# Patient Record
Sex: Male | Born: 1945 | Race: White | Hispanic: No | Marital: Married | Smoking: Former smoker
Health system: Southern US, Community
[De-identification: ages and names within clinical notes are randomized; demographics above are authoritative.]

## PROBLEM LIST (undated history)

## (undated) DIAGNOSIS — I219 Acute myocardial infarction, unspecified: Secondary | ICD-10-CM

## (undated) DIAGNOSIS — I509 Heart failure, unspecified: Secondary | ICD-10-CM

## (undated) DIAGNOSIS — I10 Essential (primary) hypertension: Secondary | ICD-10-CM

## (undated) DIAGNOSIS — E785 Hyperlipidemia, unspecified: Secondary | ICD-10-CM

## (undated) DIAGNOSIS — M1990 Unspecified osteoarthritis, unspecified site: Secondary | ICD-10-CM

## (undated) HISTORY — DX: Hyperlipidemia, unspecified: E78.5

## (undated) HISTORY — DX: Heart failure, unspecified: I50.9

## (undated) HISTORY — DX: Essential (primary) hypertension: I10

## (undated) HISTORY — DX: Unspecified osteoarthritis, unspecified site: M19.90

---

## 2004-11-17 ENCOUNTER — Emergency Department: Payer: Self-pay | Admitting: Emergency Medicine

## 2006-04-25 ENCOUNTER — Ambulatory Visit: Payer: Self-pay

## 2006-05-15 ENCOUNTER — Ambulatory Visit: Payer: Self-pay | Admitting: Surgery

## 2006-08-28 HISTORY — PX: CORONARY ARTERY BYPASS GRAFT: SHX141

## 2006-12-26 ENCOUNTER — Ambulatory Visit: Payer: Self-pay | Admitting: Internal Medicine

## 2006-12-27 ENCOUNTER — Ambulatory Visit: Payer: Self-pay | Admitting: Internal Medicine

## 2006-12-29 ENCOUNTER — Emergency Department: Payer: Self-pay | Admitting: General Practice

## 2006-12-29 ENCOUNTER — Other Ambulatory Visit: Payer: Self-pay

## 2007-03-12 ENCOUNTER — Ambulatory Visit: Payer: Self-pay | Admitting: Internal Medicine

## 2009-09-29 ENCOUNTER — Inpatient Hospital Stay: Payer: Self-pay | Admitting: Internal Medicine

## 2010-03-16 ENCOUNTER — Encounter: Admission: RE | Admit: 2010-03-16 | Discharge: 2010-03-16 | Payer: Self-pay | Admitting: Occupational Medicine

## 2011-02-12 ENCOUNTER — Emergency Department: Payer: Self-pay | Admitting: Emergency Medicine

## 2011-06-02 ENCOUNTER — Ambulatory Visit: Payer: Self-pay | Admitting: Family Medicine

## 2012-03-27 ENCOUNTER — Ambulatory Visit: Payer: Self-pay | Admitting: Family Medicine

## 2015-02-10 ENCOUNTER — Ambulatory Visit (INDEPENDENT_AMBULATORY_CARE_PROVIDER_SITE_OTHER): Payer: Medicare Other | Admitting: Family Medicine

## 2015-02-10 ENCOUNTER — Encounter: Payer: Self-pay | Admitting: Family Medicine

## 2015-02-10 VITALS — BP 142/70 | HR 64 | Temp 98.0°F | Resp 16 | Ht 64.0 in | Wt 179.2 lb

## 2015-02-10 DIAGNOSIS — Z1389 Encounter for screening for other disorder: Secondary | ICD-10-CM

## 2015-02-10 DIAGNOSIS — I480 Paroxysmal atrial fibrillation: Secondary | ICD-10-CM | POA: Insufficient documentation

## 2015-02-10 DIAGNOSIS — E785 Hyperlipidemia, unspecified: Secondary | ICD-10-CM | POA: Insufficient documentation

## 2015-02-10 DIAGNOSIS — Z951 Presence of aortocoronary bypass graft: Secondary | ICD-10-CM | POA: Insufficient documentation

## 2015-02-10 DIAGNOSIS — R600 Localized edema: Secondary | ICD-10-CM | POA: Insufficient documentation

## 2015-02-10 DIAGNOSIS — M1990 Unspecified osteoarthritis, unspecified site: Secondary | ICD-10-CM | POA: Insufficient documentation

## 2015-02-10 DIAGNOSIS — I10 Essential (primary) hypertension: Secondary | ICD-10-CM | POA: Insufficient documentation

## 2015-02-10 DIAGNOSIS — I509 Heart failure, unspecified: Secondary | ICD-10-CM | POA: Insufficient documentation

## 2015-02-10 DIAGNOSIS — J40 Bronchitis, not specified as acute or chronic: Secondary | ICD-10-CM

## 2015-02-10 MED ORDER — HYDROCODONE-HOMATROPINE 5-1.5 MG/5ML PO SYRP: 5.0000 mL | ORAL_SOLUTION | Freq: Four times a day (QID) | ORAL | Status: DC | PRN

## 2015-02-10 MED ORDER — MINOCYCLINE HCL 100 MG PO CAPS
100.0000 mg | ORAL_CAPSULE | Freq: Two times a day (BID) | ORAL | Status: DC
Start: 2015-02-10 — End: 2015-03-10

## 2015-02-10 NOTE — Patient Instructions (Signed)
May add Mucinex

## 2015-02-10 NOTE — Progress Notes (Signed)
Subjective:     Patient ID: DURANTE VIOLETT, male   DOB: 1946-05-16, 69 y.o.   MRN: [id number]  HPI  Chief Complaint  Patient presents with  . Cough    patient comes in office today with concerns of cough for the past month and a half. Associated symptoms include; shortness of breath, runny nose and itching of throat. Patient has tried taking OTC Tylenol and Claritin  Reports cough is productive of purulent to clear sputum. States he has mild clear nasal congestion.    Review of Systems  Constitutional: Negative for fever and chills.       Objective:   Physical Exam Ears: obscured by cerumen Throat: no tonsillar enlargement or exudate Neck: no cervical adenopathy Lungs: clear    Assessment:     1. Bronchitis - minocycline (MINOCIN,DYNACIN) 100 MG capsule; Take 1 capsule (100 mg total) by mouth 2 (two) times daily.  Dispense: 20 capsule; Refill: 0 - HYDROcodone-homatropine (HYCODAN) 5-1.5 MG/5ML syrup; Take 5 mLs by mouth every 6 (six) hours as needed for cough.  Dispense: 240 mL; Refill: 0    Plan:    will f/u HTN in 4 weeks

## 2015-03-10 ENCOUNTER — Ambulatory Visit (INDEPENDENT_AMBULATORY_CARE_PROVIDER_SITE_OTHER): Payer: Medicare Other | Admitting: Family Medicine

## 2015-03-10 ENCOUNTER — Encounter: Payer: Self-pay | Admitting: Family Medicine

## 2015-03-10 VITALS — BP 148/86 | HR 60 | Temp 98.5°F | Resp 16 | Wt 179.4 lb

## 2015-03-10 DIAGNOSIS — J40 Bronchitis, not specified as acute or chronic: Secondary | ICD-10-CM | POA: Diagnosis not present

## 2015-03-10 DIAGNOSIS — E785 Hyperlipidemia, unspecified: Secondary | ICD-10-CM | POA: Diagnosis not present

## 2015-03-10 DIAGNOSIS — Z951 Presence of aortocoronary bypass graft: Secondary | ICD-10-CM

## 2015-03-10 DIAGNOSIS — I10 Essential (primary) hypertension: Secondary | ICD-10-CM | POA: Diagnosis not present

## 2015-03-10 MED ORDER — ATORVASTATIN CALCIUM 40 MG PO TABS: 40.0000 mg | ORAL_TABLET | Freq: Every day | ORAL | Status: AC

## 2015-03-10 MED ORDER — LISINOPRIL 20 MG PO TABS: 20.0000 mg | ORAL_TABLET | Freq: Every day | ORAL | Status: AC

## 2015-03-10 MED ORDER — HYDROCODONE-HOMATROPINE 5-1.5 MG/5ML PO SYRP: 5.0000 mL | ORAL_SOLUTION | Freq: Four times a day (QID) | ORAL | Status: DC | PRN

## 2015-03-10 NOTE — Patient Instructions (Addendum)
Follow up with cardiology. Resume medication daily

## 2015-03-10 NOTE — Progress Notes (Signed)
Subjective:     Patient ID: Patrick Sexton, male   DOB: 1946-06-24, 69 y.o.   MRN: [id number]  HPI  Chief Complaint  Patient presents with  . Bronchitis    patient is present to follow up from last office visit on 6/15. Patient was diagnosed with bronchitis and prescribed Hydrocone-Homatropine and Minocycline 100mg .   . Hypertension    patient is here for four week follow up for blood pressure, last blood pressure in house was 142/70  States his bronchitis has nearly resolved with clearing of purulent sputum Continues to have occasional cough and wishes a refill on cough syrup. Reports he does not take blood pressure or cholesterol medication daily. Prefers to take it every other day as a way of warding off presumed long term side effects.   Review of Systems  Cardiovascular:       Reports increased shortness of breath when taking his trash out. Has not had any recent cardiology evaluation after CABG x 4 in 2008.       Objective:   Physical Exam  Constitutional: He appears well-developed and well-nourished. No distress.  Cardiovascular:  Murmur (2/6 systolic murmur) heard. Pulmonary/Chest: Breath sounds normal.  Musculoskeletal: He exhibits no edema (in lower extremities).       Assessment:    1. HLD (hyperlipidemia) - atorvastatin (LIPITOR) 40 MG tablet; Take 1 tablet (40 mg total) by mouth daily.  Dispense: 30 tablet; Refill: 11  2. Essential hypertension - lisinopril (PRINIVIL,ZESTRIL) 20 MG tablet; Take 1 tablet (20 mg total) by mouth daily.  Dispense: 30 tablet; Refill: 5  3. Bronchitis - HYDROcodone-homatropine (HYCODAN) 5-1.5 MG/5ML syrup; Take 5 mLs by mouth every 6 (six) hours as needed for cough.  Dispense: 120 mL; Refill: 0  4. H/O coronary artery bypass surgery - Ambulatory referral to Cardiology     Plan:    Resume daily medication

## 2015-05-19 ENCOUNTER — Telehealth: Payer: Self-pay | Admitting: Family Medicine

## 2015-05-19 ENCOUNTER — Encounter: Payer: Medicare Other | Admitting: Family Medicine

## 2015-05-26 ENCOUNTER — Encounter: Payer: Self-pay | Admitting: Family Medicine

## 2015-05-26 ENCOUNTER — Ambulatory Visit (INDEPENDENT_AMBULATORY_CARE_PROVIDER_SITE_OTHER): Payer: Medicare Other | Admitting: Family Medicine

## 2015-05-26 VITALS — BP 120/62 | HR 57 | Temp 98.2°F | Resp 14 | Wt 175.6 lb

## 2015-05-26 DIAGNOSIS — J069 Acute upper respiratory infection, unspecified: Secondary | ICD-10-CM

## 2015-05-26 MED ORDER — HYDROCODONE-HOMATROPINE 5-1.5 MG/5ML PO SYRP: 5.0000 mL | ORAL_SOLUTION | Freq: Four times a day (QID) | ORAL | Status: AC | PRN

## 2015-05-26 NOTE — Patient Instructions (Signed)
May also use Delsym for cough.

## 2015-05-26 NOTE — Progress Notes (Signed)
Subjective:     Patient ID: Patrick Sexton, male   DOB: [birth year], 69 y.o.   MRN: [id number]  HPI  Chief Complaint  Patient presents with  . Cough    Patient comes in office today with concerns of cough and congestion for the past 4-5 days. Patient states that cough is productive and he has had shortness of breath. Patient has tried otc cough drops  Reports onset of cold sx 4-5 days ago.   Review of Systems  Constitutional: Negative for fever and chills.       Objective:   Physical Exam  Constitutional: He appears well-developed and well-nourished. No distress.  Ears: T.M's intact without inflammation Throat: tonsils absent, no erythema Neck: no cervical adenopathy Lungs: clear     Assessment:    1. Upper respiratory infection - HYDROcodone-homatropine (HYCODAN) 5-1.5 MG/5ML syrup; Take 5 mLs by mouth every 6 (six) hours as needed for cough.  Dispense: 240 mL; Refill: 0    Plan:    Discussed natural hx of URI. Will call if not continuing to improve.

## 2015-09-04 ENCOUNTER — Emergency Department (HOSPITAL_COMMUNITY): Payer: Medicare Other

## 2015-09-04 ENCOUNTER — Encounter (HOSPITAL_COMMUNITY): Payer: Self-pay | Admitting: Emergency Medicine

## 2015-09-04 ENCOUNTER — Inpatient Hospital Stay (HOSPITAL_COMMUNITY): Payer: Medicare Other

## 2015-09-04 ENCOUNTER — Encounter (HOSPITAL_COMMUNITY): Admission: EM | Disposition: E | Payer: Self-pay | Source: Home / Self Care | Attending: Pulmonary Disease

## 2015-09-04 ENCOUNTER — Inpatient Hospital Stay (HOSPITAL_COMMUNITY)
Admission: EM | Admit: 2015-09-04 | Discharge: 2015-09-29 | DRG: 286 | Disposition: E | Payer: Medicare Other | Attending: Internal Medicine | Admitting: Internal Medicine

## 2015-09-04 DIAGNOSIS — E785 Hyperlipidemia, unspecified: Secondary | ICD-10-CM | POA: Diagnosis present

## 2015-09-04 DIAGNOSIS — J930 Spontaneous tension pneumothorax: Secondary | ICD-10-CM | POA: Diagnosis not present

## 2015-09-04 DIAGNOSIS — I25810 Atherosclerosis of coronary artery bypass graft(s) without angina pectoris: Secondary | ICD-10-CM | POA: Diagnosis present

## 2015-09-04 DIAGNOSIS — R7989 Other specified abnormal findings of blood chemistry: Secondary | ICD-10-CM | POA: Diagnosis not present

## 2015-09-04 DIAGNOSIS — G931 Anoxic brain damage, not elsewhere classified: Secondary | ICD-10-CM | POA: Diagnosis present

## 2015-09-04 DIAGNOSIS — E1165 Type 2 diabetes mellitus with hyperglycemia: Secondary | ICD-10-CM | POA: Diagnosis present

## 2015-09-04 DIAGNOSIS — E872 Acidosis, unspecified: Secondary | ICD-10-CM | POA: Insufficient documentation

## 2015-09-04 DIAGNOSIS — E0781 Sick-euthyroid syndrome: Secondary | ICD-10-CM | POA: Diagnosis present

## 2015-09-04 DIAGNOSIS — E874 Mixed disorder of acid-base balance: Secondary | ICD-10-CM | POA: Diagnosis not present

## 2015-09-04 DIAGNOSIS — N179 Acute kidney failure, unspecified: Secondary | ICD-10-CM | POA: Diagnosis not present

## 2015-09-04 DIAGNOSIS — D649 Anemia, unspecified: Secondary | ICD-10-CM | POA: Diagnosis not present

## 2015-09-04 DIAGNOSIS — G9340 Encephalopathy, unspecified: Secondary | ICD-10-CM | POA: Diagnosis present

## 2015-09-04 DIAGNOSIS — I462 Cardiac arrest due to underlying cardiac condition: Secondary | ICD-10-CM | POA: Diagnosis present

## 2015-09-04 DIAGNOSIS — E870 Hyperosmolality and hypernatremia: Secondary | ICD-10-CM | POA: Diagnosis not present

## 2015-09-04 DIAGNOSIS — I481 Persistent atrial fibrillation: Secondary | ICD-10-CM | POA: Diagnosis not present

## 2015-09-04 DIAGNOSIS — Z452 Encounter for adjustment and management of vascular access device: Secondary | ICD-10-CM

## 2015-09-04 DIAGNOSIS — E876 Hypokalemia: Secondary | ICD-10-CM | POA: Diagnosis not present

## 2015-09-04 DIAGNOSIS — I447 Left bundle-branch block, unspecified: Secondary | ICD-10-CM | POA: Diagnosis present

## 2015-09-04 DIAGNOSIS — J9801 Acute bronchospasm: Secondary | ICD-10-CM | POA: Diagnosis not present

## 2015-09-04 DIAGNOSIS — I2582 Chronic total occlusion of coronary artery: Secondary | ICD-10-CM | POA: Diagnosis present

## 2015-09-04 DIAGNOSIS — T17990A Other foreign object in respiratory tract, part unspecified in causing asphyxiation, initial encounter: Secondary | ICD-10-CM | POA: Diagnosis not present

## 2015-09-04 DIAGNOSIS — I5043 Acute on chronic combined systolic (congestive) and diastolic (congestive) heart failure: Secondary | ICD-10-CM | POA: Diagnosis present

## 2015-09-04 DIAGNOSIS — D473 Essential (hemorrhagic) thrombocythemia: Secondary | ICD-10-CM | POA: Diagnosis not present

## 2015-09-04 DIAGNOSIS — I130 Hypertensive heart and chronic kidney disease with heart failure and stage 1 through stage 4 chronic kidney disease, or unspecified chronic kidney disease: Secondary | ICD-10-CM | POA: Diagnosis present

## 2015-09-04 DIAGNOSIS — J151 Pneumonia due to Pseudomonas: Secondary | ICD-10-CM | POA: Diagnosis not present

## 2015-09-04 DIAGNOSIS — J9600 Acute respiratory failure, unspecified whether with hypoxia or hypercapnia: Secondary | ICD-10-CM | POA: Diagnosis not present

## 2015-09-04 DIAGNOSIS — Z87891 Personal history of nicotine dependence: Secondary | ICD-10-CM

## 2015-09-04 DIAGNOSIS — I63133 Cerebral infarction due to embolism of bilateral carotid arteries: Secondary | ICD-10-CM | POA: Diagnosis not present

## 2015-09-04 DIAGNOSIS — E878 Other disorders of electrolyte and fluid balance, not elsewhere classified: Secondary | ICD-10-CM | POA: Diagnosis not present

## 2015-09-04 DIAGNOSIS — R402312 Coma scale, best motor response, none, at arrival to emergency department: Secondary | ICD-10-CM | POA: Diagnosis present

## 2015-09-04 DIAGNOSIS — E2740 Unspecified adrenocortical insufficiency: Secondary | ICD-10-CM | POA: Diagnosis not present

## 2015-09-04 DIAGNOSIS — K7200 Acute and subacute hepatic failure without coma: Secondary | ICD-10-CM | POA: Diagnosis not present

## 2015-09-04 DIAGNOSIS — R001 Bradycardia, unspecified: Secondary | ICD-10-CM | POA: Diagnosis not present

## 2015-09-04 DIAGNOSIS — J690 Pneumonitis due to inhalation of food and vomit: Secondary | ICD-10-CM | POA: Diagnosis not present

## 2015-09-04 DIAGNOSIS — I480 Paroxysmal atrial fibrillation: Secondary | ICD-10-CM | POA: Diagnosis present

## 2015-09-04 DIAGNOSIS — R6521 Severe sepsis with septic shock: Secondary | ICD-10-CM | POA: Diagnosis not present

## 2015-09-04 DIAGNOSIS — J9611 Chronic respiratory failure with hypoxia: Secondary | ICD-10-CM | POA: Diagnosis not present

## 2015-09-04 DIAGNOSIS — I469 Cardiac arrest, cause unspecified: Secondary | ICD-10-CM | POA: Diagnosis present

## 2015-09-04 DIAGNOSIS — A419 Sepsis, unspecified organism: Secondary | ICD-10-CM | POA: Diagnosis not present

## 2015-09-04 DIAGNOSIS — R319 Hematuria, unspecified: Secondary | ICD-10-CM | POA: Diagnosis not present

## 2015-09-04 DIAGNOSIS — R570 Cardiogenic shock: Secondary | ICD-10-CM | POA: Diagnosis not present

## 2015-09-04 DIAGNOSIS — I471 Supraventricular tachycardia: Secondary | ICD-10-CM | POA: Diagnosis present

## 2015-09-04 DIAGNOSIS — J189 Pneumonia, unspecified organism: Secondary | ICD-10-CM

## 2015-09-04 DIAGNOSIS — Z66 Do not resuscitate: Secondary | ICD-10-CM

## 2015-09-04 DIAGNOSIS — R778 Other specified abnormalities of plasma proteins: Secondary | ICD-10-CM | POA: Insufficient documentation

## 2015-09-04 DIAGNOSIS — I10 Essential (primary) hypertension: Secondary | ICD-10-CM | POA: Diagnosis present

## 2015-09-04 DIAGNOSIS — I2510 Atherosclerotic heart disease of native coronary artery without angina pectoris: Secondary | ICD-10-CM | POA: Diagnosis present

## 2015-09-04 DIAGNOSIS — Z9689 Presence of other specified functional implants: Secondary | ICD-10-CM

## 2015-09-04 DIAGNOSIS — R402212 Coma scale, best verbal response, none, at arrival to emergency department: Secondary | ICD-10-CM | POA: Diagnosis present

## 2015-09-04 DIAGNOSIS — J150 Pneumonia due to Klebsiella pneumoniae: Secondary | ICD-10-CM | POA: Diagnosis not present

## 2015-09-04 DIAGNOSIS — Z6829 Body mass index (BMI) 29.0-29.9, adult: Secondary | ICD-10-CM | POA: Diagnosis not present

## 2015-09-04 DIAGNOSIS — Z978 Presence of other specified devices: Secondary | ICD-10-CM

## 2015-09-04 DIAGNOSIS — J9690 Respiratory failure, unspecified, unspecified whether with hypoxia or hypercapnia: Secondary | ICD-10-CM | POA: Insufficient documentation

## 2015-09-04 DIAGNOSIS — I5023 Acute on chronic systolic (congestive) heart failure: Secondary | ICD-10-CM | POA: Diagnosis not present

## 2015-09-04 DIAGNOSIS — R748 Abnormal levels of other serum enzymes: Secondary | ICD-10-CM | POA: Diagnosis present

## 2015-09-04 DIAGNOSIS — J9811 Atelectasis: Secondary | ICD-10-CM | POA: Diagnosis not present

## 2015-09-04 DIAGNOSIS — R0902 Hypoxemia: Secondary | ICD-10-CM

## 2015-09-04 DIAGNOSIS — E871 Hypo-osmolality and hyponatremia: Secondary | ICD-10-CM | POA: Diagnosis not present

## 2015-09-04 DIAGNOSIS — I6310 Cerebral infarction due to embolism of unspecified precerebral artery: Secondary | ICD-10-CM | POA: Diagnosis not present

## 2015-09-04 DIAGNOSIS — Z515 Encounter for palliative care: Secondary | ICD-10-CM | POA: Diagnosis not present

## 2015-09-04 DIAGNOSIS — Z951 Presence of aortocoronary bypass graft: Secondary | ICD-10-CM

## 2015-09-04 DIAGNOSIS — Z79899 Other long term (current) drug therapy: Secondary | ICD-10-CM

## 2015-09-04 DIAGNOSIS — Z7982 Long term (current) use of aspirin: Secondary | ICD-10-CM | POA: Diagnosis not present

## 2015-09-04 DIAGNOSIS — I252 Old myocardial infarction: Secondary | ICD-10-CM

## 2015-09-04 DIAGNOSIS — J9601 Acute respiratory failure with hypoxia: Secondary | ICD-10-CM | POA: Diagnosis not present

## 2015-09-04 DIAGNOSIS — Z938 Other artificial opening status: Secondary | ICD-10-CM

## 2015-09-04 DIAGNOSIS — I255 Ischemic cardiomyopathy: Secondary | ICD-10-CM | POA: Diagnosis present

## 2015-09-04 DIAGNOSIS — R34 Anuria and oliguria: Secondary | ICD-10-CM | POA: Diagnosis not present

## 2015-09-04 DIAGNOSIS — Z4659 Encounter for fitting and adjustment of other gastrointestinal appliance and device: Secondary | ICD-10-CM

## 2015-09-04 DIAGNOSIS — N182 Chronic kidney disease, stage 2 (mild): Secondary | ICD-10-CM | POA: Diagnosis present

## 2015-09-04 DIAGNOSIS — J9621 Acute and chronic respiratory failure with hypoxia: Secondary | ICD-10-CM | POA: Diagnosis not present

## 2015-09-04 DIAGNOSIS — R64 Cachexia: Secondary | ICD-10-CM | POA: Diagnosis present

## 2015-09-04 DIAGNOSIS — I4901 Ventricular fibrillation: Principal | ICD-10-CM | POA: Diagnosis present

## 2015-09-04 DIAGNOSIS — I6330 Cerebral infarction due to thrombosis of unspecified cerebral artery: Secondary | ICD-10-CM | POA: Insufficient documentation

## 2015-09-04 DIAGNOSIS — R402112 Coma scale, eyes open, never, at arrival to emergency department: Secondary | ICD-10-CM | POA: Diagnosis present

## 2015-09-04 DIAGNOSIS — I4891 Unspecified atrial fibrillation: Secondary | ICD-10-CM | POA: Diagnosis not present

## 2015-09-04 DIAGNOSIS — Z9289 Personal history of other medical treatment: Secondary | ICD-10-CM

## 2015-09-04 HISTORY — PX: CARDIAC CATHETERIZATION: SHX172

## 2015-09-04 HISTORY — DX: Acute myocardial infarction, unspecified: I21.9

## 2015-09-04 LAB — PROTIME-INR
INR: 1.38 (ref 0.00–1.49)
INR: 1.65 — ABNORMAL HIGH (ref 0.00–1.49)
Prothrombin Time: 17.1 seconds — ABNORMAL HIGH (ref 11.6–15.2)
Prothrombin Time: 19.5 seconds — ABNORMAL HIGH (ref 11.6–15.2)

## 2015-09-04 LAB — CBC WITH DIFFERENTIAL/PLATELET
BASOS ABS: 0.1 10*3/uL (ref 0.0–0.1)
Basophils Relative: 0 %
EOS ABS: 0.4 10*3/uL (ref 0.0–0.7)
EOS PCT: 2 %
HCT: 40.6 % (ref 39.0–52.0)
HEMOGLOBIN: 13 g/dL (ref 13.0–17.0)
Lymphocytes Relative: 10 %
Lymphs Abs: 2 10*3/uL (ref 0.7–4.0)
MCH: 25.9 pg — ABNORMAL LOW (ref 26.0–34.0)
MCHC: 32 g/dL (ref 30.0–36.0)
MCV: 80.9 fL (ref 78.0–100.0)
Monocytes Absolute: 1 10*3/uL (ref 0.1–1.0)
Monocytes Relative: 5 %
NEUTROS PCT: 83 %
Neutro Abs: 17.9 10*3/uL — ABNORMAL HIGH (ref 1.7–7.7)
PLATELETS: 411 10*3/uL — AB (ref 150–400)
RBC: 5.02 MIL/uL (ref 4.22–5.81)
RDW: 14.8 % (ref 11.5–15.5)
WBC: 21.3 10*3/uL — AB (ref 4.0–10.5)

## 2015-09-04 LAB — POCT I-STAT 3, ART BLOOD GAS (G3+)
ACID-BASE DEFICIT: 10 mmol/L — AB (ref 0.0–2.0)
ACID-BASE DEFICIT: 10 mmol/L — AB (ref 0.0–2.0)
Acid-base deficit: 10 mmol/L — ABNORMAL HIGH (ref 0.0–2.0)
Bicarbonate: 18.6 mEq/L — ABNORMAL LOW (ref 20.0–24.0)
Bicarbonate: 16 mEq/L — ABNORMAL LOW (ref 20.0–24.0)
Bicarbonate: 14.9 mEq/L — ABNORMAL LOW (ref 20.0–24.0)
O2 SAT: 86 %
O2 SAT: 90 %
O2 SAT: 97 %
PCO2 ART: 49.4 mmHg — AB (ref 35.0–45.0)
PCO2 ART: 25.2 mmHg — AB (ref 35.0–45.0)
PH ART: 7.184 — AB (ref 7.350–7.450)
PH ART: 7.361 (ref 7.350–7.450)
PO2 ART: 57 mmHg — AB (ref 80.0–100.0)
TCO2: 20 mmol/L (ref 0–100)
TCO2: 17 mmol/L (ref 0–100)
TCO2: 16 mmol/L (ref 0–100)
pCO2 arterial: 31.7 mmHg — ABNORMAL LOW (ref 35.0–45.0)
pH, Arterial: 7.3 — ABNORMAL LOW (ref 7.350–7.450)
pO2, Arterial: 64 mmHg — ABNORMAL LOW (ref 80.0–100.0)
pO2, Arterial: 76 mmHg — ABNORMAL LOW (ref 80.0–100.0)

## 2015-09-04 LAB — CBC
HCT: 46.1 % (ref 39.0–52.0)
Hemoglobin: 14.6 g/dL (ref 13.0–17.0)
MCH: 25.5 pg — AB (ref 26.0–34.0)
MCHC: 31.7 g/dL (ref 30.0–36.0)
MCV: 80.5 fL (ref 78.0–100.0)
PLATELETS: 471 10*3/uL — AB (ref 150–400)
RBC: 5.73 MIL/uL (ref 4.22–5.81)
RDW: 14.7 % (ref 11.5–15.5)
WBC: 17.9 10*3/uL — AB (ref 4.0–10.5)

## 2015-09-04 LAB — BASIC METABOLIC PANEL
ANION GAP: 13 (ref 5–15)
Anion gap: 11 (ref 5–15)
BUN: 19 mg/dL (ref 6–20)
BUN: 20 mg/dL (ref 6–20)
CHLORIDE: 107 mmol/L (ref 101–111)
CO2: 17 mmol/L — ABNORMAL LOW (ref 22–32)
CO2: 22 mmol/L (ref 22–32)
Calcium: 7.9 mg/dL — ABNORMAL LOW (ref 8.9–10.3)
Calcium: 7.4 mg/dL — ABNORMAL LOW (ref 8.9–10.3)
Chloride: 104 mmol/L (ref 101–111)
Creatinine, Ser: 0.88 mg/dL (ref 0.61–1.24)
Creatinine, Ser: 1.09 mg/dL (ref 0.61–1.24)
Glucose, Bld: 218 mg/dL — ABNORMAL HIGH (ref 65–99)
Glucose, Bld: 311 mg/dL — ABNORMAL HIGH (ref 65–99)
POTASSIUM: 3.5 mmol/L (ref 3.5–5.1)
POTASSIUM: 3.4 mmol/L — AB (ref 3.5–5.1)
SODIUM: 137 mmol/L (ref 135–145)
SODIUM: 137 mmol/L (ref 135–145)

## 2015-09-04 LAB — APTT: aPTT: 156 seconds — ABNORMAL HIGH (ref 24–37)

## 2015-09-04 LAB — COMPREHENSIVE METABOLIC PANEL WITH GFR
ALT: 137 U/L — ABNORMAL HIGH (ref 17–63)
AST: 145 U/L — ABNORMAL HIGH (ref 15–41)
Albumin: 3.2 g/dL — ABNORMAL LOW (ref 3.5–5.0)
Alkaline Phosphatase: 87 U/L (ref 38–126)
Anion gap: 16 — ABNORMAL HIGH (ref 5–15)
BUN: 20 mg/dL (ref 6–20)
CO2: 18 mmol/L — ABNORMAL LOW (ref 22–32)
Calcium: 8.7 mg/dL — ABNORMAL LOW (ref 8.9–10.3)
Chloride: 105 mmol/L (ref 101–111)
Creatinine, Ser: 1.19 mg/dL (ref 0.61–1.24)
GFR calc Af Amer: >60 mL/min
GFR calc non Af Amer: >60 mL/min
Glucose, Bld: 172 mg/dL — ABNORMAL HIGH (ref 65–99)
Potassium: 4.4 mmol/L (ref 3.5–5.1)
Sodium: 139 mmol/L (ref 135–145)
Total Bilirubin: 0.8 mg/dL (ref 0.3–1.2)
Total Protein: 6.3 g/dL — ABNORMAL LOW (ref 6.5–8.1)

## 2015-09-04 LAB — I-STAT CHEM 8, ED
BUN: 25 mg/dL — ABNORMAL HIGH (ref 6–20)
Calcium, Ion: 1.12 mmol/L — ABNORMAL LOW (ref 1.13–1.30)
Chloride: 105 mmol/L (ref 101–111)
Creatinine, Ser: 1 mg/dL (ref 0.61–1.24)
Glucose, Bld: 171 mg/dL — ABNORMAL HIGH (ref 65–99)
HCT: 43 % (ref 39.0–52.0)
Hemoglobin: 14.6 g/dL (ref 13.0–17.0)
Potassium: 4.3 mmol/L (ref 3.5–5.1)
Sodium: 140 mmol/L (ref 135–145)
TCO2: 20 mmol/L (ref 0–100)

## 2015-09-04 LAB — I-STAT ARTERIAL BLOOD GAS, ED
Acid-base deficit: 8 mmol/L — ABNORMAL HIGH (ref 0.0–2.0)
Bicarbonate: 19 meq/L — ABNORMAL LOW (ref 20.0–24.0)
O2 Saturation: 99 %
TCO2: 20 mmol/L (ref 0–100)
pCO2 arterial: 44.9 mmHg (ref 35.0–45.0)
pH, Arterial: 7.235 — ABNORMAL LOW (ref 7.350–7.450)
pO2, Arterial: 192 mmHg — ABNORMAL HIGH (ref 80.0–100.0)

## 2015-09-04 LAB — I-STAT TROPONIN, ED: TROPONIN I, POC: 0.14 ng/mL — AB (ref 0.00–0.08)

## 2015-09-04 LAB — BRAIN NATRIURETIC PEPTIDE: B Natriuretic Peptide: 235.5 pg/mL — ABNORMAL HIGH (ref 0.0–100.0)

## 2015-09-04 LAB — POCT ACTIVATED CLOTTING TIME: ACTIVATED CLOTTING TIME: 239 s

## 2015-09-04 LAB — I-STAT CG4 LACTIC ACID, ED: LACTIC ACID, VENOUS: 7.63 mmol/L — AB (ref 0.5–2.0)

## 2015-09-04 LAB — PHOSPHORUS: PHOSPHORUS: 5.9 mg/dL — AB (ref 2.5–4.6)

## 2015-09-04 LAB — PROCALCITONIN: Procalcitonin: 0.29 ng/mL

## 2015-09-04 LAB — CORTISOL: Cortisol, Plasma: 11.8 ug/dL

## 2015-09-04 LAB — MAGNESIUM: Magnesium: 1.8 mg/dL (ref 1.7–2.4)

## 2015-09-04 LAB — STREP PNEUMONIAE URINARY ANTIGEN: STREP PNEUMO URINARY ANTIGEN: NEGATIVE

## 2015-09-04 LAB — LACTIC ACID, PLASMA: LACTIC ACID, VENOUS: 3.5 mmol/L — AB (ref 0.5–2.0)

## 2015-09-04 LAB — TROPONIN I
TROPONIN I: 5.96 ng/mL — AB (ref <0.031)
TROPONIN I: 16.91 ng/mL — AB (ref <0.031)

## 2015-09-04 SURGERY — LEFT HEART CATH AND CORS/GRAFTS ANGIOGRAPHY

## 2015-09-04 MED ORDER — FENTANYL CITRATE (PF) 100 MCG/2ML IJ SOLN: 50.0000 ug | INTRAMUSCULAR | Status: DC | PRN

## 2015-09-04 MED ORDER — SODIUM CHLORIDE 0.9 % IV SOLN
25.0000 ug/h | INTRAVENOUS | Status: DC
  Administered 2015-09-04: 50 ug/h via INTRAVENOUS
  Administered 2015-09-05: 150 ug/h via INTRAVENOUS
  Administered 2015-09-06: 250 ug/h via INTRAVENOUS
  Filled 2015-09-04 (×4): qty 50

## 2015-09-04 MED ORDER — SODIUM CHLORIDE 0.9 % IV SOLN
1.0000 ug/kg/min | INTRAVENOUS | Status: DC
  Administered 2015-09-04 – 2015-09-05 (×2): 1 ug/kg/min via INTRAVENOUS
  Filled 2015-09-04 (×2): qty 20

## 2015-09-04 MED ORDER — MIDAZOLAM HCL 2 MG/2ML IJ SOLN: 1.0000 mg | INTRAMUSCULAR | Status: DC | PRN

## 2015-09-04 MED ORDER — SODIUM CHLORIDE 0.9 % IJ SOLN: 3.0000 mL | INTRAMUSCULAR | Status: DC | PRN

## 2015-09-04 MED ORDER — ARTIFICIAL TEARS OP OINT
1.0000 | TOPICAL_OINTMENT | Freq: Three times a day (TID) | OPHTHALMIC | Status: DC
Start: 2015-09-04 — End: 2015-09-04

## 2015-09-04 MED ORDER — POTASSIUM CHLORIDE 10 MEQ/50ML IV SOLN
10.0000 meq | INTRAVENOUS | Status: AC
  Administered 2015-09-04 (×2): 10 meq via INTRAVENOUS
  Filled 2015-09-04 (×2): qty 50

## 2015-09-04 MED ORDER — FENTANYL CITRATE (PF) 100 MCG/2ML IJ SOLN
50.0000 ug | Freq: Once | INTRAMUSCULAR | Status: DC
Start: 2015-09-04 — End: 2015-09-06

## 2015-09-04 MED ORDER — AMIODARONE HCL IN DEXTROSE 360-4.14 MG/200ML-% IV SOLN
60.0000 mg/h | INTRAVENOUS | Status: AC
  Administered 2015-09-04: 60 mg/h via INTRAVENOUS
  Filled 2015-09-04 (×2): qty 200

## 2015-09-04 MED ORDER — LIDOCAINE HCL (PF) 1 % IJ SOLN
INTRAMUSCULAR | Status: AC
  Filled 2015-09-04: qty 30

## 2015-09-04 MED ORDER — CISATRACURIUM BOLUS VIA INFUSION
0.0500 mg/kg | INTRAVENOUS | Status: DC | PRN
  Filled 2015-09-04: qty 4

## 2015-09-04 MED ORDER — HEPARIN SODIUM (PORCINE) 1000 UNIT/ML IJ SOLN
INTRAMUSCULAR | Status: AC
  Filled 2015-09-04: qty 1

## 2015-09-04 MED ORDER — ASPIRIN 81 MG PO CHEW
81.0000 mg | CHEWABLE_TABLET | Freq: Every day | ORAL | Status: DC
  Administered 2015-09-04 – 2015-09-21 (×18): 81 mg via ORAL
  Filled 2015-09-04 (×18): qty 1

## 2015-09-04 MED ORDER — PROPOFOL 1000 MG/100ML IV EMUL: 5.0000 ug/kg/min | Freq: Once | INTRAVENOUS | Status: DC

## 2015-09-04 MED ORDER — MIDAZOLAM HCL 2 MG/2ML IJ SOLN: 1.0000 mg | Freq: Once | INTRAMUSCULAR | Status: DC

## 2015-09-04 MED ORDER — MIDAZOLAM HCL 5 MG/ML IJ SOLN
1.0000 mg/h | INTRAMUSCULAR | Status: DC
  Administered 2015-09-04: 1 mg/h via INTRAVENOUS
  Administered 2015-09-05: 3 mg/h via INTRAVENOUS
  Administered 2015-09-06: 4 mg/h via INTRAVENOUS
  Filled 2015-09-04 (×4): qty 10

## 2015-09-04 MED ORDER — PANTOPRAZOLE SODIUM 40 MG IV SOLR
40.0000 mg | Freq: Every day | INTRAVENOUS | Status: DC
  Administered 2015-09-04 – 2015-09-07 (×4): 40 mg via INTRAVENOUS
  Filled 2015-09-04 (×4): qty 40

## 2015-09-04 MED ORDER — MIDAZOLAM BOLUS VIA INFUSION
1.0000 mg | INTRAVENOUS | Status: DC | PRN
  Filled 2015-09-04: qty 1

## 2015-09-04 MED ORDER — SODIUM CHLORIDE 0.9 % IJ SOLN
3.0000 mL | Freq: Two times a day (BID) | INTRAMUSCULAR | Status: DC
  Administered 2015-09-04 – 2015-09-10 (×9): 3 mL via INTRAVENOUS
  Administered 2015-09-11: 10 mL via INTRAVENOUS

## 2015-09-04 MED ORDER — NOREPINEPHRINE BITARTRATE 1 MG/ML IV SOLN
0.0000 ug/min | INTRAVENOUS | Status: DC
  Administered 2015-09-04: 37 ug/min via INTRAVENOUS
  Administered 2015-09-05 (×2): 36 ug/min via INTRAVENOUS
  Administered 2015-09-06: 41 ug/min via INTRAVENOUS
  Administered 2015-09-06: 26 ug/min via INTRAVENOUS
  Administered 2015-09-07: 6 ug/min via INTRAVENOUS
  Filled 2015-09-04 (×9): qty 16

## 2015-09-04 MED ORDER — SODIUM CHLORIDE 0.9 % IV SOLN: 2000.0000 mL | Freq: Once | INTRAVENOUS | Status: DC

## 2015-09-04 MED ORDER — NOREPINEPHRINE BITARTRATE 1 MG/ML IV SOLN
0.0000 ug/min | INTRAVENOUS | Status: DC
  Filled 2015-09-04: qty 4

## 2015-09-04 MED ORDER — METOPROLOL TARTRATE 1 MG/ML IV SOLN
INTRAVENOUS | Status: AC
  Filled 2015-09-04: qty 5

## 2015-09-04 MED ORDER — HEPARIN SODIUM (PORCINE) 1000 UNIT/ML IJ SOLN
INTRAMUSCULAR | Status: DC | PRN
  Administered 2015-09-04: 6000 [IU] via INTRAVENOUS

## 2015-09-04 MED ORDER — NOREPINEPHRINE BITARTRATE 1 MG/ML IV SOLN
0.0000 ug/min | INTRAVENOUS | Status: DC
  Administered 2015-09-04: 12 ug/min via INTRAVENOUS
  Filled 2015-09-04: qty 4

## 2015-09-04 MED ORDER — CISATRACURIUM BESYLATE (PF) 200 MG/20ML IV SOLN
1.0000 ug/kg/min | INTRAVENOUS | Status: DC
  Filled 2015-09-04: qty 20

## 2015-09-04 MED ORDER — LIDOCAINE HCL (PF) 1 % IJ SOLN
INTRAMUSCULAR | Status: DC | PRN
  Administered 2015-09-04: 18:00:00

## 2015-09-04 MED ORDER — AMIODARONE HCL 150 MG/3ML IV SOLN
INTRAVENOUS | Status: AC
  Filled 2015-09-04: qty 3

## 2015-09-04 MED ORDER — IOHEXOL 350 MG/ML SOLN
INTRAVENOUS | Status: DC | PRN
  Administered 2015-09-04: 135 mL via INTRAVENOUS

## 2015-09-04 MED ORDER — ASPIRIN 300 MG RE SUPP
300.0000 mg | RECTAL | Status: AC
  Administered 2015-09-05: 300 mg via RECTAL
  Filled 2015-09-04 (×2): qty 1

## 2015-09-04 MED ORDER — AMIODARONE HCL 150 MG/3ML IV SOLN
INTRAVENOUS | Status: DC | PRN
  Administered 2015-09-04 (×2): 150 mg via INTRAVENOUS

## 2015-09-04 MED ORDER — SODIUM CHLORIDE 0.9 % IV SOLN
1.0000 mg/h | INTRAVENOUS | Status: DC
  Filled 2015-09-04: qty 10

## 2015-09-04 MED ORDER — SODIUM CHLORIDE 0.9 % IV SOLN
250.0000 mL | INTRAVENOUS | Status: DC | PRN
  Administered 2015-09-11: 250 mL via INTRAVENOUS

## 2015-09-04 MED ORDER — CHLORHEXIDINE GLUCONATE 0.12% ORAL RINSE (MEDLINE KIT)
15.0000 mL | Freq: Two times a day (BID) | OROMUCOSAL | Status: DC
  Administered 2015-09-04 – 2015-09-21 (×34): 15 mL via OROMUCOSAL

## 2015-09-04 MED ORDER — ROCURONIUM BROMIDE 50 MG/5ML IV SOLN
100.0000 mg | Freq: Once | INTRAVENOUS | Status: AC
  Administered 2015-09-04: 100 mg via INTRAVENOUS

## 2015-09-04 MED ORDER — SODIUM BICARBONATE 8.4 % IV SOLN
INTRAVENOUS | Status: DC
  Administered 2015-09-04 – 2015-09-05 (×3): via INTRAVENOUS
  Filled 2015-09-04 (×8): qty 150

## 2015-09-04 MED ORDER — PROPOFOL 1000 MG/100ML IV EMUL
INTRAVENOUS | Status: AC
  Filled 2015-09-04: qty 100

## 2015-09-04 MED ORDER — HEPARIN SODIUM (PORCINE) 5000 UNIT/ML IJ SOLN
5000.0000 [IU] | Freq: Three times a day (TID) | INTRAMUSCULAR | Status: DC
  Filled 2015-09-04: qty 1

## 2015-09-04 MED ORDER — FENTANYL CITRATE (PF) 100 MCG/2ML IJ SOLN: 50.0000 ug | Freq: Once | INTRAMUSCULAR | Status: DC

## 2015-09-04 MED ORDER — ARTIFICIAL TEARS OP OINT
1.0000 "application " | TOPICAL_OINTMENT | Freq: Three times a day (TID) | OPHTHALMIC | Status: DC
  Administered 2015-09-04 – 2015-09-06 (×5): 1 via OPHTHALMIC
  Filled 2015-09-04 (×2): qty 3.5

## 2015-09-04 MED ORDER — ANTISEPTIC ORAL RINSE SOLUTION (CORINZ)
7.0000 mL | OROMUCOSAL | Status: DC
  Administered 2015-09-04 – 2015-09-21 (×163): 7 mL via OROMUCOSAL

## 2015-09-04 MED ORDER — HEPARIN SODIUM (PORCINE) 5000 UNIT/ML IJ SOLN
5000.0000 [IU] | Freq: Three times a day (TID) | INTRAMUSCULAR | Status: DC
  Administered 2015-09-04: 5000 [IU] via SUBCUTANEOUS

## 2015-09-04 MED ORDER — SODIUM CHLORIDE 0.9 % IV BOLUS (SEPSIS)
1000.0000 mL | Freq: Once | INTRAVENOUS | Status: AC
  Administered 2015-09-04: 1000 mL via INTRAVENOUS

## 2015-09-04 MED ORDER — ETOMIDATE 2 MG/ML IV SOLN
20.0000 mg | Freq: Once | INTRAVENOUS | Status: AC
  Administered 2015-09-04: 20 mg via INTRAVENOUS

## 2015-09-04 MED ORDER — CISATRACURIUM BOLUS VIA INFUSION
0.1000 mg/kg | Freq: Once | INTRAVENOUS | Status: AC
  Administered 2015-09-04: 7.9 mg via INTRAVENOUS
  Filled 2015-09-04: qty 8

## 2015-09-04 MED ORDER — MAGNESIUM SULFATE 2 GM/50ML IV SOLN
2.0000 g | Freq: Once | INTRAVENOUS | Status: AC
  Administered 2015-09-04: 2 g via INTRAVENOUS
  Filled 2015-09-04: qty 50

## 2015-09-04 MED ORDER — SODIUM CHLORIDE 0.9 % IV SOLN
INTRAVENOUS | Status: DC | PRN
  Administered 2015-09-04: 10 mL/h via INTRAVENOUS

## 2015-09-04 MED ORDER — INSULIN ASPART 100 UNIT/ML ~~LOC~~ SOLN
2.0000 [IU] | SUBCUTANEOUS | Status: DC
  Administered 2015-09-04: 4 [IU] via SUBCUTANEOUS
  Administered 2015-09-05: 6 [IU] via SUBCUTANEOUS

## 2015-09-04 MED ORDER — CISATRACURIUM BOLUS VIA INFUSION
0.1000 mg/kg | Freq: Once | INTRAVENOUS | Status: AC
  Filled 2015-09-04: qty 8

## 2015-09-04 MED ORDER — METOPROLOL TARTRATE 1 MG/ML IV SOLN
INTRAVENOUS | Status: DC | PRN
  Administered 2015-09-04 (×2): 5 mg via INTRAVENOUS

## 2015-09-04 MED ORDER — FENTANYL BOLUS VIA INFUSION
25.0000 ug | INTRAVENOUS | Status: DC | PRN
  Filled 2015-09-04: qty 25

## 2015-09-04 MED ORDER — ACETAMINOPHEN 325 MG PO TABS: 650.0000 mg | ORAL_TABLET | ORAL | Status: DC | PRN

## 2015-09-04 MED ORDER — HEPARIN (PORCINE) IN NACL 2-0.9 UNIT/ML-% IJ SOLN
INTRAMUSCULAR | Status: AC
  Filled 2015-09-04: qty 1000

## 2015-09-04 MED ORDER — SODIUM CHLORIDE 0.9 % IJ SOLN
3.0000 mL | Freq: Two times a day (BID) | INTRAMUSCULAR | Status: DC
  Administered 2015-09-04 – 2015-09-21 (×20): 3 mL via INTRAVENOUS

## 2015-09-04 MED ORDER — SODIUM CHLORIDE 0.9 % IV SOLN
INTRAVENOUS | Status: DC
Start: 2015-09-04 — End: 2015-09-18
  Administered 2015-09-05: 23:00:00 via INTRAVENOUS

## 2015-09-04 MED ORDER — LIDOCAINE HCL (PF) 1 % IJ SOLN
INTRAMUSCULAR | Status: DC | PRN
  Administered 2015-09-04: 30 mL via INTRADERMAL

## 2015-09-04 MED ORDER — SODIUM CHLORIDE 0.9 % IV SOLN
25.0000 ug/h | INTRAVENOUS | Status: DC
  Filled 2015-09-04: qty 50

## 2015-09-04 MED ORDER — SODIUM CHLORIDE 0.9 % IV SOLN
250.0000 mL | INTRAVENOUS | Status: DC | PRN
  Administered 2015-09-04: 500 mL via INTRAVENOUS
  Administered 2015-09-05: 250 mL via INTRAVENOUS

## 2015-09-04 MED ORDER — VASOPRESSIN 20 UNIT/ML IV SOLN
0.0300 [IU]/min | INTRAVENOUS | Status: DC
  Administered 2015-09-04 – 2015-09-05 (×2): 0.03 [IU]/min via INTRAVENOUS
  Filled 2015-09-04 (×2): qty 2

## 2015-09-04 MED ORDER — AMIODARONE HCL IN DEXTROSE 360-4.14 MG/200ML-% IV SOLN
30.0000 mg/h | INTRAVENOUS | Status: DC
  Administered 2015-09-04 – 2015-09-08 (×7): 30 mg/h via INTRAVENOUS
  Filled 2015-09-04 (×7): qty 200

## 2015-09-04 MED ORDER — ONDANSETRON HCL 4 MG/2ML IJ SOLN
4.0000 mg | Freq: Four times a day (QID) | INTRAMUSCULAR | Status: DC | PRN
  Administered 2015-09-14 – 2015-09-15 (×2): 4 mg via INTRAVENOUS
  Filled 2015-09-04 (×2): qty 2

## 2015-09-04 SURGICAL SUPPLY — 14 items
CATH INFINITI 5 FR IM (CATHETERS) ×2 IMPLANT
CATH SITESEER 5F MULTI A 2 (CATHETERS) ×2 IMPLANT
GUIDE CATH RUNWAY 6FR AL 75 (CATHETERS) ×2 IMPLANT
KIT ENCORE 26 ADVANTAGE (KITS) ×2 IMPLANT
KIT ESSENTIALS PG (KITS) ×2 IMPLANT
KIT HEART LEFT (KITS) ×3 IMPLANT
PACK CARDIAC CATHETERIZATION (CUSTOM PROCEDURE TRAY) ×3 IMPLANT
PINNACLE LONG 6F 25CM (SHEATH) ×3
SHEATH INTRO PINNACLE 6F 25CM (SHEATH) IMPLANT
SHEATH PINNACLE 5F 10CM (SHEATH) ×4 IMPLANT
SHEATH PINNACLE 6F 10CM (SHEATH) ×2 IMPLANT
TRANSDUCER W/STOPCOCK (MISCELLANEOUS) ×3 IMPLANT
WIRE COUGAR XT STRL 190CM (WIRE) ×2 IMPLANT
WIRE EMERALD 3MM-J .035X150CM (WIRE) ×2 IMPLANT

## 2015-09-04 NOTE — Progress Notes (Signed)
RT attempted to place arterial line without any success. RN aware and will contact MD.

## 2015-09-04 NOTE — Consult Note (Signed)
Reason for Consult:   Cardiac arrest  Requesting Physician: ED Primary Cardiologist Dr Clayborn Bigness  HPI:   70 y/o Caucasian male with a history of CAD, PAF, HTN, and dyslipidemia. He is followed by Dr Rosanna Randy and Dr Clayborn Bigness. He had CABG at [facility] in [facility] with an LIMA-LAD, SVG-OM2, SVG-PDA, and SVG-Dx1. He had an echo in Aug 2016- EF 45-50%. Myoview Aug 2016- moderate inferior apical ischemia. LOV with Dr Clayborn Bigness was 04/15/15. The pt was in NSR then. Today he reportedly was shoveling, became SOB, nad collapsed. EMS found him to be in VF and shocked him twice (I could not locate these strips) and he had 15 minutes of CPR. The pt arrived in the ED unresponsive and was intubated. He is in AF now with increased VR now.   PMHx:  Past Medical History  Diagnosis Date  . Congestive cardiac failure ([place])   . Arthritis   . Hypertension   . Hyperlipidemia   . MI (myocardial infarction) [facility])     Past Surgical History  Procedure Laterality Date  . Coronary artery bypass graft  2008    SOCHx:  reports that he has quit smoking. His smoking use included Pipe. He started smoking about 30 years ago. He does not have any smokeless tobacco history on file. His alcohol and drug histories are not on file.  FAMHx: No family history on file.  ALLERGIES: No Known Allergies  ROS: Review of Systems: Unobtainable    HOME MEDICATIONS: Prior to Admission medications   Medication Sig Start Date End Date Taking? Authorizing Provider  aspirin EC 81 MG tablet Take by mouth. 04/15/15 04/14/16  Historical Provider, MD  atorvastatin (LIPITOR) 40 MG tablet Take 1 tablet (40 mg total) by mouth daily. 03/10/15   Carmon Ginsberg, PA  HYDROcodone-homatropine [facility]) 5-1.5 MG/5ML syrup Take 5 mLs by mouth every 6 (six) hours as needed for cough. 05/26/15   Carmon Ginsberg, PA  lisinopril (PRINIVIL,ZESTRIL) 20 MG tablet Take 1 tablet (20 mg total) by mouth daily. 03/10/15   Carmon Ginsberg, PA  metoprolol  succinate (TOPROL-XL) 25 MG 24 hr tablet Take by mouth. 04/15/15 04/14/16  Historical Provider, MD    HOSPITAL MEDICATIONS: I have reviewed the patient's current medications.  VITALS: Blood pressure 116/75, pulse 94, temperature 96.3 F (35.7 C), resp. rate 20, height 5\' 9"  (1.753 m), SpO2 99 %.  PHYSICAL EXAM: General appearance: mildly obese and unresponsive, intubated Neck: no carotid bruit and JVP elelvated- pt is flat Lungs: scattered rhonchi Heart: irregularly irregular rhythm Abdomen: soft, non-tender; bowel sounds normal; no masses,  no organomegaly Extremities: trace edema Pulses: 2+ and symmetric Skin: pale, cool, dry Neurologic: unresponsive, intubated  LABS: Results for orders placed or performed during the hospital encounter of 09/16/2015 (from the past 24 hour(s))  CBC with Differential     Status: Abnormal   Collection Time: 09/06/2015  3:20 PM  Result Value Ref Range   WBC 21.3 (H) 4.0 - 10.5 K/uL   RBC 5.02 4.22 - 5.81 MIL/uL   Hemoglobin 13.0 13.0 - 17.0 g/dL   HCT 40.6 39.0 - 52.0 %   MCV 80.9 78.0 - 100.0 fL   MCH 25.9 (L) 26.0 - 34.0 pg   MCHC 32.0 30.0 - 36.0 g/dL   RDW 14.8 11.5 - 15.5 %   Platelets 411 (H) 150 - 400 K/uL   Neutrophils Relative % 83 %   Neutro Abs 17.9 (H) 1.7 - 7.7 K/uL  Lymphocytes Relative 10 %   Lymphs Abs 2.0 0.7 - 4.0 K/uL   Monocytes Relative 5 %   Monocytes Absolute 1.0 0.1 - 1.0 K/uL   Eosinophils Relative 2 %   Eosinophils Absolute 0.4 0.0 - 0.7 K/uL   Basophils Relative 0 %   Basophils Absolute 0.1 0.0 - 0.1 K/uL  I-stat troponin, ED     Status: Abnormal   Collection Time: 09/08/2015  3:20 PM  Result Value Ref Range   Troponin i, poc 0.14 (HH) 0.00 - 0.08 ng/mL   Comment NOTIFIED PHYSICIAN    Comment 3          Protime-INR     Status: Abnormal   Collection Time: 09/15/2015  3:20 PM  Result Value Ref Range   Prothrombin Time 17.1 (H) 11.6 - 15.2 seconds   INR 1.38 0.00 - 1.49  I-stat Chem 8, ED     Status: Abnormal    Collection Time: 09/26/2015  3:22 PM  Result Value Ref Range   Sodium 140 135 - 145 mmol/L   Potassium 4.3 3.5 - 5.1 mmol/L   Chloride 105 101 - 111 mmol/L   BUN 25 (H) 6 - 20 mg/dL   Creatinine, Ser 1.00 0.61 - 1.24 mg/dL   Glucose, Bld 171 (H) 65 - 99 mg/dL   Calcium, Ion 1.12 (L) 1.13 - 1.30 mmol/L   TCO2 20 0 - 100 mmol/L   Hemoglobin 14.6 13.0 - 17.0 g/dL   HCT 43.0 39.0 - 52.0 %  I-Stat CG4 Lactic Acid, ED     Status: Abnormal   Collection Time: 09/13/2015  3:23 PM  Result Value Ref Range   Lactic Acid, Venous 7.63 (HH) 0.5 - 2.0 mmol/L   Comment NOTIFIED PHYSICIAN   I-Stat Arterial Blood Gas, ED - (order at [facility] and [facility] only)     Status: Abnormal   Collection Time: 09/23/2015  3:45 PM  Result Value Ref Range   pH, Arterial 7.235 (L) 7.350 - 7.450   pCO2 arterial 44.9 35.0 - 45.0 mmHg   pO2, Arterial 192.0 (H) 80.0 - 100.0 mmHg   Bicarbonate 19.0 (L) 20.0 - 24.0 mEq/L   TCO2 20 0 - 100 mmol/L   O2 Saturation 99.0 %   Acid-base deficit 8.0 (H) 0.0 - 2.0 mmol/L   Patient temperature HIDE    Sample type ARTERIAL     EKG: AF with LBBB (chronicLBBB)  IMAGING: Dg Chest Port 1 View  09/28/2015  CLINICAL DATA:  Patient with history of cardiac arrest. Status post intubation. EXAM: PORTABLE CHEST 1 VIEW COMPARISON:  Chest radiograph 09/29/2009 FINDINGS: ET tube terminates in the mid trachea. Enteric tube courses inferior to the diaphragm. Pacer pad overlies the chest. Stable enlarged cardiac and mediastinal contours status post median sternotomy and CABG procedure. Diffuse bilateral predominately perihilar interstitial pulmonary opacities. No pleural effusion or pneumothorax. Regional skeleton is unremarkable. IMPRESSION: ET tube terminates in the mid trachea. Perihilar interstitial opacities most compatible with edema. Cardiomegaly. Electronically Signed   By: Lovey Newcomer M.D.   On: 09/09/2015 15:55   Dg Abd Portable 1v  09/16/2015  CLINICAL DATA:  Cardiac arrest, orogastric tube placement  EXAM: PORTABLE ABDOMEN - 1 VIEW COMPARISON:  None. FINDINGS: Orogastric tube projects over the stomach with tip in the region of the antrum. IMPRESSION: OG tube as described Electronically Signed   By: Skipper Cliche M.D.   On: 09/12/2015 15:54    IMPRESSION: Principal Problem:   Cardiac arrest while shoveling snow Active  Problems:   H/O CABG x 4 2008   VF shocked twice in field   Essential hypertension   PAF- NSR at LOV- now in AF with RVR post arrest   HLD (hyperlipidemia)   LBBB (left bundle branch block)   RECOMMENDATION: Pt to go for urgent cath. CCM has started cooling the pt.   Time Spent Directly with Patient: [address] minutes  Kerin Ransom, [place] beeper 09/06/2015, 4:28 PM

## 2015-09-04 NOTE — Progress Notes (Signed)
[place] Progress Note Patient Name: Patrick Sexton [facility] DOB: 09/19/1945 MRN: [id number]   Date of Service  09/22/2015  HPI/Events of Note  Pt admitted via ED with cardiac arrest. Pt now being taken to cath lab  eICU Interventions  See full orders     Intervention Category Major Interventions: Other: Evaluation Type: New Patient Evaluation  Asencion Noble 09/25/2015, 4:27 PM

## 2015-09-04 NOTE — Procedures (Signed)
Arterial Catheter Insertion Procedure Note [place] [id number] 03-26-1946  Procedure: Insertion of Arterial Catheter  Indications: Blood pressure monitoring  Procedure Details Consent: Risks of procedure as well as the alternatives and risks of each were explained to the (patient/caregiver).  Consent for procedure obtained. Time Out: Verified patient identification, verified procedure, site/side was marked, verified correct patient position, special equipment/implants available, medications/allergies/relevent history reviewed, required imaging and test results available.  Performed  Maximum sterile technique was used including antiseptics, cap, gloves, gown, hand hygiene, mask and sheet. Skin prep: Chlorhexidine; local anesthetic administered 20 gauge catheter was inserted into right radial artery using the Seldinger technique.  After multiple attempts by RT, right radial A-line placed under ultrasound guidance. 2 attempts. On 2nd attempt, pulsatile bright red blood. Good waveform. No complications.  Evaluation Blood flow good; BP tracing good. Complications: No apparent complications.   Dannielle Burn, MD 09/03/2015

## 2015-09-04 NOTE — Procedures (Signed)
Central Venous Catheter Insertion Procedure Note [place] [id number] 04/25/1946  Procedure: Insertion of Central Venous Catheter Indications: Drug and/or fluid administration  Procedure Details Consent: Risks of procedure as well as the alternatives and risks of each were explained to the (patient/caregiver).  Consent for procedure obtained. Time Out: Verified patient identification, verified procedure, site/side was marked, verified correct patient position, special equipment/implants available, medications/allergies/relevent history reviewed, required imaging and test results available.  Performed  Maximum sterile technique was used including antiseptics, cap, gloves, gown, hand hygiene, mask and sheet. Skin prep: Chlorhexidine; local anesthetic administered A antimicrobial bonded/coated triple lumen catheter was placed in the left internal jugular vein using the Seldinger technique.  After local anesthesia, the finder needle was advanced into the vessel under ultrasound guidance. Dark red non-pulsatile blood was aspirated. The wire was threaded into the vessel and needle removed. The wire was confirmed in the vein with ultrasound. A nick was made in the skin and the tract was dilated. The catheter was advanced over the wire into the vessel. The wire was removed. All ports aspirated and flushed easily. The catheter was sutured in place and biopatch and dressing were applied. No complications. Post procedure CXR as above. EBL <5cc.   Evaluation Blood flow good Complications: No apparent complications Patient did tolerate procedure well. Chest X-ray ordered to verify placement.  CXR: line in good position overlying the SVC.  Dannielle Burn, MD 09/09/2015, 9:28 PM

## 2015-09-04 NOTE — H&P (Signed)
PULMONARY / CRITICAL CARE MEDICINE   Name: Patrick Sexton [facility] MRN: [id number] DOB: Aug 11, 1946    ADMISSION DATE:  08/29/2015   REFERRING MD:  EDP  CHIEF COMPLAINT:  Cardiac arrest   HISTORY OF PRESENT ILLNESS:   70 yo with hx CAD who was shoveling snow at went down. Fire quickly on scene with CPR and EMS gave epi x 1. He was HD stable and will be taken to CC after CT head completed.  PAST MEDICAL HISTORY :  He  has a past medical history of Congestive cardiac failure ([place]); Arthritis; Hypertension; Hyperlipidemia; and MI (myocardial infarction) ([place]).  PAST SURGICAL HISTORY: He  has past surgical history that includes Coronary artery bypass graft (2008).  No Known Allergies  No current facility-administered medications on file prior to encounter.   Current Outpatient Prescriptions on File Prior to Encounter  Medication Sig  . aspirin EC 81 MG tablet Take by mouth.  Marland Kitchen atorvastatin (LIPITOR) 40 MG tablet Take 1 tablet (40 mg total) by mouth daily.  Marland Kitchen HYDROcodone-homatropine (HYCODAN) 5-1.5 MG/5ML syrup Take 5 mLs by mouth every 6 (six) hours as needed for cough.  Marland Kitchen lisinopril (PRINIVIL,ZESTRIL) 20 MG tablet Take 1 tablet (20 mg total) by mouth daily.  . metoprolol succinate (TOPROL-XL) 25 MG 24 hr tablet Take by mouth.    FAMILY HISTORY:  His has no family status information on file.   SOCIAL HISTORY: He  reports that he has quit smoking. His smoking use included Pipe. He started smoking about 30 years ago. He does not have any smokeless tobacco history on file.  REVIEW OF SYSTEMS:   NA  SUBJECTIVE:    VITAL SIGNS: BP 116/75 mmHg  Pulse 94  Temp(Src) 96.3 F (35.7 C)  Resp 20  Ht 5\' 9"  (1.753 m)  SpO2 99%  HEMODYNAMICS:    VENTILATOR SETTINGS: Vent Mode:  [-] PRVC FiO2 (%):  [60 %-100 %] 60 % Set Rate:  [18 bmp] 18 bmp Vt Set:  [550 mL] 550 mL PEEP:  [5 cmH20] 5 cmH20 Plateau Pressure:  [21 cmH20] 21 cmH20  INTAKE / OUTPUT:    PHYSICAL EXAMINATION: General:  WNWDWM on vent Neuro:  Sedated on vent HEENT:PERL Cardiovascular:  HSR RRR Lungs: CTA Abdomen:  Soft + bs Musculoskeletal: intact Skin: cool  LABS:  BMET  Recent Labs Lab 09/27/2015 1522  NA 140  K 4.3  CL 105  BUN 25*  CREATININE 1.00  GLUCOSE 171*    Electrolytes No results for input(s): CALCIUM, MG, PHOS in the last 168 hours.  CBC  Recent Labs Lab 09/23/2015 1520 09/16/2015 1522  WBC 21.3*  --   HGB 13.0 14.6  HCT 40.6 43.0  PLT 411*  --     Coag's  Recent Labs Lab 09/26/2015 1520  INR 1.38    Sepsis Markers  Recent Labs Lab 09/25/2015 1523  LATICACIDVEN 7.63*    ABG  Recent Labs Lab 09/26/2015 1545  PHART 7.235*  PCO2ART 44.9  PO2ART 192.0*    Liver Enzymes No results for input(s): AST, ALT, ALKPHOS, BILITOT, ALBUMIN in the last 168 hours.  Cardiac Enzymes No results for input(s): TROPONINI, PROBNP in the last 168 hours.  Glucose No results for input(s): GLUCAP in the last 168 hours.  Imaging Dg Chest Port 1 View  09/07/2015  CLINICAL DATA:  Patient with history of cardiac arrest. Status post intubation. EXAM: PORTABLE CHEST 1 VIEW COMPARISON:  Chest radiograph 09/29/2009 FINDINGS: ET tube terminates in the mid trachea. Enteric tube  courses inferior to the diaphragm. Pacer pad overlies the chest. Stable enlarged cardiac and mediastinal contours status post median sternotomy and CABG procedure. Diffuse bilateral predominately perihilar interstitial pulmonary opacities. No pleural effusion or pneumothorax. Regional skeleton is unremarkable. IMPRESSION: ET tube terminates in the mid trachea. Perihilar interstitial opacities most compatible with edema. Cardiomegaly. Electronically Signed   By: Lovey Newcomer M.D.   On: 09/22/2015 15:55   Dg Abd Portable 1v  09/11/2015  CLINICAL DATA:  Cardiac arrest, orogastric tube placement EXAM: PORTABLE ABDOMEN - 1 VIEW COMPARISON:  None. FINDINGS: Orogastric tube projects over the stomach with tip in the region of  the antrum. IMPRESSION: OG tube as described Electronically Signed   By: Skipper Cliche M.D.   On: 09/20/2015 15:54     STUDIES:  CT head>> CC 1/7>>  CULTURES: none  ANTIBIOTICS: none  SIGNIFICANT EVENTS: 1/7cardiac arrest  LINES/TUBES: 1/7 et>>  DISCUSSION: 70  yo with cad post arrest  ASSESSMENT / PLAN:  PULMONARY A: VDRF post arrest Suspected aspiration  P:   Vent bundle  CARDIOVASCULAR A:  Post cardiac arrest while shoveling snow. CPR mxm10 min epi x 1 CC post ct head  P:  Per Cadrs  RENAL A:   No acute issue P:     GASTROINTESTINAL A:   GI protection P:   PPI  HEMATOLOGIC A:   Anticoagulation per cardiology  P:  PTT  INFECTIOUS A:   No apparent infectious process but possible aspiration P:   NO abx at this time  ENDOCRINE A:   No acute issues P:     NEUROLOGIC A:   Currently sedated post intubation following cardiac arrest while soveling snow. P:   RASS goal: 0 CT prior to cath lab   FAMILY  - Updates: no family at bedside  - Inter-disciplinary family meet or Palliative Care meeting due by:  day [place] Pager [phone] till 3 pm If no answer page [phone] 09/03/2015, 4:30 PM

## 2015-09-04 NOTE — ED Provider Notes (Signed)
CSN: [id number]     Arrival date & time 09/22/2015  1515 History   First MD Initiated Contact with Patient 09/01/2015 1516     Chief Complaint  Patient presents with  . Cardiac Arrest     (Consider location/radiation/quality/duration/timing/severity/associated sxs/prior Treatment) HPI Comments: Level 5 exception of care due to acuity of condition  Patient is a 70 y.o. male presenting with general illness.  Illness Location:  Home Quality:  Cardiac arrest Severity:  Severe Onset quality:  Sudden Timing:  Constant Progression:  Unchanged Chronicity:  New Context:  Pt shoveling snow and had V-Fib arrest Relieved by:  Defibrillation x 2 and Epinephrine x 1   Past Medical History  Diagnosis Date  . Congestive cardiac failure ([place])   . Arthritis   . Hypertension   . Hyperlipidemia   . MI (myocardial infarction) [facility])    Past Surgical History  Procedure Laterality Date  . Coronary artery bypass graft  2008   No family history on file. Social History  Substance Use Topics  . Smoking status: Former Smoker    Types: Pipe    Start date: 08/28/1985  . Smokeless tobacco: None  . Alcohol Use: None    Review of Systems  Unable to perform ROS: Intubated      Allergies  Review of patient's allergies indicates no known allergies.  Home Medications   Prior to Admission medications   Medication Sig Start Date End Date Taking? Authorizing Provider  aspirin EC 81 MG tablet Take by mouth. 04/15/15 04/14/16  Historical Provider, MD  atorvastatin (LIPITOR) 40 MG tablet Take 1 tablet (40 mg total) by mouth daily. 03/10/15   Carmon Ginsberg, PA  HYDROcodone-homatropine [facility]) 5-1.5 MG/5ML syrup Take 5 mLs by mouth every 6 (six) hours as needed for cough. 05/26/15   Carmon Ginsberg, PA  lisinopril (PRINIVIL,ZESTRIL) 20 MG tablet Take 1 tablet (20 mg total) by mouth daily. 03/10/15   Carmon Ginsberg, PA  metoprolol succinate (TOPROL-XL) 25 MG 24 hr tablet Take by mouth. 04/15/15 04/14/16   Historical Provider, MD   BP 160/97 mmHg  Pulse 117  Temp(Src) 95.7 F (35.4 C)  Resp 20  Ht 5\' 9"  (1.753 m)  Wt 79.379 kg  BMI 25.83 kg/m2  SpO2 97% Physical Exam  Constitutional: He appears well-developed and well-nourished. He has a sickly appearance. He appears ill. He is intubated.  HENT:  Head: Head is without abrasion and without contusion.  Mouth/Throat:    Eyes: Conjunctivae are normal. Pupils are equal, round, and reactive to light.  Neck: Normal range of motion.  Cardiovascular: Regular rhythm.  Tachycardia present.  Exam reveals no decreased pulses.   No murmur heard. Pulmonary/Chest: Breath sounds normal. He is intubated.  Abdominal: Normal appearance. He exhibits no distension and no mass.  Neurological: He is unresponsive. GCS eye subscore is 1. GCS verbal subscore is 1. GCS motor subscore is 1.  Skin: No rash noted.    ED Course  .Intubation Date/Time: 09/18/2015 4:06 PM Performed by: Esaw Grandchild Authorized by: Esaw Grandchild Consent: The procedure was performed in an emergent situation. Patient identity confirmed: arm band Time out: Immediately prior to procedure a "time out" was called to verify the correct patient, procedure, equipment, support staff and site/side marked as required. Indications: airway protection and  respiratory distress Intubation method: video-assisted Patient status: paralyzed (RSI) Preoxygenation: BVM Sedatives: etomidate Paralytic: rocuronium Laryngoscope size: Mac 3 Tube size: 7.5 mm Tube type: cuffed Number of attempts: 1 Cords visualized: yes Post-procedure assessment: chest  rise and CO2 detector Breath sounds: equal Cuff inflated: yes ETT to lip: 23 cm Tube secured with: ETT holder Chest x-ray findings: endotracheal tube in appropriate position Patient tolerance: Patient tolerated the procedure well with no immediate complications  .Critical Care Performed by: Esaw Grandchild Authorized by: Esaw Grandchild Total critical care time: 40 minutes Critical care was necessary to treat or prevent imminent or life-threatening deterioration of the following conditions: cardiac failure and respiratory failure. Critical care was time spent personally by me on the following activities: blood draw for specimens, development of treatment plan with patient or surrogate, discussions with consultants, evaluation of patient's response to treatment, examination of patient, obtaining history from patient or surrogate, ordering and performing treatments and interventions, ordering and review of laboratory studies, ordering and review of radiographic studies, pulse oximetry, re-evaluation of patient's condition, review of old charts and ventilator management.   (including critical care time) Labs Review Labs Reviewed  COMPREHENSIVE METABOLIC PANEL - Abnormal; Notable for the following:    CO2 18 (*)    Glucose, Bld 172 (*)    Calcium 8.7 (*)    Total Protein 6.3 (*)    Albumin 3.2 (*)    AST 145 (*)    ALT 137 (*)    Anion gap 16 (*)    All other components within normal limits  CBC WITH DIFFERENTIAL/PLATELET - Abnormal; Notable for the following:    WBC 21.3 (*)    MCH 25.9 (*)    Platelets 411 (*)    Neutro Abs 17.9 (*)    All other components within normal limits  PROTIME-INR - Abnormal; Notable for the following:    Prothrombin Time 17.1 (*)    All other components within normal limits  BRAIN NATRIURETIC PEPTIDE - Abnormal; Notable for the following:    B Natriuretic Peptide 235.5 (*)    All other components within normal limits  I-STAT CG4 LACTIC ACID, ED - Abnormal; Notable for the following:    Lactic Acid, Venous 7.63 (*)    All other components within normal limits  I-STAT CHEM 8, ED - Abnormal; Notable for the following:    BUN 25 (*)    Glucose, Bld 171 (*)    Calcium, Ion 1.12 (*)    All other components within normal limits  I-STAT TROPOININ, ED - Abnormal; Notable for the  following:    Troponin i, poc 0.14 (*)    All other components within normal limits  I-STAT ARTERIAL BLOOD GAS, ED - Abnormal; Notable for the following:    pH, Arterial 7.235 (*)    pO2, Arterial 192.0 (*)    Bicarbonate 19.0 (*)    Acid-base deficit 8.0 (*)    All other components within normal limits  CULTURE, BLOOD (ROUTINE X 2)  CULTURE, BLOOD (ROUTINE X 2)  URINE CULTURE  CULTURE, RESPIRATORY (NON-EXPECTORATED)  COMPREHENSIVE METABOLIC PANEL  MAGNESIUM  PHOSPHORUS  LACTIC ACID, PLASMA  PROCALCITONIN  CORTISOL  CBC  PROTIME-INR  APTT  STREP PNEUMONIAE URINARY ANTIGEN  BLOOD GAS, ARTERIAL  CBC  BASIC METABOLIC PANEL  BLOOD GAS, ARTERIAL  TROPONIN I  TROPONIN I  TROPONIN I  BLOOD GAS, ARTERIAL  APTT  PROTIME-INR  MAGNESIUM  PROTIME-INR  APTT  BLOOD GAS, ARTERIAL  BLOOD GAS, ARTERIAL    Imaging Review Ct Head Wo Contrast  09/06/2015  CLINICAL DATA:  Cardiac arrest today. Intubated and going to cath lab. EXAM: CT HEAD WITHOUT CONTRAST TECHNIQUE: Contiguous axial images were obtained from the base  of the skull through the vertex without intravenous contrast. COMPARISON:  None. FINDINGS: Patient is status post a previous left frontoparietal craniectomy. There is mild generalized brain atrophy with commensurate dilatation of the ventricles and sulci. Mild chronic small vessel ischemic change noted within the bilateral periventricular white matter regions. There is no mass, hemorrhage, edema or other evidence of acute parenchymal abnormality. No extra-axial hemorrhage. There are chronic calcified atherosclerotic changes of the large vessels at the skull base. No acute osseous abnormality. Mild mucosal thickening noted within the ethmoid air cells. Small fluid levels within the sphenoid sinuses. Mastoid air cells are clear. Superficial soft tissues are unremarkable. IMPRESSION: 1. Mild atrophy and chronic ischemic changes in the deep periventricular white matter. 2. No evidence  of acute intracranial abnormality. No intracranial mass, hemorrhage or edema. 3. Surgical changes of a previous left frontoparietal craniectomy. 4. Mild paranasal sinus disease. Electronically Signed   By: Franki Cabot M.D.   On: 09/19/2015 16:56   Dg Chest Port 1 View  09/17/2015  CLINICAL DATA:  Patient with history of cardiac arrest. Status post intubation. EXAM: PORTABLE CHEST 1 VIEW COMPARISON:  Chest radiograph 09/29/2009 FINDINGS: ET tube terminates in the mid trachea. Enteric tube courses inferior to the diaphragm. Pacer pad overlies the chest. Stable enlarged cardiac and mediastinal contours status post median sternotomy and CABG procedure. Diffuse bilateral predominately perihilar interstitial pulmonary opacities. No pleural effusion or pneumothorax. Regional skeleton is unremarkable. IMPRESSION: ET tube terminates in the mid trachea. Perihilar interstitial opacities most compatible with edema. Cardiomegaly. Electronically Signed   By: Lovey Newcomer M.D.   On: 09/01/2015 15:55   Dg Abd Portable 1v  09/24/2015  CLINICAL DATA:  Cardiac arrest, orogastric tube placement EXAM: PORTABLE ABDOMEN - 1 VIEW COMPARISON:  None. FINDINGS: Orogastric tube projects over the stomach with tip in the region of the antrum. IMPRESSION: OG tube as described Electronically Signed   By: Skipper Cliche M.D.   On: 09/10/2015 15:54   I have personally reviewed and evaluated these images and lab results as part of my medical decision-making.   EKG Interpretation   Date/Time:  Saturday September 04 2015 15:26:09 EST Ventricular Rate:  117 PR Interval:    QRS Duration: 154 QT Interval:  371 QTC Calculation: 518 R Axis:   13 Text Interpretation:  Atrial fibrillation LVH with secondary  repolarization abnormality Anterior infarct, old ST depression, consider  ischemia, diffuse lds Prolonged QT interval Confirmed by Hazle Coca [phone])  on 09/06/2015 3:43:00 PM      MDM   Final diagnoses:  Cardiac arrest ([place])   Elevated troponin  Lactic acid acidosis    70 year old Caucasian male with unknown past medical history presents in setting of cardiac arrest. Per EMS report patient was shoveling snow when he had a witnessed cardiac arrest. Medical team arrived quickly and patient started on CPR and noted to be in ventricular fibrillation. Patient had defibrillations 2 and one dose of epinephrine with return of spontaneous circulation. Intubation attempted in field and was unsuccessful. Patient had nasal airway obtained and was having spontaneous respirations and field. In route patient began to have multiple bouts of nonbloody nonbilious emesis..  On arrival patient had pulses intact and large amount of emesis around the mouth and an oral cavity. Patient had minimal pupil response and minimal gag. Patient breathing on his own but it was determined at that time he would need intubation for airway protection and cardiac failure. Patient intubated without complications as noted above. Patient cooled  with ice at bedside. EKG revealed left bundle branch block and patient had previous EKG which demonstrated left bundle branch block. However morphology different at this time. No obvious STEMI at this time however cardiology was consulted. Troponin and lactic acid elevated.  ABG demonstrated metabolic acidosis and vent settings adjusted accordingly.  Cardiology has seen and evaluated patient and I discussed case with Dr. Radford Pax. Due to patient's history patient will be taken to the cath lab for further evaluation. I discussed case with Dr. Curt Jews of critical care. Patient had CT head obtained prior to transfer to cath lab which did not reveal acute intracranial abnormality. Patient hemodynamically stable at time of transfer. Family was updated on plan.  Attending has seen and evaluated patient and Dr. Ralene Bathe is in agreement with plan.    Esaw Grandchild, MD 09/14/2015 1731  Quintella Reichert, MD 09/05/15 [phone]

## 2015-09-04 NOTE — Progress Notes (Signed)
   09/12/2015 1726  Clinical Encounter Type  Visited With Family;Health care provider  Visit Type Initial;Critical Care  Referral From Nurse  Spiritual Encounters  Spiritual Needs Emotional  Stress Factors  Family Stress Factors Health changes   Chaplain responded to an urgent need for a catheter and assisted the patient's wife. Chaplain helped her receive an update from the doctor and then helped her transition to [facility] waiting area. Chaplain offered support, and our support is available as needed.   Jeri Lager, Chaplain 09/25/2015 5:53 PM

## 2015-09-04 NOTE — Progress Notes (Signed)
Nimbex, fentanyl and Versed along with cold saline and Levophed hand delivered to [facility] in cath lab #5.   Madysen Faircloth D. Seeley Hissong, PharmD, BCPS Clinical Pharmacist 09/03/2015 5:26 PM

## 2015-09-04 NOTE — Progress Notes (Signed)
[name] Progress Note Patient Name: Patrick Sexton [facility] DOB: 12-Aug-1946 MRN: [id number]   Date of Service  09/09/2015  HPI/Events of Note  Pt with resp acidosis.  Will need vent adjustment  eICU Interventions  Increase rate to 28     Intervention Category Major Interventions: Respiratory failure - evaluation and management  Asencion Noble 09/25/2015, 7:23 PM

## 2015-09-04 NOTE — ED Notes (Signed)
Received pt via EMS with c/o cardiac arrest while shoveling snow. 10-15 mins of CPR perfromed by EMS with 1 epi. Pt shocked x 2 by fire for rhythm of vfib. Pt was intubated with king airway by EMS and given 2.5mg  of versed. EMS attempted to change out the king airway but was unable to get an ETT tube placed, pt BVM upon arrival to ED.

## 2015-09-04 NOTE — Progress Notes (Signed)
Per CCM fellow, Central line okay to use. RN will continue to monitor.

## 2015-09-05 ENCOUNTER — Inpatient Hospital Stay (HOSPITAL_COMMUNITY): Payer: Medicare Other

## 2015-09-05 DIAGNOSIS — N179 Acute kidney failure, unspecified: Secondary | ICD-10-CM

## 2015-09-05 DIAGNOSIS — J9601 Acute respiratory failure with hypoxia: Secondary | ICD-10-CM

## 2015-09-05 LAB — GLUCOSE, CAPILLARY
GLUCOSE-CAPILLARY: 124 mg/dL — AB (ref 65–99)
GLUCOSE-CAPILLARY: 189 mg/dL — AB (ref 65–99)
GLUCOSE-CAPILLARY: 233 mg/dL — AB (ref 65–99)
GLUCOSE-CAPILLARY: 234 mg/dL — AB (ref 65–99)
Glucose-Capillary: 147 mg/dL — ABNORMAL HIGH (ref 65–99)
Glucose-Capillary: 140 mg/dL — ABNORMAL HIGH (ref 65–99)
Glucose-Capillary: 169 mg/dL — ABNORMAL HIGH (ref 65–99)
Glucose-Capillary: 219 mg/dL — ABNORMAL HIGH (ref 65–99)
Glucose-Capillary: 194 mg/dL — ABNORMAL HIGH (ref 65–99)
Glucose-Capillary: 181 mg/dL — ABNORMAL HIGH (ref 65–99)
Glucose-Capillary: 174 mg/dL — ABNORMAL HIGH (ref 65–99)
Glucose-Capillary: 173 mg/dL — ABNORMAL HIGH (ref 65–99)
Glucose-Capillary: 141 mg/dL — ABNORMAL HIGH (ref 65–99)
Glucose-Capillary: 119 mg/dL — ABNORMAL HIGH (ref 65–99)
Glucose-Capillary: 129 mg/dL — ABNORMAL HIGH (ref 65–99)
Glucose-Capillary: 160 mg/dL — ABNORMAL HIGH (ref 65–99)

## 2015-09-05 LAB — POCT I-STAT, CHEM 8
BUN: 23 mg/dL — ABNORMAL HIGH (ref 6–20)
BUN: 27 mg/dL — ABNORMAL HIGH (ref 6–20)
BUN: 28 mg/dL — ABNORMAL HIGH (ref 6–20)
CALCIUM ION: 1.03 mmol/L — AB (ref 1.13–1.30)
CALCIUM ION: 1.11 mmol/L — AB (ref 1.13–1.30)
CALCIUM ION: 1.02 mmol/L — AB (ref 1.13–1.30)
CREATININE: 1 mg/dL (ref 0.61–1.24)
Chloride: 111 mmol/L (ref 101–111)
Chloride: 103 mmol/L (ref 101–111)
Chloride: 102 mmol/L (ref 101–111)
Creatinine, Ser: 1.2 mg/dL (ref 0.61–1.24)
Creatinine, Ser: 1.2 mg/dL (ref 0.61–1.24)
Glucose, Bld: 149 mg/dL — ABNORMAL HIGH (ref 65–99)
Glucose, Bld: 144 mg/dL — ABNORMAL HIGH (ref 65–99)
Glucose, Bld: 169 mg/dL — ABNORMAL HIGH (ref 65–99)
HCT: 42 % (ref 39.0–52.0)
HEMATOCRIT: 42 % (ref 39.0–52.0)
HEMATOCRIT: 48 % (ref 39.0–52.0)
HEMOGLOBIN: 14.3 g/dL (ref 13.0–17.0)
HEMOGLOBIN: 14.3 g/dL (ref 13.0–17.0)
HEMOGLOBIN: 16.3 g/dL (ref 13.0–17.0)
Potassium: 3.3 mmol/L — ABNORMAL LOW (ref 3.5–5.1)
Potassium: 3.1 mmol/L — ABNORMAL LOW (ref 3.5–5.1)
Potassium: 4 mmol/L (ref 3.5–5.1)
SODIUM: 135 mmol/L (ref 135–145)
SODIUM: 139 mmol/L (ref 135–145)
SODIUM: 137 mmol/L (ref 135–145)
TCO2: 18 mmol/L (ref 0–100)
TCO2: 19 mmol/L (ref 0–100)
TCO2: 19 mmol/L (ref 0–100)

## 2015-09-05 LAB — BASIC METABOLIC PANEL
ANION GAP: 12 (ref 5–15)
ANION GAP: 14 (ref 5–15)
ANION GAP: 14 (ref 5–15)
Anion gap: 13 (ref 5–15)
BUN: 22 mg/dL — ABNORMAL HIGH (ref 6–20)
BUN: 23 mg/dL — ABNORMAL HIGH (ref 6–20)
BUN: 23 mg/dL — ABNORMAL HIGH (ref 6–20)
BUN: 27 mg/dL — AB (ref 6–20)
CALCIUM: 7.7 mg/dL — AB (ref 8.9–10.3)
CALCIUM: 7.7 mg/dL — AB (ref 8.9–10.3)
CALCIUM: 7.3 mg/dL — AB (ref 8.9–10.3)
CO2: 16 mmol/L — ABNORMAL LOW (ref 22–32)
CO2: 16 mmol/L — ABNORMAL LOW (ref 22–32)
CO2: 18 mmol/L — ABNORMAL LOW (ref 22–32)
CO2: 21 mmol/L — ABNORMAL LOW (ref 22–32)
CREATININE: 1.2 mg/dL (ref 0.61–1.24)
Calcium: 7.7 mg/dL — ABNORMAL LOW (ref 8.9–10.3)
Chloride: 102 mmol/L (ref 101–111)
Chloride: 105 mmol/L (ref 101–111)
Chloride: 106 mmol/L (ref 101–111)
Chloride: 107 mmol/L (ref 101–111)
Creatinine, Ser: 1.09 mg/dL (ref 0.61–1.24)
Creatinine, Ser: 1.17 mg/dL (ref 0.61–1.24)
Creatinine, Ser: 1.37 mg/dL — ABNORMAL HIGH (ref 0.61–1.24)
GFR calc Af Amer: >60 mL/min (ref >60)
GFR, EST AFRICAN AMERICAN: 59 mL/min — AB (ref >60)
GFR, EST NON AFRICAN AMERICAN: 51 mL/min — AB (ref >60)
GFR, EST NON AFRICAN AMERICAN: 60 mL/min — AB (ref >60)
GLUCOSE: 211 mg/dL — AB (ref 65–99)
GLUCOSE: 270 mg/dL — AB (ref 65–99)
GLUCOSE: 160 mg/dL — AB (ref 65–99)
Glucose, Bld: 238 mg/dL — ABNORMAL HIGH (ref 65–99)
POTASSIUM: 2.8 mmol/L — AB (ref 3.5–5.1)
POTASSIUM: 3.5 mmol/L (ref 3.5–5.1)
POTASSIUM: 4.3 mmol/L (ref 3.5–5.1)
POTASSIUM: 3.5 mmol/L (ref 3.5–5.1)
SODIUM: 137 mmol/L (ref 135–145)
SODIUM: 136 mmol/L (ref 135–145)
Sodium: 135 mmol/L (ref 135–145)
Sodium: 136 mmol/L (ref 135–145)

## 2015-09-05 LAB — POCT I-STAT 3, ART BLOOD GAS (G3+)
ACID-BASE DEFICIT: 10 mmol/L — AB (ref 0.0–2.0)
BICARBONATE: 16.1 meq/L — AB (ref 20.0–24.0)
O2 Saturation: 97 %
TCO2: 17 mmol/L (ref 0–100)
pCO2 arterial: 27.8 mmHg — ABNORMAL LOW (ref 35.0–45.0)
pH, Arterial: 7.341 — ABNORMAL LOW (ref 7.350–7.450)
pO2, Arterial: 73 mmHg — ABNORMAL LOW (ref 80.0–100.0)

## 2015-09-05 LAB — TROPONIN I
TROPONIN I: 60.18 ng/mL — AB (ref <0.031)
TROPONIN I: 64.9 ng/mL — AB (ref <0.031)
Troponin I: 24.24 ng/mL (ref <0.031)
Troponin I: >65 ng/mL (ref <0.031)

## 2015-09-05 LAB — APTT
APTT: 34 s (ref 24–37)
APTT: 44 s — AB (ref 24–37)

## 2015-09-05 LAB — DIFFERENTIAL
BASOS PCT: 0 %
Basophils Absolute: 0 10*3/uL (ref 0.0–0.1)
EOS ABS: 0 10*3/uL (ref 0.0–0.7)
Eosinophils Relative: 0 %
Lymphocytes Relative: 9 %
Lymphs Abs: 1 10*3/uL (ref 0.7–4.0)
MONO ABS: 0.5 10*3/uL (ref 0.1–1.0)
MONOS PCT: 4 %
Neutro Abs: 9 10*3/uL — ABNORMAL HIGH (ref 1.7–7.7)
Neutrophils Relative %: 87 %

## 2015-09-05 LAB — HEPARIN LEVEL (UNFRACTIONATED)
HEPARIN UNFRACTIONATED: 0.24 [IU]/mL — AB (ref 0.30–0.70)
Heparin Unfractionated: 0.28 IU/mL — ABNORMAL LOW (ref 0.30–0.70)

## 2015-09-05 LAB — PROTIME-INR
INR: 1.5 — ABNORMAL HIGH (ref 0.00–1.49)
INR: 1.49 (ref 0.00–1.49)
PROTHROMBIN TIME: 18.1 s — AB (ref 11.6–15.2)
PROTHROMBIN TIME: 18.2 s — AB (ref 11.6–15.2)

## 2015-09-05 LAB — CBC
HCT: 42.7 % (ref 39.0–52.0)
Hemoglobin: 13.5 g/dL (ref 13.0–17.0)
MCH: 25 pg — ABNORMAL LOW (ref 26.0–34.0)
MCHC: 31.6 g/dL (ref 30.0–36.0)
MCV: 79.1 fL (ref 78.0–100.0)
PLATELETS: 372 10*3/uL (ref 150–400)
RBC: 5.4 MIL/uL (ref 4.22–5.81)
RDW: 14.8 % (ref 11.5–15.5)
WBC: 10.5 10*3/uL (ref 4.0–10.5)

## 2015-09-05 LAB — MRSA PCR SCREENING: MRSA by PCR: NEGATIVE

## 2015-09-05 LAB — LACTIC ACID, PLASMA: Lactic Acid, Venous: 4.8 mmol/L (ref 0.5–2.0)

## 2015-09-05 LAB — MAGNESIUM: Magnesium: 2.5 mg/dL — ABNORMAL HIGH (ref 1.7–2.4)

## 2015-09-05 MED ORDER — ATROPINE SULFATE 0.1 MG/ML IJ SOLN
INTRAMUSCULAR | Status: AC
  Filled 2015-09-05: qty 10

## 2015-09-05 MED ORDER — INSULIN ASPART 100 UNIT/ML ~~LOC~~ SOLN
2.0000 [IU] | SUBCUTANEOUS | Status: DC
Start: 2015-09-05 — End: 2015-09-22
  Administered 2015-09-05 – 2015-09-10 (×8): 2 [IU] via SUBCUTANEOUS
  Administered 2015-09-10: 4 [IU] via SUBCUTANEOUS
  Administered 2015-09-10 – 2015-09-11 (×3): 2 [IU] via SUBCUTANEOUS
  Administered 2015-09-11: 4 [IU] via SUBCUTANEOUS
  Administered 2015-09-11 – 2015-09-13 (×6): 2 [IU] via SUBCUTANEOUS
  Administered 2015-09-13: 4 [IU] via SUBCUTANEOUS
  Administered 2015-09-13: 2 [IU] via SUBCUTANEOUS
  Administered 2015-09-14 (×5): 4 [IU] via SUBCUTANEOUS
  Administered 2015-09-14: 2 [IU] via SUBCUTANEOUS
  Administered 2015-09-15: 4 [IU] via SUBCUTANEOUS
  Administered 2015-09-15 (×2): 2 [IU] via SUBCUTANEOUS
  Administered 2015-09-15 (×2): 4 [IU] via SUBCUTANEOUS
  Administered 2015-09-16: 2 [IU] via SUBCUTANEOUS
  Administered 2015-09-16: 6 [IU] via SUBCUTANEOUS
  Administered 2015-09-16 (×3): 4 [IU] via SUBCUTANEOUS
  Administered 2015-09-16 – 2015-09-17 (×3): 2 [IU] via SUBCUTANEOUS
  Administered 2015-09-17 (×2): 4 [IU] via SUBCUTANEOUS
  Administered 2015-09-17: 2 [IU] via SUBCUTANEOUS
  Administered 2015-09-17: 4 [IU] via SUBCUTANEOUS
  Administered 2015-09-17 – 2015-09-18 (×3): 2 [IU] via SUBCUTANEOUS
  Administered 2015-09-18: 4 [IU] via SUBCUTANEOUS
  Administered 2015-09-18: 2 [IU] via SUBCUTANEOUS
  Administered 2015-09-19: 6 [IU] via SUBCUTANEOUS
  Administered 2015-09-19 (×2): 4 [IU] via SUBCUTANEOUS
  Administered 2015-09-19 (×2): 6 [IU] via SUBCUTANEOUS
  Administered 2015-09-19 – 2015-09-20 (×3): 4 [IU] via SUBCUTANEOUS
  Administered 2015-09-20: 2 [IU] via SUBCUTANEOUS
  Administered 2015-09-20 (×4): 4 [IU] via SUBCUTANEOUS
  Administered 2015-09-21: 2 [IU] via SUBCUTANEOUS

## 2015-09-05 MED ORDER — POTASSIUM CHLORIDE 10 MEQ/50ML IV SOLN
10.0000 meq | INTRAVENOUS | Status: AC
  Administered 2015-09-05 (×3): 10 meq via INTRAVENOUS
  Filled 2015-09-05: qty 50

## 2015-09-05 MED ORDER — PIPERACILLIN-TAZOBACTAM 3.375 G IVPB
3.3750 g | Freq: Three times a day (TID) | INTRAVENOUS | Status: DC
Start: 2015-09-05 — End: 2015-09-05
  Filled 2015-09-05 (×3): qty 50

## 2015-09-05 MED ORDER — HEPARIN (PORCINE) IN NACL 100-0.45 UNIT/ML-% IJ SOLN
1100.0000 [IU]/h | INTRAMUSCULAR | Status: DC
  Administered 2015-09-05 – 2015-09-06 (×2): 800 [IU]/h via INTRAVENOUS
  Filled 2015-09-05 (×2): qty 250

## 2015-09-05 MED ORDER — TICAGRELOR 90 MG PO TABS
180.0000 mg | ORAL_TABLET | Freq: Once | ORAL | Status: AC
  Administered 2015-09-05: 180 mg via ORAL
  Filled 2015-09-05: qty 2

## 2015-09-05 MED ORDER — POTASSIUM CHLORIDE 10 MEQ/50ML IV SOLN
10.0000 meq | INTRAVENOUS | Status: AC
  Administered 2015-09-05 (×4): 10 meq via INTRAVENOUS
  Filled 2015-09-05 (×3): qty 50

## 2015-09-05 MED ORDER — SODIUM CHLORIDE 0.9 % IV SOLN
INTRAVENOUS | Status: DC
  Administered 2015-09-05: 1.3 [IU]/h via INTRAVENOUS
  Filled 2015-09-05: qty 2.5

## 2015-09-05 MED ORDER — ATORVASTATIN CALCIUM 80 MG PO TABS
80.0000 mg | ORAL_TABLET | Freq: Every day | ORAL | Status: DC
  Administered 2015-09-05 – 2015-09-20 (×16): 80 mg via ORAL
  Filled 2015-09-05 (×16): qty 1

## 2015-09-05 MED ORDER — INSULIN GLARGINE 100 UNIT/ML ~~LOC~~ SOLN
10.0000 [IU] | SUBCUTANEOUS | Status: DC
  Administered 2015-09-05 – 2015-09-18 (×14): 10 [IU] via SUBCUTANEOUS
  Filled 2015-09-05 (×18): qty 0.1

## 2015-09-05 MED ORDER — SODIUM CHLORIDE 0.9 % IV SOLN
3.0000 g | Freq: Three times a day (TID) | INTRAVENOUS | Status: DC
  Administered 2015-09-05 – 2015-09-06 (×3): 3 g via INTRAVENOUS
  Filled 2015-09-05 (×6): qty 3

## 2015-09-05 NOTE — Progress Notes (Signed)
CRITICAL VALUE ALERT  Critical value received:  Troponin 5.96  Date of notification:  09/07/2015  Time of notification:  2015  Critical value read back:yes  Nurse who received alert: Katrine Coho  MD notified (1st page):  Dr. Susy Manor  Time of first page: 2140  Responding MD:  Dr. Susy Manor  Time MD responded:  2155

## 2015-09-05 NOTE — Progress Notes (Signed)
Continuing to titrate up on levophed. BP consistently low. Dr. Joya Gaskins notified and orders placed for sodium bicarb.

## 2015-09-05 NOTE — Progress Notes (Signed)
Called E-link MD regarding potassium of 2.8. Replacement to be ordered. Will continue to monitor.

## 2015-09-05 NOTE — Progress Notes (Signed)
CRITICAL VALUE ALERT  Critical value received:  Lactic Acid 3.5   Date of notification:  09/03/2015  Time of notification:  1954  Critical value read back:yes  Nurse who received alert:  Katrine Coho  MD notified (1st page): E-link MD  Time of first page: 1958  Responding MD:  E-link MD  Time MD [id number]  MD also made aware of K 3.4-orders placed to replace

## 2015-09-05 NOTE — Progress Notes (Signed)
Right femoral sheath removed at 0042. Pulses dopplerable prior to sheath removal. Pressure applied for 24 minutes. Pt. Tolerated procedure well. Pulse status post removal remains dopplerable. Pt. Unable to receive post procedure instructions. Pressure dressing applied. Will continue to monitor.

## 2015-09-05 NOTE — Progress Notes (Signed)
eLink Physician-Brief Progress Note Patient Name: Patrick Sexton [facility] DOB: 09/23/1945 MRN: [id number]   Date of Service  09/05/2015  HPI/Events of Note  BMET    Component Value Date/Time   NA 137 09/05/2015 0424   K 2.8* 09/05/2015 0424   CL 107 09/05/2015 0424   CO2 18* 09/05/2015 0424   GLUCOSE 211* 09/05/2015 0424   BUN 23* 09/05/2015 0424   CREATININE 1.37* 09/05/2015 0424   CALCIUM 7.7* 09/05/2015 0424   GFRNONAA 51* 09/05/2015 0424   GFRAA 59* 09/05/2015 0424      eICU Interventions  Will give IV KCL.     Intervention Category Major Interventions: Other:  Achol Azpeitia 09/05/2015, 5:19 AM

## 2015-09-05 NOTE — Psychosocial Assessment (Signed)
Paged Dr. Susy Manor regarding increase in troponin to 60.18. Will continue to monitor.

## 2015-09-05 NOTE — Progress Notes (Signed)
Dr. Susy Manor paged re: repeat troponin of 16.91. Orders placed for heparin infusion and loading dose of birlinta. Will continue to monitor.

## 2015-09-05 NOTE — Progress Notes (Signed)
PULMONARY / CRITICAL CARE MEDICINE   Name: Patrick [facility]xton [facility] MRN: [id number] DOB: 1946-06-29    ADMISSION DATE:  09/23/2015   REFERRING MD:  EDP  CHIEF COMPLAINT:  Cardiac arrest   HISTORY OF PRESENT ILLNESS:   70 yo with hx CAD who was shoveling snow at went down. Fire quickly on scene with CPR and EMS gave epi x 1. He was HD stable and will be taken to CC after CT head completed. Brought to cath lab on vent, no intervention but severe LV dysfunction and severe native and graft disease  SUBJECTIVE:  Sedated, paralysed, at goal temp Poor UO  VITAL SIGNS: BP 90/66 mmHg  Pulse 44  Temp(Src) 88.3 F (31.3 C) (Core (Comment))  Resp 30  Ht 5\' 9"  (1.753 m)  Wt 81.41 kg (179 lb 7.6 oz)  BMI 26.49 kg/m2  SpO2 100%  HEMODYNAMICS: CVP:  [10 mmHg-12 mmHg] 10 mmHg  VENTILATOR SETTINGS: Vent Mode:  [-] PRVC FiO2 (%):  [60 %-100 %] 100 % Set Rate:  [18 bmp-30 bmp] 30 bmp Vt Set:  [550 mL] 550 mL PEEP:  [5 cmH20-10 cmH20] 10 cmH20 Plateau Pressure:  [21 cmH20-32 cmH20] 29 cmH20  INTAKE / OUTPUT: I/O last 3 completed shifts: In: 2502.5 [I.V.:2182.5; NG/GT:120; IV Piggyback:200] Out: 1040 [id number]  PHYSICAL EXAMINATION: General: acutely ill , sedated , paralysed on vent Neuro: paralysed, pupils 51mm RTL HEENT:PERL Cardiovascular:  HSR RRR Lungs: CTA Abdomen:  Soft + bs Musculoskeletal: intact Skin: cool  LABS:  BMET  Recent Labs Lab 08/31/2015 2344 09/05/15 0120 09/05/15 0424  NA 136 135 137  K 4.3 3.5 2.8*  CL 106 105 107  CO2 16* 16* 18*  BUN 22* 23* 23*  CREATININE 1.09 1.17 1.37*  GLUCOSE 270* 238* 211*    Electrolytes  Recent Labs Lab 09/07/2015 1845  09/24/2015 2344 09/05/15 0120 09/05/15 0424  CALCIUM  --   < > 7.7* 7.7* 7.7*  MG 1.8  --   --   --  2.5*  PHOS 5.9*  --   --   --   --   < > = values in this interval not displayed.  CBC  Recent Labs Lab 09/28/2015 1520 09/23/2015 1522 09/03/2015 1845 09/05/15 0424  WBC 21.3*  --  17.9* 10.5  HGB  13.0 14.6 14.6 13.5  HCT 40.6 43.0 46.1 42.7  PLT 411*  --  471* 372    Coag's  Recent Labs Lab 09/08/2015 1845 09/05/15 0020 09/05/15 0424  APTT 156* 34 44*  INR 1.65* 1.49 1.50*    Sepsis Markers  Recent Labs Lab 08/30/2015 1523 09/07/2015 1651 09/15/2015 1845 09/07/2015 2341  LATICACIDVEN 7.63* 3.5*  --  4.8*  PROCALCITON  --   --  0.29  --     ABG  Recent Labs Lab 09/12/2015 1948 09/03/2015 2205 09/05/15 0638  PHART 7.300* 7.361 7.341*  PCO2ART 31.7* 25.2* 27.8*  PO2ART 57.0* 76.0* 73.0*    Liver Enzymes  Recent Labs Lab 08/29/2015 1520  AST 145*  ALT 137*  ALKPHOS 87  BILITOT 0.8  ALBUMIN 3.2*    Cardiac Enzymes  Recent Labs Lab 09/07/2015 2344 09/05/15 0424 09/05/15 0620  TROPONINI 24.24* 60.18* 64.90*    Glucose  Recent Labs Lab 09/03/2015 2147 09/08/2015 2319 09/05/15 0017 09/05/15 0121 09/05/15 0226 09/05/15 0407  GLUCAP 169* 233* 234* 219* 194* 189*    Imaging Ct Head Wo Contrast  09/23/2015  CLINICAL DATA:  Cardiac arrest today. Intubated and going to cath lab.  EXAM: CT HEAD WITHOUT CONTRAST TECHNIQUE: Contiguous axial images were obtained from the base of the skull through the vertex without intravenous contrast. COMPARISON:  None. FINDINGS: Patient is status post a previous left frontoparietal craniectomy. There is mild generalized brain atrophy with commensurate dilatation of the ventricles and sulci. Mild chronic small vessel ischemic change noted within the bilateral periventricular white matter regions. There is no mass, hemorrhage, edema or other evidence of acute parenchymal abnormality. No extra-axial hemorrhage. There are chronic calcified atherosclerotic changes of the large vessels at the skull base. No acute osseous abnormality. Mild mucosal thickening noted within the ethmoid air cells. Small fluid levels within the sphenoid sinuses. Mastoid air cells are clear. Superficial soft tissues are unremarkable. IMPRESSION: 1. Mild atrophy and  chronic ischemic changes in the deep periventricular white matter. 2. No evidence of acute intracranial abnormality. No intracranial mass, hemorrhage or edema. 3. Surgical changes of a previous left frontoparietal craniectomy. 4. Mild paranasal sinus disease. Electronically Signed   By: Franki Cabot M.D.   On: 09/20/2015 16:56   Dg Chest Port 1 View  09/20/2015  CLINICAL DATA:  Evaluate nasogastric tube and central line placement. EXAM: PORTABLE CHEST 1 VIEW COMPARISON:  09/02/2015 at 1531 hours FINDINGS: New left internal jugular central venous line has been placed. Tip projects in the mid to lower superior vena cava. There is no pneumothorax. Nasogastric tube is incompletely imaged. It passes below the included field of view but is likely within the stomach. Endotracheal tube is well positioned, tip projecting 3.4 cm above the carina. Lungs show hazy bilateral airspace opacities, which are upper lobe and centrally predominant, likely pulmonary edema. IMPRESSION: 1. Left central venous line tip projects in the mid to lower superior vena cava. No pneumothorax. 2. Nasogastric tube not completely visualized. Likely extends into the stomach. 3. Hazy bilateral airspace lung opacities likely due to pulmonary edema. This has increased when compared to the earlier study. Electronically Signed   By: Lajean Manes M.D.   On: 09/03/2015 21:44   Dg Chest Port 1 View  09/08/2015  CLINICAL DATA:  Patient with history of cardiac arrest. Status post intubation. EXAM: PORTABLE CHEST 1 VIEW COMPARISON:  Chest radiograph 09/29/2009 FINDINGS: ET tube terminates in the mid trachea. Enteric tube courses inferior to the diaphragm. Pacer pad overlies the chest. Stable enlarged cardiac and mediastinal contours status post median sternotomy and CABG procedure. Diffuse bilateral predominately perihilar interstitial pulmonary opacities. No pleural effusion or pneumothorax. Regional skeleton is unremarkable. IMPRESSION: ET tube terminates  in the mid trachea. Perihilar interstitial opacities most compatible with edema. Cardiomegaly. Electronically Signed   By: Lovey Newcomer M.D.   On: 09/17/2015 15:55   Dg Abd Portable 1v  09/27/2015  CLINICAL DATA:  Orogastric tube placement.  Initial encounter. EXAM: PORTABLE ABDOMEN - 1 VIEW COMPARISON:  Abdominal radiograph performed earlier today at 3:34 p.m. FINDINGS: The patient's enteric tube is noted ending overlying the antrum of the stomach. The visualized bowel gas pattern is unremarkable. Scattered air and stool filled loops of colon are seen; no abnormal dilatation of small bowel loops is seen to suggest small bowel obstruction. No free intra-abdominal air is identified, though evaluation for free air is limited on a single supine view. Contrast is noted within the bladder, with a Foley catheter noted in place. The visualized osseous structures are within normal limits; the sacroiliac joints are unremarkable in appearance. The visualized lung bases are essentially clear. IMPRESSION: Enteric tube noted ending overlying the antrum of the  stomach. Electronically Signed   By: Garald Balding M.D.   On: 09/18/2015 21:44   Dg Abd Portable 1v  09/07/2015  CLINICAL DATA:  Cardiac arrest, orogastric tube placement EXAM: PORTABLE ABDOMEN - 1 VIEW COMPARISON:  None. FINDINGS: Orogastric tube projects over the stomach with tip in the region of the antrum. IMPRESSION: OG tube as described Electronically Signed   By: Skipper Cliche M.D.   On: 09/13/2015 15:54     STUDIES:  CT head>> mild atrophy CC 1/7>> no intervention but severe LV dysfunction and severe native and graft disease  CULTURES: none  ANTIBIOTICS: none  SIGNIFICANT EVENTS: 1/7cardiac arrest  LINES/TUBES: 1/7 et>>  DISCUSSION: 70  yo with cad post arrest  ASSESSMENT / PLAN:  PULMONARY A: VDRF post arrest Suspected aspiration  P:   Vent bundle RR adjusted for acidosis Maintain PEEP  CARDIOVASCULAR A:  Post cardiac  arrest while shoveling snow. CPR mxm10 min epi x 1 CC post ct head  P:  Per Cards  RENAL A:   AKI, oliguric Metab acidosis Hypokalemia P:   Bicarb gtt Replete lytes   GASTROINTESTINAL A:   GI protection P:   PPI  HEMATOLOGIC A:   Anticoagulation per cardiology  P:  PTT for heparin  INFECTIOUS A:   No apparent infectious process but possible aspiration P:   Add empiric zosyn  ENDOCRINE A:   Hyperglycemia P:   Insulin gtt  NEUROLOGIC A:   Currently sedated post intubation following cardiac arrest while shoveling snow. At risk anoxia P:   RASS goal: -5    FAMILY  - Updates: no family at bedside  - Inter-disciplinary family meet or Palliative Care meeting due by:  day 7   Summary - Prognosis guarded, he is on max pressors with AKI & PEEP for ARDS/ post arrest.    The patient is critically ill with multiple organ systems failure and requires high complexity decision making for assessment and support, frequent evaluation and titration of therapies, application of advanced monitoring technologies and extensive interpretation of multiple databases. Critical Care Time devoted to patient care services described in this note independent of APP time is 40 minutes.   Kara Mead MD. Shade Flood. [facility] Pager [phone] If no response call [phone]     09/05/2015, 7:56 AM

## 2015-09-05 NOTE — Progress Notes (Signed)
Paged [place] re: K 3.5. Will continue to monitor

## 2015-09-05 NOTE — Progress Notes (Signed)
Paged Dr. Susy Manor to notify of troponin increase to 24.24. Will continue to monitor.

## 2015-09-05 NOTE — Progress Notes (Signed)
ANTICOAGULATION CONSULT NOTE   Pharmacy Consult for Heparin  Indication: chest pain/ACS, hypothermia protocol, s/p cath  No Known Allergies  Patient Measurements: Height: 5\' 9"  (175.3 cm) Weight: 179 lb 7.6 oz (81.41 kg) IBW/kg (Calculated) : 70.7  Vital Signs: Temp: 92.7 F (33.7 C) (01/08 1100) Temp Source: Core (Comment) (01/08 0900) BP: 130/70 mmHg (01/08 1203) Pulse Rate: 41 (01/08 1203)  Labs:  Recent Labs  09/26/2015 1520 09/12/2015 1522 09/03/2015 1845  09/12/2015 2344 09/05/15 0020 09/05/15 0120 09/05/15 0424 09/05/15 0620 09/05/15 1100  HGB 13.0 14.6 14.6  --   --   --   --  13.5  --   --   HCT 40.6 43.0 46.1  --   --   --   --  42.7  --   --   PLT 411*  --  471*  --   --   --   --  372  --   --   APTT  --   --  156*  --   --  34  --  44*  --   --   LABPROT 17.1*  --  19.5*  --   --  18.1*  --  18.2*  --   --   INR 1.38  --  1.65*  --   --  1.49  --  1.50*  --   --   HEPARINUNFRC  --   --   --   --   --   --   --   --   --  0.28*  CREATININE 1.19 1.00  --   < > 1.09  --  1.17 1.37*  --   --   TROPONINI  --   --  5.96*  < > 24.24*  --   --  60.18* 64.90*  --   < > = values in this interval not displayed.  Estimated Creatinine Clearance: 50.9 mL/min (by C-G formula based on Cr of 1.37).   Medical History: Past Medical History  Diagnosis Date  . Congestive cardiac failure ([place])   . Arthritis   . Hypertension   . Hyperlipidemia   . MI (myocardial infarction) [facility])     Assessment: 70 y/o M s/p cardiac arrest while shoveling snow, now s/p cath. Heparin started 6 hours post-sheath removal (~0020).   HL therapeutic on 800 units/hr.  Goal of Therapy:  Heparin level 0.2-0.5 units/mL Monitor platelets by anticoagulation protocol: Yes   Plan:  -Continue heparin drip at 800 units/hr  -6 HL to confirm (goal 0.2-0.5)  -Daily CBC/HL  -Monitor for bleeding   Joya San, PharmD Clinical Pharmacy Resident Pager # [phone] 09/05/2015 12:38 PM

## 2015-09-05 NOTE — Progress Notes (Signed)
SUBJECTIVE:  Intubated and sedated  OBJECTIVE:   Vitals:   Filed Vitals:   09/05/15 0630 09/05/15 0645 09/05/15 0700 09/05/15 0751  BP:    99/61  Pulse: 40 42 44 47  Temp:    91.8 F (33.2 C)  TempSrc:    Core (Comment)  Resp: 30 30 30 30   Height:      Weight:      SpO2: 100% 100% 100% 99%   I&O's:   Intake/Output Summary (Last 24 hours) at 09/05/15 0904 Last data filed at 09/05/15 0600  Gross per 24 hour  Intake 2502.45 ml  Output   1040 ml  Net 1462.45 ml   TELEMETRY: Reviewed telemetry pt in sinus bradycardia with LBBB     PHYSICAL EXAM General: Well developed, well nourished, intubated and sedated Lungs:   Clear bilaterally to auscultation and percussion. Heart:   HRRR S1 S2 Pulses are 2+ & equal. Abdomen: Bowel sounds are positive, abdomen soft and non-tender without masses  Extremities:   No clubbing, cyanosis or edema.  DP +1   LABS: Basic Metabolic Panel:  Recent Labs  09/16/2015 1845  09/05/15 0120 09/05/15 0424  NA  --   < > 135 137  K  --   < > 3.5 2.8*  CL  --   < > 105 107  CO2  --   < > 16* 18*  GLUCOSE  --   < > 238* 211*  BUN  --   < > 23* 23*  CREATININE  --   < > 1.17 1.37*  CALCIUM  --   < > 7.7* 7.7*  MG 1.8  --   --  2.5*  PHOS 5.9*  --   --   --   < > = values in this interval not displayed. Liver Function Tests:  Recent Labs  09/20/2015 1520  AST 145*  ALT 137*  ALKPHOS 87  BILITOT 0.8  PROT 6.3*  ALBUMIN 3.2*   No results for input(s): LIPASE, AMYLASE in the last 72 hours. CBC:  Recent Labs  09/18/2015 1520  08/30/2015 1845 09/05/15 0424  WBC 21.3*  --  17.9* 10.5  NEUTROABS 17.9*  --   --  9.0*  HGB 13.0  < > 14.6 13.5  HCT 40.6  < > 46.1 42.7  MCV 80.9  --  80.5 79.1  PLT 411*  --  471* 372  < > = values in this interval not displayed. Cardiac Enzymes:  Recent Labs  09/23/2015 2344 09/05/15 0424 09/05/15 0620  TROPONINI 24.24* 60.18* 64.90*   BNP: Invalid input(s): POCBNP D-Dimer: No results for  input(s): DDIMER in the last 72 hours. Hemoglobin A1C: No results for input(s): HGBA1C in the last 72 hours. Fasting Lipid Panel: No results for input(s): CHOL, HDL, LDLCALC, TRIG, CHOLHDL, LDLDIRECT in the last 72 hours. Thyroid Function Tests: No results for input(s): TSH, T4TOTAL, T3FREE, THYROIDAB in the last 72 hours.  Invalid input(s): FREET3 Anemia Panel: No results for input(s): VITAMINB12, FOLATE, FERRITIN, TIBC, IRON, RETICCTPCT in the last 72 hours. Coag Panel:   Lab Results  Component Value Date   INR 1.50* 09/05/2015   INR 1.49 09/05/2015   INR 1.65* 09/03/2015    RADIOLOGY: Ct Head Wo Contrast  09/23/2015  CLINICAL DATA:  Cardiac arrest today. Intubated and going to cath lab. EXAM: CT HEAD WITHOUT CONTRAST TECHNIQUE: Contiguous axial images were obtained from the base of the skull through the vertex without intravenous contrast. COMPARISON:  None.  FINDINGS: Patient is status post a previous left frontoparietal craniectomy. There is mild generalized brain atrophy with commensurate dilatation of the ventricles and sulci. Mild chronic small vessel ischemic change noted within the bilateral periventricular white matter regions. There is no mass, hemorrhage, edema or other evidence of acute parenchymal abnormality. No extra-axial hemorrhage. There are chronic calcified atherosclerotic changes of the large vessels at the skull base. No acute osseous abnormality. Mild mucosal thickening noted within the ethmoid air cells. Small fluid levels within the sphenoid sinuses. Mastoid air cells are clear. Superficial soft tissues are unremarkable. IMPRESSION: 1. Mild atrophy and chronic ischemic changes in the deep periventricular white matter. 2. No evidence of acute intracranial abnormality. No intracranial mass, hemorrhage or edema. 3. Surgical changes of a previous left frontoparietal craniectomy. 4. Mild paranasal sinus disease. Electronically Signed   By: Franki Cabot M.D.   On: 09/25/2015  16:56   Dg Chest Port 1 View  09/05/2015  CLINICAL DATA:  Patient with respiratory failure. EXAM: PORTABLE CHEST 1 VIEW COMPARISON:  Chest radiograph 09/03/2015. FINDINGS: Pacer pad overlies the mediastinum. ET tube terminates in mid trachea. Left IJ central venous catheter tip projects over the superior vena cava. Enteric tube courses inferior to the diaphragm. Stable enlarged cardiac and mediastinal contours status post median sternotomy and CABG procedure. Interval worsening of diffuse bilateral airspace opacities. No definite pleural effusion or pneumothorax. IMPRESSION: Stable support apparatus. Interval worsening diffuse bilateral airspace opacities which may represent edema or pneumonia. Electronically Signed   By: Lovey Newcomer M.D.   On: 09/05/2015 08:27   Dg Chest Port 1 View  09/14/2015  CLINICAL DATA:  Evaluate nasogastric tube and central line placement. EXAM: PORTABLE CHEST 1 VIEW COMPARISON:  09/23/2015 at 1531 hours FINDINGS: New left internal jugular central venous line has been placed. Tip projects in the mid to lower superior vena cava. There is no pneumothorax. Nasogastric tube is incompletely imaged. It passes below the included field of view but is likely within the stomach. Endotracheal tube is well positioned, tip projecting 3.4 cm above the carina. Lungs show hazy bilateral airspace opacities, which are upper lobe and centrally predominant, likely pulmonary edema. IMPRESSION: 1. Left central venous line tip projects in the mid to lower superior vena cava. No pneumothorax. 2. Nasogastric tube not completely visualized. Likely extends into the stomach. 3. Hazy bilateral airspace lung opacities likely due to pulmonary edema. This has increased when compared to the earlier study. Electronically Signed   By: Lajean Manes M.D.   On: 08/30/2015 21:44   Dg Chest Port 1 View  09/06/2015  CLINICAL DATA:  Patient with history of cardiac arrest. Status post intubation. EXAM: PORTABLE CHEST 1 VIEW  COMPARISON:  Chest radiograph 09/29/2009 FINDINGS: ET tube terminates in the mid trachea. Enteric tube courses inferior to the diaphragm. Pacer pad overlies the chest. Stable enlarged cardiac and mediastinal contours status post median sternotomy and CABG procedure. Diffuse bilateral predominately perihilar interstitial pulmonary opacities. No pleural effusion or pneumothorax. Regional skeleton is unremarkable. IMPRESSION: ET tube terminates in the mid trachea. Perihilar interstitial opacities most compatible with edema. Cardiomegaly. Electronically Signed   By: Lovey Newcomer M.D.   On: 09/03/2015 15:55   Dg Abd Portable 1v  09/12/2015  CLINICAL DATA:  Orogastric tube placement.  Initial encounter. EXAM: PORTABLE ABDOMEN - 1 VIEW COMPARISON:  Abdominal radiograph performed earlier today at 3:34 p.m. FINDINGS: The patient's enteric tube is noted ending overlying the antrum of the stomach. The visualized bowel gas pattern is  unremarkable. Scattered air and stool filled loops of colon are seen; no abnormal dilatation of small bowel loops is seen to suggest small bowel obstruction. No free intra-abdominal air is identified, though evaluation for free air is limited on a single supine view. Contrast is noted within the bladder, with a Foley catheter noted in place. The visualized osseous structures are within normal limits; the sacroiliac joints are unremarkable in appearance. The visualized lung bases are essentially clear. IMPRESSION: Enteric tube noted ending overlying the antrum of the stomach. Electronically Signed   By: Garald Balding M.D.   On: 09/25/2015 21:44   Dg Abd Portable 1v  09/24/2015  CLINICAL DATA:  Cardiac arrest, orogastric tube placement EXAM: PORTABLE ABDOMEN - 1 VIEW COMPARISON:  None. FINDINGS: Orogastric tube projects over the stomach with tip in the region of the antrum. IMPRESSION: OG tube as described Electronically Signed   By: Skipper Cliche M.D.   On: 09/25/2015 15:54    ASSESSMENT/PLAN: Principal Problem:  Cardiac arrest while shoveling snow Active Problems:  H/O CABG x 4 2008  VF shocked twice in field  Essential hypertension  PAF- NSR at LOV- now in AF with RVR post arrest  HLD (hyperlipidemia)  LBBB (left bundle branch block)  1.  Vfib cardiac arrest in setting of shoveling snow - Most likely ischemic arrhythmia.  Cath showed showed severe native ASCAD with total occlusion of the distal RCA and prox LAD, total occlusion of digonals and total occlusion of 1 OM and severe disease in another, occluded SVG to PDA and SVG to LCx and high grade stenosis of SVG to D1 > 95% not amenable to PCI and patent LIMA to LAD.  Patient had recurrent VFib x 2 in cath lab.  Not a candidate for redo CABG or PCI. Overall prognosis is poor.  On Levophed and Vasopressin for pressor support.  Currently on hypothermia protocol per CCM.  Continue IV Amio//IV Heparin gtt. Titrate pressors as needed.  2.  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  Check 2D echo today. 3.  ASCAD s/p CABG - see above cath.  Continue ASA/BB/statin 4.  Chronic LBBB 5.  HTN - BP stable 6.  Dyslipidemia - continue statin 7.  Hypokalemia - replete per CCM 8.  VDRF secondary to #1 with chest xray this am showing diffuse bilateral airspace opacities ? PNA vs. Edema.  CVP running around 68mmHg.  Most likely aspiration PNA.  Antibx per CCM.  9.  Acute oliguric kidney injury and acidotic currently on bicarb gtt.  Patrick Margarita, MD  09/05/2015  9:04 AM

## 2015-09-05 NOTE — Progress Notes (Signed)
E-link MD called regarding MAP of 65 despite increases in levophed. Orders for vasopressin to be placed by MD. Will continue to monitor.

## 2015-09-05 NOTE — Progress Notes (Signed)
ANTICOAGULATION CONSULT NOTE - Initial Consult  Pharmacy Consult for Heparin  Indication: chest pain/ACS, hypothermia protocol, s/p cath  No Known Allergies  Patient Measurements: Height: 5\' 9"  (175.3 cm) Weight: 175 lb (79.379 kg) IBW/kg (Calculated) : 70.7  Vital Signs: Temp: 89.6 F (32 C) (01/07 2330) Temp Source: Core (Comment) (01/07 1930) BP: 90/66 mmHg (01/07 2000) Pulse Rate: 41 (01/08 0000)  Labs:  Recent Labs  09/20/2015 1520 09/23/2015 1522 09/08/2015 1845 09/13/2015 1935 09/16/2015 2133 09/16/2015 2236  HGB 13.0 14.6 14.6  --   --   --   HCT 40.6 43.0 46.1  --   --   --   PLT 411*  --  471*  --   --   --   APTT  --   --  156*  --   --   --   LABPROT 17.1*  --  19.5*  --   --   --   INR 1.38  --  1.65*  --   --   --   CREATININE 1.19 1.00  --  0.88  --  1.09  TROPONINI  --   --  5.96*  --  16.91*  --     Estimated Creatinine Clearance: 64 mL/min (by C-G formula based on Cr of 1.09).   Medical History: Past Medical History  Diagnosis Date  . Congestive cardiac failure ([place])   . Arthritis   . Hypertension   . Hyperlipidemia   . MI (myocardial infarction) [facility])     Assessment: 70 y/o M s/p cardiac arrest while shoveling snow, now s/p cath, starting 33 degree hypothermia protocol, to start heparin 6 hours after sheath pull per discussion with cardiology fellow, sheath is being pulled now (~0020), CBC good, renal function appears ok but may worsen s/p arrest, other labs reviewed.   Goal of Therapy:  Heparin level 0.2-0.5 units/mL Monitor platelets by anticoagulation protocol: Yes   Plan:  -No bolus s/p cath -Start heparin drip at 800 units/hr at 0600 -1200 HL -Daily CBC/HL -Monitor for bleeding   Narda Bonds 09/05/2015,12:19 AM

## 2015-09-05 NOTE — Progress Notes (Signed)
CRITICAL VALUE ALERT  Critical value received:  Lactic 4.8  Date of notification:  09/05/15  Time of notification:  0024  Critical value read back:yes  Nurse who received alert:  Katrine Coho  MD notified (1st page):  E-link MD  Time of first page:  0107  Responding MD:  E-link MD  Time MD responded:  [email or web address]  MD also updated on potassium of 4.3

## 2015-09-05 NOTE — Progress Notes (Signed)
[place] for Heparin  Indication: chest pain/ACS, hypothermia protocol, s/p cath  No Known Allergies  Patient Measurements: Height: 5\' 9"  (175.3 cm) Weight: 179 lb 7.6 oz (81.41 kg) IBW/kg (Calculated) : 70.7  Vital Signs: Temp: 91.9 F (33.3 C) (01/08 1600) Temp Source: Core (Comment) (01/08 1600) BP: 142/75 mmHg (01/08 1630) Pulse Rate: 48 (01/08 1630)  Labs:  Recent Labs  09/06/2015 1520  09/25/2015 1845  09/05/15 0020  09/05/15 0424 09/05/15 0620 09/05/15 0908 09/05/15 1100 09/05/15 1240 09/05/15 1645 09/05/15 1715  HGB 13.0  < > 14.6  --   --   --  13.5  --  14.3  --  14.3  --  16.3  HCT 40.6  < > 46.1  --   --   --  42.7  --  42.0  --  42.0  --  48.0  PLT 411*  --  471*  --   --   --  372  --   --   --   --   --   --   APTT  --   --  156*  --  34  --  44*  --   --   --   --   --   --   LABPROT 17.1*  --  19.5*  --  18.1*  --  18.2*  --   --   --   --   --   --   INR 1.38  --  1.65*  --  1.49  --  1.50*  --   --   --   --   --   --   HEPARINUNFRC  --   --   --   --   --   --   --   --   --  0.28*  --  0.24*  --   CREATININE 1.19  < >  --   < >  --   < > 1.37*  --  1.20  --  1.20  --  1.00  TROPONINI  --   --  5.96*  < >  --   --  60.18* 64.90*  --  >65.00*  --   --   --   < > = values in this interval not displayed.  Estimated Creatinine Clearance: 69.7 mL/min (by C-G formula based on Cr of 1).   Medical History: Past Medical History  Diagnosis Date  . Congestive cardiac failure ([place])   . Arthritis   . Hypertension   . Hyperlipidemia   . MI (myocardial infarction) [facility])     Assessment: 70 y/o M s/p cardiac arrest while shoveling snow, now s/p cath. Heparin started 6 hours post-sheath removal (~0020).   HL therapeutic on 800 units/hr.  Goal of Therapy:  Heparin level 0.2-0.5 units/mL Monitor platelets by anticoagulation protocol: Yes   Plan:  -Continue heparin drip at 800 units/hr  -Daily CBC/HL  -Monitor for  bleeding   Levester Fresh, PharmD, BCPS, [facility] Clinical Pharmacist Pager [phone] 09/05/2015 5:31 PM

## 2015-09-05 NOTE — Progress Notes (Signed)
Utilization review completed.  

## 2015-09-06 ENCOUNTER — Inpatient Hospital Stay (HOSPITAL_COMMUNITY): Payer: Medicare Other

## 2015-09-06 ENCOUNTER — Encounter (HOSPITAL_COMMUNITY): Payer: Self-pay | Admitting: Interventional Cardiology

## 2015-09-06 DIAGNOSIS — I447 Left bundle-branch block, unspecified: Secondary | ICD-10-CM

## 2015-09-06 DIAGNOSIS — J690 Pneumonitis due to inhalation of food and vomit: Secondary | ICD-10-CM | POA: Insufficient documentation

## 2015-09-06 DIAGNOSIS — J9600 Acute respiratory failure, unspecified whether with hypoxia or hypercapnia: Secondary | ICD-10-CM | POA: Insufficient documentation

## 2015-09-06 LAB — GLUCOSE, CAPILLARY
GLUCOSE-CAPILLARY: 145 mg/dL — AB (ref 65–99)
GLUCOSE-CAPILLARY: 150 mg/dL — AB (ref 65–99)
GLUCOSE-CAPILLARY: 74 mg/dL (ref 65–99)
GLUCOSE-CAPILLARY: 73 mg/dL (ref 65–99)
Glucose-Capillary: 137 mg/dL — ABNORMAL HIGH (ref 65–99)
Glucose-Capillary: 148 mg/dL — ABNORMAL HIGH (ref 65–99)
Glucose-Capillary: 136 mg/dL — ABNORMAL HIGH (ref 65–99)
Glucose-Capillary: 121 mg/dL — ABNORMAL HIGH (ref 65–99)
Glucose-Capillary: 86 mg/dL (ref 65–99)

## 2015-09-06 LAB — BASIC METABOLIC PANEL
ANION GAP: 16 — AB (ref 5–15)
Anion gap: 13 (ref 5–15)
BUN: 27 mg/dL — ABNORMAL HIGH (ref 6–20)
BUN: 26 mg/dL — ABNORMAL HIGH (ref 6–20)
CALCIUM: 7.1 mg/dL — AB (ref 8.9–10.3)
CHLORIDE: 100 mmol/L — AB (ref 101–111)
CO2: 21 mmol/L — ABNORMAL LOW (ref 22–32)
CO2: 23 mmol/L (ref 22–32)
CREATININE: 1.18 mg/dL (ref 0.61–1.24)
Calcium: 6.9 mg/dL — ABNORMAL LOW (ref 8.9–10.3)
Chloride: 100 mmol/L — ABNORMAL LOW (ref 101–111)
Creatinine, Ser: 1.16 mg/dL (ref 0.61–1.24)
GFR calc Af Amer: >60 mL/min (ref >60)
GFR calc non Af Amer: >60 mL/min (ref >60)
GLUCOSE: 163 mg/dL — AB (ref 65–99)
Glucose, Bld: 143 mg/dL — ABNORMAL HIGH (ref 65–99)
Potassium: 3.4 mmol/L — ABNORMAL LOW (ref 3.5–5.1)
Potassium: 3.3 mmol/L — ABNORMAL LOW (ref 3.5–5.1)
SODIUM: 137 mmol/L (ref 135–145)
Sodium: 136 mmol/L (ref 135–145)

## 2015-09-06 LAB — CBC
HCT: 35.3 % — ABNORMAL LOW (ref 39.0–52.0)
Hemoglobin: 11.6 g/dL — ABNORMAL LOW (ref 13.0–17.0)
MCH: 24.9 pg — AB (ref 26.0–34.0)
MCHC: 32.9 g/dL (ref 30.0–36.0)
MCV: 75.9 fL — AB (ref 78.0–100.0)
PLATELETS: 261 10*3/uL (ref 150–400)
RBC: 4.65 MIL/uL (ref 4.22–5.81)
RDW: 14.7 % (ref 11.5–15.5)
WBC: 12.1 10*3/uL — ABNORMAL HIGH (ref 4.0–10.5)

## 2015-09-06 LAB — PHOSPHORUS: Phosphorus: 3.4 mg/dL (ref 2.5–4.6)

## 2015-09-06 LAB — HEPATIC FUNCTION PANEL
ALBUMIN: 2 g/dL — AB (ref 3.5–5.0)
ALT: 172 U/L — ABNORMAL HIGH (ref 17–63)
AST: 232 U/L — AB (ref 15–41)
Alkaline Phosphatase: 63 U/L (ref 38–126)
BILIRUBIN DIRECT: 0.4 mg/dL (ref 0.1–0.5)
Indirect Bilirubin: 0.6 mg/dL (ref 0.3–0.9)
Total Bilirubin: 1 mg/dL (ref 0.3–1.2)
Total Protein: 4.6 g/dL — ABNORMAL LOW (ref 6.5–8.1)

## 2015-09-06 LAB — URINE CULTURE
Culture: NO GROWTH
Special Requests: NORMAL

## 2015-09-06 LAB — HEPARIN LEVEL (UNFRACTIONATED)
HEPARIN UNFRACTIONATED: 0.16 [IU]/mL — AB (ref 0.30–0.70)
Heparin Unfractionated: 0.1 IU/mL — ABNORMAL LOW (ref 0.30–0.70)
Heparin Unfractionated: 0.13 IU/mL — ABNORMAL LOW (ref 0.30–0.70)

## 2015-09-06 LAB — CARBOXYHEMOGLOBIN
Carboxyhemoglobin: 1 % (ref 0.5–1.5)
METHEMOGLOBIN: 0.6 % (ref 0.0–1.5)
O2 Saturation: 62.7 %
Total hemoglobin: 12.2 g/dL — ABNORMAL LOW (ref 13.5–18.0)

## 2015-09-06 LAB — MAGNESIUM: Magnesium: 1.6 mg/dL — ABNORMAL LOW (ref 1.7–2.4)

## 2015-09-06 LAB — T4, FREE: FREE T4: 1.39 ng/dL — AB (ref 0.61–1.12)

## 2015-09-06 LAB — TSH: TSH: 15.569 u[IU]/mL — AB (ref 0.350–4.500)

## 2015-09-06 MED ORDER — MAGNESIUM SULFATE 2 GM/50ML IV SOLN
2.0000 g | Freq: Once | INTRAVENOUS | Status: AC
  Administered 2015-09-06: 2 g via INTRAVENOUS
  Filled 2015-09-06: qty 50

## 2015-09-06 MED ORDER — FENTANYL CITRATE (PF) 100 MCG/2ML IJ SOLN
50.0000 ug | Freq: Once | INTRAMUSCULAR | Status: AC
  Administered 2015-09-06: 50 ug via INTRAVENOUS

## 2015-09-06 MED ORDER — VITAL HIGH PROTEIN PO LIQD: 1000.0000 mL | ORAL | Status: DC

## 2015-09-06 MED ORDER — MIDAZOLAM HCL 2 MG/2ML IJ SOLN
2.0000 mg | INTRAMUSCULAR | Status: DC | PRN
  Administered 2015-09-07: 2 mg via INTRAVENOUS
  Filled 2015-09-06: qty 2

## 2015-09-06 MED ORDER — PIPERACILLIN-TAZOBACTAM 3.375 G IVPB 30 MIN
3.3750 g | Freq: Once | INTRAVENOUS | Status: AC
  Administered 2015-09-06: 3.375 g via INTRAVENOUS
  Filled 2015-09-06: qty 50

## 2015-09-06 MED ORDER — POTASSIUM CHLORIDE 20 MEQ/15ML (10%) PO SOLN
40.0000 meq | Freq: Once | ORAL | Status: AC
  Administered 2015-09-06: 40 meq via ORAL
  Filled 2015-09-06: qty 30

## 2015-09-06 MED ORDER — VANCOMYCIN HCL 10 G IV SOLR
1500.0000 mg | Freq: Once | INTRAVENOUS | Status: AC
  Administered 2015-09-06: 1500 mg via INTRAVENOUS
  Filled 2015-09-06: qty 1500

## 2015-09-06 MED ORDER — VITAL AF 1.2 CAL PO LIQD
1000.0000 mL | ORAL | Status: DC
  Administered 2015-09-06 – 2015-09-09 (×4): 1000 mL

## 2015-09-06 MED ORDER — HYDROCORTISONE NA SUCCINATE PF 100 MG IJ SOLR
50.0000 mg | Freq: Four times a day (QID) | INTRAMUSCULAR | Status: DC
  Administered 2015-09-06 – 2015-09-16 (×40): 50 mg via INTRAVENOUS
  Filled 2015-09-06 (×41): qty 2

## 2015-09-06 MED ORDER — VANCOMYCIN HCL IN DEXTROSE 750-5 MG/150ML-% IV SOLN
750.0000 mg | Freq: Two times a day (BID) | INTRAVENOUS | Status: DC
  Administered 2015-09-07 (×3): 750 mg via INTRAVENOUS
  Filled 2015-09-06 (×4): qty 150

## 2015-09-06 MED ORDER — HEPARIN (PORCINE) IN NACL 100-0.45 UNIT/ML-% IJ SOLN
1500.0000 [IU]/h | INTRAMUSCULAR | Status: DC
  Administered 2015-09-07: 1400 [IU]/h via INTRAVENOUS
  Administered 2015-09-09: 1600 [IU]/h via INTRAVENOUS
  Administered 2015-09-10: 1500 [IU]/h via INTRAVENOUS
  Administered 2015-09-10: 1550 [IU]/h via INTRAVENOUS
  Filled 2015-09-06 (×7): qty 250

## 2015-09-06 MED ORDER — FENTANYL CITRATE (PF) 2500 MCG/50ML IJ SOLN
25.0000 ug/h | INTRAMUSCULAR | Status: DC
  Administered 2015-09-06: 300 ug/h via INTRAVENOUS
  Administered 2015-09-06: 250 ug/h via INTRAVENOUS
  Administered 2015-09-07 – 2015-09-10 (×8): 300 ug/h via INTRAVENOUS
  Administered 2015-09-11 (×3): 400 ug/h via INTRAVENOUS
  Administered 2015-09-11: 300 ug/h via INTRAVENOUS
  Administered 2015-09-12 – 2015-09-13 (×7): 400 ug/h via INTRAVENOUS
  Administered 2015-09-14 (×3): 350 ug/h via INTRAVENOUS
  Administered 2015-09-15: 50 ug/h via INTRAVENOUS
  Administered 2015-09-15: 350 ug/h via INTRAVENOUS
  Administered 2015-09-16: 200 ug/h via INTRAVENOUS
  Administered 2015-09-17 (×2): 300 ug/h via INTRAVENOUS
  Administered 2015-09-17: 150 ug/h via INTRAVENOUS
  Administered 2015-09-18 – 2015-09-20 (×8): 400 ug/h via INTRAVENOUS
  Administered 2015-09-21: 200 ug/h via INTRAVENOUS
  Filled 2015-09-06 (×45): qty 50

## 2015-09-06 MED ORDER — FENTANYL BOLUS VIA INFUSION
25.0000 ug | INTRAVENOUS | Status: DC | PRN
Start: 2015-09-06 — End: 2015-09-10
  Administered 2015-09-07 – 2015-09-08 (×5): 25 ug via INTRAVENOUS
  Filled 2015-09-06: qty 25

## 2015-09-06 MED ORDER — PIPERACILLIN-TAZOBACTAM 3.375 G IVPB
3.3750 g | Freq: Three times a day (TID) | INTRAVENOUS | Status: DC
  Administered 2015-09-06 – 2015-09-08 (×6): 3.375 g via INTRAVENOUS
  Filled 2015-09-06 (×10): qty 50

## 2015-09-06 NOTE — Procedures (Signed)
ELECTROENCEPHALOGRAM REPORT  Patient: Patrick Sexton       Room #: [id number] EEG No. ID: [id number] Age: 70 y.o.        Sex: male Referring Physician: Elsworth Soho, R Report Date:  09/06/2015        Interpreting Physician: Anthony Sar  History: Patrick Sexton is an 70 y.o. male with a history of coronary artery disease who experienced cardiac arrest on 09/20/2015 while shoveling snow.   Indications for study:  Assess severity of encephalopathy; rule out seizure activity.  Technique: This is an 18 channel routine scalp EEG performed at the bedside with bipolar and monopolar montages arranged in accordance to the international 10/20 system of electrode placement.   Description: Patient was intubated and on mechanical ventilation at the time of this study. Predominant activity consisted of low to moderate amplitude diffuse mixed delta and theta. Amplitude of activity recorded from the area of patient's left medial frontal defect was slightly higher than background activity amplitude. Photic stimulation was not performed. No epileptiform discharges were recorded.  Interpretation: This EEG recording was abnormal with moderate generalized nonspecific continuous slowing of cerebral activity. No evidence of an epileptic disorder was demonstrated.   Rush Farmer M.D. [facility] [phone]

## 2015-09-06 NOTE — Progress Notes (Addendum)
ANTICOAGULATION CONSULT NOTE   Pharmacy Consult for Heparin  Indication: chest pain/ACS, hypothermia protocol, s/p cath  No Known Allergies  Patient Measurements: Height: 5\' 9"  (175.3 cm) Weight: 190 lb 14 oz (86.58 kg) (bed wt (89kg) - cooling pad wt (2.07kg + 0.35kg)) IBW/kg (Calculated) : 70.7  Vital Signs: Temp: 96.8 F (36 C) (01/09 1300) Temp Source: Core (Comment) (01/09 1300) BP: 107/65 mmHg (01/09 1300) Pulse Rate: 64 (01/09 1300)  Labs:  Recent Labs  09/22/2015 1845  09/05/15 0020  09/05/15 0424 09/05/15 0620  09/05/15 1100 09/05/15 1240 09/05/15 1645 09/05/15 1715 09/05/15 2125 09/06/15 0127 09/06/15 0525 09/06/15 0940 09/06/15 1350  HGB 14.6  --   --   --  13.5  --   < >  --  14.3  --  16.3  --   --  11.6*  --   --   HCT 46.1  --   --   --  42.7  --   < >  --  42.0  --  48.0  --   --  35.3*  --   --   PLT 471*  --   --   --  372  --   --   --   --   --   --   --   --  261  --   --   APTT 156*  --  34  --  44*  --   --   --   --   --   --   --   --   --   --   --   LABPROT 19.5*  --  18.1*  --  18.2*  --   --   --   --   --   --   --   --   --   --   --   INR 1.65*  --  1.49  --  1.50*  --   --   --   --   --   --   --   --   --   --   --   HEPARINUNFRC  --   --   --   --   --   --   < > 0.28*  --  0.24*  --   --   --   --  0.10* 0.13*  CREATININE  --   < >  --   < > 1.37*  --   < >  --  1.20  --  1.00 1.20 1.16 1.18  --   --   TROPONINI 5.96*  < >  --   --  60.18* 64.90*  --  >65.00*  --   --   --   --   --   --   --   --   < > = values in this interval not displayed.  Estimated Creatinine Clearance: 64.4 mL/min (by C-G formula based on Cr of 1.18).   Medical History: Past Medical History  Diagnosis Date  . Congestive cardiac failure ([place])   . Arthritis   . Hypertension   . Hyperlipidemia   . MI (myocardial infarction) [facility])    Assessment: 70 y/o M s/p cardiac arrest while shoveling snow, now s/p cath. Heparin started 6 hours post-sheath removal  (~0020). Rewarming from 33 degree hypothermia protocol completed at 1015 on 1/9. Heparin level is now subtherapeutic at 0.1 on 800 units/hr.  Hgb 14.6 > 11.6, Plt  471 > 261. Likely dilutional drop as pt received 6 L fluid yesterday w/ < 1 L output. No reports of bleeding.  Goal of Therapy:  Heparin level 0.2-0.5 units/mL Monitor platelets by anticoagulation protocol: Yes   Plan:  - Increase heparin drip to 1000 units/hr  - Recheck HL in 4 hr - Daily CBC/HL  - Monitor for bleeding   Governor Specking, PharmD Clinical Pharmacy Resident Pager: [phone]  09/06/2015 2:55 PM   ADDENDUM --------------------------------------------------------------------------------  - repeat 4 hr heparin level drawn 2.5 hr after level increase = 0.13 - Will increase slightly to 1100 units/hr and recheck in 6 hr  Governor Specking, PharmD Clinical Pharmacy Resident Pager: [phone] 3:33 PM

## 2015-09-06 NOTE — Progress Notes (Signed)
Initial Nutrition Assessment  INTERVENTION:  Initiate Vital AF 1.2 @ 20 ml/hr via OG tube and increase by 10 ml every 4 hours to goal rate of 70 ml/hr.   Tube feeding regimen provides 2016 kcal (98% of needs), 126 grams of protein, and 1362 ml of H2O.   NUTRITION DIAGNOSIS:   Inadequate oral intake related to inability to eat as evidenced by NPO status.  GOAL:   Patient will meet greater than or equal to 90% of their needs  MONITOR:   Labs, Vent status, TF tolerance, I & O's  REASON FOR ASSESSMENT:   Consult Enteral/tube feeding initiation and management  ASSESSMENT:   Pt with hx of severe heart disease. Admitted after arrest while shovelling snow. SCD/VF arrest/ CPR/cath/ [place].    Patient is currently intubated on ventilator support MV: 16.8 L/min Temp (24hrs), Avg:94.6 F (34.8 C), Min:89.6 F (32 C), Max:99.5 F (37.5 C)  OG tube Labs reviewed: potassium (3.3) and magnesium (1.6) low Nutrition-Focused physical exam completed. Findings are no fat depletion, no muscle depletion, and mild edema.  Discussed with RN.    Diet Order:  Diet NPO time specified  Skin:  Reviewed, no issues  Last BM:  unknown  Height:   Ht Readings from Last 1 Encounters:  09/20/2015 5\' 9"  (1.753 m)   Weight:   Wt Readings from Last 1 Encounters:  09/06/15 190 lb 14 oz (86.58 kg)  Admission weight 175 lb   Ideal Body Weight:  72.7 kg  BMI:  Body mass index is 28.17 kg/(m^2).  Estimated Nutritional Needs:   Kcal:  2062  Protein:  95-110 grams  Fluid:  > 2 L/day  EDUCATION NEEDS:   No education needs identified at this time  [place], [place], [place] Pager [phone] After Hours Pager

## 2015-09-06 NOTE — Progress Notes (Signed)
EEG completed, results pending. 

## 2015-09-06 NOTE — Progress Notes (Signed)
Subjective:  POD # 2 SCD/VF arrest/ CPR/cath/ [place] [place]. Intubated, sedated and paralyzed. Rewarming today  Objective:  Temp:  [88.9 F (31.6 C)-96.3 F (35.7 C)] 96.3 F (35.7 C) (01/09 0700) Pulse Rate:  [6-75] 75 (01/09 0700) Resp:  [0-30] 30 (01/09 0700) BP: (99-142)/(61-88) 127/88 mmHg (01/09 0600) SpO2:  [98 %-100 %] 100 % (01/09 0700) FiO2 (%):  [50 %-100 %] 50 % (01/09 0400) Weight:  [190 lb 14 oz (86.58 kg)] 190 lb 14 oz (86.58 kg) (01/09 0600) Weight change: 15 lb 14 oz (7.201 kg)  Intake/Output from previous day: 01/08 0701 - 01/09 0700 In: 6321.9 [I.V.:5771.9; IV Piggyback:550] Out: 847 [Urine:597; Emesis/NG output:250]  Intake/Output from this shift:    Physical Exam: General appearance: alert and no distress Neck: no adenopathy, no carotid bruit, no JVD, supple, symmetrical, trachea midline and thyroid not enlarged, symmetric, no tenderness/mass/nodules Lungs: clear to auscultation bilaterally Heart: regular rate and rhythm, S1, S2 normal, no murmur, click, rub or gallop Extremities: extremities normal, atraumatic, no cyanosis or edema  Lab Results: Results for orders placed or performed during the hospital encounter of 09/06/2015 (from the past 48 hour(s))  Comprehensive metabolic panel     Status: Abnormal   Collection Time: 09/01/2015  3:20 PM  Result Value Ref Range   Sodium 139 135 - 145 mmol/L   Potassium 4.4 3.5 - 5.1 mmol/L   Chloride 105 101 - 111 mmol/L   CO2 18 (L) 22 - 32 mmol/L   Glucose, Bld 172 (H) 65 - 99 mg/dL   BUN 20 6 - 20 mg/dL   Creatinine, Ser 1.19 0.61 - 1.24 mg/dL   Calcium 8.7 (L) 8.9 - 10.3 mg/dL   Total Protein 6.3 (L) 6.5 - 8.1 g/dL   Albumin 3.2 (L) 3.5 - 5.0 g/dL   AST 145 (H) 15 - 41 U/L   ALT 137 (H) 17 - 63 U/L   Alkaline Phosphatase 87 38 - 126 U/L   Total Bilirubin 0.8 0.3 - 1.2 mg/dL   GFR calc non Af Amer >60 >60 mL/min   GFR calc Af Amer >60 >60 mL/min    Comment: (NOTE) The eGFR has been calculated using  the CKD EPI equation. This calculation has not been validated in all clinical situations. eGFR's persistently <60 mL/min signify possible Chronic Kidney Disease.    Anion gap 16 (H) 5 - 15  CBC with Differential     Status: Abnormal   Collection Time: 09/23/2015  3:20 PM  Result Value Ref Range   WBC 21.3 (H) 4.0 - 10.5 K/uL   RBC 5.02 4.22 - 5.81 MIL/uL   Hemoglobin 13.0 13.0 - 17.0 g/dL   HCT 40.6 39.0 - 52.0 %   MCV 80.9 78.0 - 100.0 fL   MCH 25.9 (L) 26.0 - 34.0 pg   MCHC 32.0 30.0 - 36.0 g/dL   RDW 14.8 11.5 - 15.5 %   Platelets 411 (H) 150 - 400 K/uL   Neutrophils Relative % 83 %   Neutro Abs 17.9 (H) 1.7 - 7.7 K/uL   Lymphocytes Relative 10 %   Lymphs Abs 2.0 0.7 - 4.0 K/uL   Monocytes Relative 5 %   Monocytes Absolute 1.0 0.1 - 1.0 K/uL   Eosinophils Relative 2 %   Eosinophils Absolute 0.4 0.0 - 0.7 K/uL   Basophils Relative 0 %   Basophils Absolute 0.1 0.0 - 0.1 K/uL  I-stat troponin, ED     Status: Abnormal   Collection  Time: 09/14/2015  3:20 PM  Result Value Ref Range   Troponin i, poc 0.14 (HH) 0.00 - 0.08 ng/mL   Comment NOTIFIED PHYSICIAN    Comment 3            Comment: Due to the release kinetics of cTnI, a negative result within the first hours of the onset of symptoms does not rule out myocardial infarction with certainty. If myocardial infarction is still suspected, repeat the test at appropriate intervals.   Protime-INR     Status: Abnormal   Collection Time: 09/06/2015  3:20 PM  Result Value Ref Range   Prothrombin Time 17.1 (H) 11.6 - 15.2 seconds   INR 1.38 0.00 - 1.49  Brain natriuretic peptide     Status: Abnormal   Collection Time: 09/19/2015  3:20 PM  Result Value Ref Range   B Natriuretic Peptide 235.5 (H) 0.0 - 100.0 pg/mL  I-stat Chem 8, ED     Status: Abnormal   Collection Time: 09/12/2015  3:22 PM  Result Value Ref Range   Sodium 140 135 - 145 mmol/L   Potassium 4.3 3.5 - 5.1 mmol/L   Chloride 105 101 - 111 mmol/L   BUN 25 (H) 6 - 20 mg/dL    Creatinine, Ser 1.00 0.61 - 1.24 mg/dL   Glucose, Bld 171 (H) 65 - 99 mg/dL   Calcium, Ion 1.12 (L) 1.13 - 1.30 mmol/L   TCO2 20 0 - 100 mmol/L   Hemoglobin 14.6 13.0 - 17.0 g/dL   HCT 43.0 39.0 - 52.0 %  I-Stat CG4 Lactic Acid, ED     Status: Abnormal   Collection Time: 09/14/2015  3:23 PM  Result Value Ref Range   Lactic Acid, Venous 7.63 (HH) 0.5 - 2.0 mmol/L   Comment NOTIFIED PHYSICIAN   I-Stat Arterial Blood Gas, ED - (order at [facility] and [facility] only)     Status: Abnormal   Collection Time: 09/27/2015  3:45 PM  Result Value Ref Range   pH, Arterial 7.235 (L) 7.350 - 7.450   pCO2 arterial 44.9 35.0 - 45.0 mmHg   pO2, Arterial 192.0 (H) 80.0 - 100.0 mmHg   Bicarbonate 19.0 (L) 20.0 - 24.0 mEq/L   TCO2 20 0 - 100 mmol/L   O2 Saturation 99.0 %   Acid-base deficit 8.0 (H) 0.0 - 2.0 mmol/L   Patient temperature HIDE    Sample type ARTERIAL   Lactic acid, plasma     Status: Abnormal   Collection Time: 09/03/2015  4:51 PM  Result Value Ref Range   Lactic Acid, Venous 3.5 (HH) 0.5 - 2.0 mmol/L    Comment: CRITICAL RESULT CALLED TO, READ BACK BY AND VERIFIED WITH: PERKINS AT [facility] [place] ON 09/01/2015   Urine culture     Status: None (Preliminary result)   Collection Time: 09/09/2015  4:51 PM  Result Value Ref Range   Specimen Description URINE, CATHETERIZED    Special Requests Normal    Culture NO GROWTH < 24 HOURS    Report Status PENDING   Strep pneumoniae urinary antigen     Status: None   Collection Time: 09/17/2015  4:51 PM  Result Value Ref Range   Strep Pneumo Urinary Antigen NEGATIVE NEGATIVE    Comment:        Infection due to S. pneumoniae cannot be absolutely ruled out since the antigen present may be below the detection limit of the test.   POCT Activated clotting time     Status: None  Collection Time: 09/07/2015  5:50 PM  Result Value Ref Range   Activated Clotting Time 239 seconds  Magnesium     Status: None   Collection Time: 09/12/2015  6:45 PM  Result Value Ref  Range   Magnesium 1.8 1.7 - 2.4 mg/dL  Phosphorus     Status: Abnormal   Collection Time: 09/23/2015  6:45 PM  Result Value Ref Range   Phosphorus 5.9 (H) 2.5 - 4.6 mg/dL  Procalcitonin     Status: None   Collection Time: 09/10/2015  6:45 PM  Result Value Ref Range   Procalcitonin 0.29 ng/mL    Comment:        Interpretation: PCT (Procalcitonin) <= 0.5 ng/mL: Systemic infection (sepsis) is not likely. Local bacterial infection is possible. (NOTE)         ICU PCT Algorithm               Non ICU PCT Algorithm    ----------------------------     ------------------------------         PCT < 0.25 ng/mL                 PCT < 0.1 ng/mL     Stopping of antibiotics            Stopping of antibiotics       strongly encouraged.               strongly encouraged.    ----------------------------     ------------------------------       PCT level decrease by               PCT < 0.25 ng/mL       >= 80% from peak PCT       OR PCT 0.25 - 0.5 ng/mL          Stopping of antibiotics                                             encouraged.     Stopping of antibiotics           encouraged.    ----------------------------     ------------------------------       PCT level decrease by              PCT >= 0.25 ng/mL       < 80% from peak PCT        AND PCT >= 0.5 ng/mL            Continuin g antibiotics                                              encouraged.       Continuing antibiotics            encouraged.    ----------------------------     ------------------------------     PCT level increase compared          PCT > 0.5 ng/mL         with peak PCT AND          PCT >= 0.5 ng/mL             Escalation of antibiotics  strongly encouraged.      Escalation of antibiotics        strongly encouraged.   Cortisol     Status: None   Collection Time: 09/23/2015  6:45 PM  Result Value Ref Range   Cortisol, Plasma 11.8 ug/dL    Comment: (NOTE) AM    6.7 - 22.6  ug/dL PM   <10.0       ug/dL   CBC     Status: Abnormal   Collection Time: 09/11/2015  6:45 PM  Result Value Ref Range   WBC 17.9 (H) 4.0 - 10.5 K/uL   RBC 5.73 4.22 - 5.81 MIL/uL   Hemoglobin 14.6 13.0 - 17.0 g/dL   HCT 46.1 39.0 - 52.0 %   MCV 80.5 78.0 - 100.0 fL   MCH 25.5 (L) 26.0 - 34.0 pg   MCHC 31.7 30.0 - 36.0 g/dL   RDW 14.7 11.5 - 15.5 %   Platelets 471 (H) 150 - 400 K/uL  Protime-INR     Status: Abnormal   Collection Time: 09/16/2015  6:45 PM  Result Value Ref Range   Prothrombin Time 19.5 (H) 11.6 - 15.2 seconds   INR 1.65 (H) 0.00 - 1.49  APTT     Status: Abnormal   Collection Time: 09/09/2015  6:45 PM  Result Value Ref Range   aPTT 156 (H) 24 - 37 seconds    Comment:        IF BASELINE aPTT IS ELEVATED, SUGGEST PATIENT RISK ASSESSMENT BE USED TO DETERMINE APPROPRIATE ANTICOAGULANT THERAPY.   Troponin I     Status: Abnormal   Collection Time: 09/28/2015  6:45 PM  Result Value Ref Range   Troponin I 5.96 (HH) <0.031 ng/mL    Comment:        POSSIBLE MYOCARDIAL ISCHEMIA. SERIAL TESTING RECOMMENDED. CRITICAL RESULT CALLED TO, READ BACK BY AND VERIFIED WITH: OKEFFE, C AT [place] ON 09/08/2015   Culture, blood (routine x 2)     Status: None (Preliminary result)   Collection Time: 08/29/2015  6:55 PM  Result Value Ref Range   Specimen Description BLOOD LEFT HAND    Special Requests BOTTLES DRAWN AEROBIC ONLY 5CC    Culture NO GROWTH < 24 HOURS    Report Status PENDING   Glucose, capillary     Status: Abnormal   Collection Time: 09/18/2015  6:55 PM  Result Value Ref Range   Glucose-Capillary 147 (H) 65 - 99 mg/dL   Comment 1 Venous Specimen   I-STAT 3, arterial blood gas (G3+)     Status: Abnormal   Collection Time: 09/18/2015  7:02 PM  Result Value Ref Range   pH, Arterial 7.184 (LL) 7.350 - 7.450   pCO2 arterial 49.4 (H) 35.0 - 45.0 mmHg   pO2, Arterial 64.0 (L) 80.0 - 100.0 mmHg   Bicarbonate 18.6 (L) 20.0 - 24.0 mEq/L   TCO2 20 0 - 100 mmol/L   O2  Saturation 86.0 %   Acid-base deficit 10.0 (H) 0.0 - 2.0 mmol/L   Patient temperature HIDE    Sample type ARTERIAL    Comment NOTIFIED PHYSICIAN   Culture, blood (routine x 2)     Status: None (Preliminary result)   Collection Time: 09/18/2015  7:20 PM  Result Value Ref Range   Specimen Description BLOOD RIGHT HAND    Special Requests IN PEDIATRIC BOTTLE 4CC    Culture NO GROWTH < 24 HOURS    Report Status PENDING   Basic metabolic panel  Status: Abnormal   Collection Time: 09/09/2015  7:35 PM  Result Value Ref Range   Sodium 137 135 - 145 mmol/L   Potassium 3.5 3.5 - 5.1 mmol/L    Comment: DELTA CHECK NOTED   Chloride 107 101 - 111 mmol/L   CO2 17 (L) 22 - 32 mmol/L   Glucose, Bld 218 (H) 65 - 99 mg/dL   BUN 19 6 - 20 mg/dL   Creatinine, Ser 0.88 0.61 - 1.24 mg/dL   Calcium 7.9 (L) 8.9 - 10.3 mg/dL   GFR calc non Af Amer >60 >60 mL/min   GFR calc Af Amer >60 >60 mL/min    Comment: (NOTE) The eGFR has been calculated using the CKD EPI equation. This calculation has not been validated in all clinical situations. eGFR's persistently <60 mL/min signify possible Chronic Kidney Disease.    Anion gap 13 5 - 15  I-STAT 3, arterial blood gas (G3+)     Status: Abnormal   Collection Time: 08/29/2015  7:48 PM  Result Value Ref Range   pH, Arterial 7.300 (L) 7.350 - 7.450   pCO2 arterial 31.7 (L) 35.0 - 45.0 mmHg   pO2, Arterial 57.0 (L) 80.0 - 100.0 mmHg   Bicarbonate 16.0 (L) 20.0 - 24.0 mEq/L   TCO2 17 0 - 100 mmol/L   O2 Saturation 90.0 %   Acid-base deficit 10.0 (H) 0.0 - 2.0 mmol/L   Patient temperature 94.6 F    Sample type ARTERIAL   Glucose, capillary     Status: Abnormal   Collection Time: 09/20/2015  8:04 PM  Result Value Ref Range   Glucose-Capillary 140 (H) 65 - 99 mg/dL  Troponin I     Status: Abnormal   Collection Time: 09/03/2015  9:33 PM  Result Value Ref Range   Troponin I 16.91 (HH) <0.031 ng/mL    Comment:        POSSIBLE MYOCARDIAL ISCHEMIA. SERIAL  TESTING RECOMMENDED. CRITICAL VALUE NOTED.  VALUE IS CONSISTENT WITH PREVIOUSLY REPORTED AND CALLED VALUE.   Glucose, capillary     Status: Abnormal   Collection Time: 09/02/2015  9:47 PM  Result Value Ref Range   Glucose-Capillary 169 (H) 65 - 99 mg/dL   Comment 1 Capillary Specimen    Comment 2 Notify RN   I-STAT 3, arterial blood gas (G3+)     Status: Abnormal   Collection Time: 09/11/2015 10:05 PM  Result Value Ref Range   pH, Arterial 7.361 7.350 - 7.450   pCO2 arterial 25.2 (L) 35.0 - 45.0 mmHg   pO2, Arterial 76.0 (L) 80.0 - 100.0 mmHg   Bicarbonate 14.9 (L) 20.0 - 24.0 mEq/L   TCO2 16 0 - 100 mmol/L   O2 Saturation 97.0 %   Acid-base deficit 10.0 (H) 0.0 - 2.0 mmol/L   Patient temperature 91.4 F    Sample type ARTERIAL   Basic metabolic panel     Status: Abnormal   Collection Time: 08/30/2015 10:36 PM  Result Value Ref Range   Sodium 137 135 - 145 mmol/L   Potassium 3.4 (L) 3.5 - 5.1 mmol/L   Chloride 104 101 - 111 mmol/L   CO2 22 22 - 32 mmol/L   Glucose, Bld 311 (H) 65 - 99 mg/dL   BUN 20 6 - 20 mg/dL   Creatinine, Ser 1.09 0.61 - 1.24 mg/dL   Calcium 7.4 (L) 8.9 - 10.3 mg/dL   GFR calc non Af Amer >60 >60 mL/min   GFR calc Af Amer >60 >60  mL/min    Comment: (NOTE) The eGFR has been calculated using the CKD EPI equation. This calculation has not been validated in all clinical situations. eGFR's persistently <60 mL/min signify possible Chronic Kidney Disease.    Anion gap 11 5 - 15  Glucose, capillary     Status: Abnormal   Collection Time: 09/20/2015 11:19 PM  Result Value Ref Range   Glucose-Capillary 233 (H) 65 - 99 mg/dL   Comment 1 Capillary Specimen    Comment 2 Notify RN   Lactic acid, plasma     Status: Abnormal   Collection Time: 09/27/2015 11:41 PM  Result Value Ref Range   Lactic Acid, Venous 4.8 (HH) 0.5 - 2.0 mmol/L    Comment: CRITICAL RESULT CALLED TO, READ BACK BY AND VERIFIED WITH: OKEEFFE C,RN 09/05/15 0025 WAYK   Troponin I     Status: Abnormal    Collection Time: 09/10/2015 11:44 PM  Result Value Ref Range   Troponin I 24.24 (HH) <0.031 ng/mL    Comment:        POSSIBLE MYOCARDIAL ISCHEMIA. SERIAL TESTING RECOMMENDED. CRITICAL VALUE NOTED.  VALUE IS CONSISTENT WITH PREVIOUSLY REPORTED AND CALLED VALUE.   Basic metabolic panel     Status: Abnormal   Collection Time: 09/03/2015 11:44 PM  Result Value Ref Range   Sodium 136 135 - 145 mmol/L   Potassium 4.3 3.5 - 5.1 mmol/L    Comment: DELTA CHECK NOTED   Chloride 106 101 - 111 mmol/L   CO2 16 (L) 22 - 32 mmol/L   Glucose, Bld 270 (H) 65 - 99 mg/dL   BUN 22 (H) 6 - 20 mg/dL   Creatinine, Ser 1.09 0.61 - 1.24 mg/dL   Calcium 7.7 (L) 8.9 - 10.3 mg/dL   GFR calc non Af Amer >60 >60 mL/min   GFR calc Af Amer >60 >60 mL/min    Comment: (NOTE) The eGFR has been calculated using the CKD EPI equation. This calculation has not been validated in all clinical situations. eGFR's persistently <60 mL/min signify possible Chronic Kidney Disease.    Anion gap 14 5 - 15  MRSA PCR Screening     Status: None   Collection Time: 09/06/2015 11:55 PM  Result Value Ref Range   MRSA by PCR NEGATIVE NEGATIVE    Comment:        The GeneXpert MRSA Assay (FDA approved for NASAL specimens only), is one component of a comprehensive MRSA colonization surveillance program. It is not intended to diagnose MRSA infection nor to guide or monitor treatment for MRSA infections.   Glucose, capillary     Status: Abnormal   Collection Time: 09/05/15 12:17 AM  Result Value Ref Range   Glucose-Capillary 234 (H) 65 - 99 mg/dL   Comment 1 Capillary Specimen    Comment 2 Notify RN   Protime-INR now and repeat in 8 hours     Status: Abnormal   Collection Time: 09/05/15 12:20 AM  Result Value Ref Range   Prothrombin Time 18.1 (H) 11.6 - 15.2 seconds   INR 1.49 0.00 - 1.49  APTT now and repeat in 8 hours     Status: None   Collection Time: 09/05/15 12:20 AM  Result Value Ref Range   aPTT 34 24 - 37 seconds   Basic metabolic panel     Status: Abnormal   Collection Time: 09/05/15  1:20 AM  Result Value Ref Range   Sodium 135 135 - 145 mmol/L   Potassium 3.5 3.5 -  5.1 mmol/L    Comment: DELTA CHECK NOTED   Chloride 105 101 - 111 mmol/L   CO2 16 (L) 22 - 32 mmol/L   Glucose, Bld 238 (H) 65 - 99 mg/dL   BUN 23 (H) 6 - 20 mg/dL   Creatinine, Ser 1.17 0.61 - 1.24 mg/dL   Calcium 7.7 (L) 8.9 - 10.3 mg/dL   GFR calc non Af Amer >60 >60 mL/min   GFR calc Af Amer >60 >60 mL/min    Comment: (NOTE) The eGFR has been calculated using the CKD EPI equation. This calculation has not been validated in all clinical situations. eGFR's persistently <60 mL/min signify possible Chronic Kidney Disease.    Anion gap 14 5 - 15  Glucose, capillary     Status: Abnormal   Collection Time: 09/05/15  1:21 AM  Result Value Ref Range   Glucose-Capillary 219 (H) 65 - 99 mg/dL   Comment 1 Capillary Specimen    Comment 2 Notify RN   Glucose, capillary     Status: Abnormal   Collection Time: 09/05/15  2:26 AM  Result Value Ref Range   Glucose-Capillary 194 (H) 65 - 99 mg/dL   Comment 1 Capillary Specimen    Comment 2 Notify RN   Glucose, capillary     Status: Abnormal   Collection Time: 09/05/15  4:07 AM  Result Value Ref Range   Glucose-Capillary 189 (H) 65 - 99 mg/dL   Comment 1 Capillary Specimen    Comment 2 Notify RN   CBC     Status: Abnormal   Collection Time: 09/05/15  4:24 AM  Result Value Ref Range   WBC 10.5 4.0 - 10.5 K/uL   RBC 5.40 4.22 - 5.81 MIL/uL   Hemoglobin 13.5 13.0 - 17.0 g/dL   HCT 42.7 39.0 - 52.0 %   MCV 79.1 78.0 - 100.0 fL   MCH 25.0 (L) 26.0 - 34.0 pg   MCHC 31.6 30.0 - 36.0 g/dL   RDW 14.8 11.5 - 15.5 %   Platelets 372 150 - 400 K/uL  Basic metabolic panel     Status: Abnormal   Collection Time: 09/05/15  4:24 AM  Result Value Ref Range   Sodium 137 135 - 145 mmol/L   Potassium 2.8 (L) 3.5 - 5.1 mmol/L    Comment: DELTA CHECK NOTED   Chloride 107 101 - 111 mmol/L   CO2  18 (L) 22 - 32 mmol/L   Glucose, Bld 211 (H) 65 - 99 mg/dL   BUN 23 (H) 6 - 20 mg/dL   Creatinine, Ser 1.37 (H) 0.61 - 1.24 mg/dL   Calcium 7.7 (L) 8.9 - 10.3 mg/dL   GFR calc non Af Amer 51 (L) >60 mL/min   GFR calc Af Amer 59 (L) >60 mL/min    Comment: (NOTE) The eGFR has been calculated using the CKD EPI equation. This calculation has not been validated in all clinical situations. eGFR's persistently <60 mL/min signify possible Chronic Kidney Disease.    Anion gap 12 5 - 15  Troponin I     Status: Abnormal   Collection Time: 09/05/15  4:24 AM  Result Value Ref Range   Troponin I 60.18 (HH) <0.031 ng/mL    Comment:        POSSIBLE MYOCARDIAL ISCHEMIA. SERIAL TESTING RECOMMENDED. CRITICAL VALUE NOTED.  VALUE IS CONSISTENT WITH PREVIOUSLY REPORTED AND CALLED VALUE.   APTT     Status: Abnormal   Collection Time: 09/05/15  4:24 AM  Result  Value Ref Range   aPTT 44 (H) 24 - 37 seconds    Comment:        IF BASELINE aPTT IS ELEVATED, SUGGEST PATIENT RISK ASSESSMENT BE USED TO DETERMINE APPROPRIATE ANTICOAGULANT THERAPY.   Protime-INR     Status: Abnormal   Collection Time: 09/05/15  4:24 AM  Result Value Ref Range   Prothrombin Time 18.2 (H) 11.6 - 15.2 seconds   INR 1.50 (H) 0.00 - 1.49  Magnesium     Status: Abnormal   Collection Time: 09/05/15  4:24 AM  Result Value Ref Range   Magnesium 2.5 (H) 1.7 - 2.4 mg/dL  Differential     Status: Abnormal   Collection Time: 09/05/15  4:24 AM  Result Value Ref Range   Neutrophils Relative % 87 %   Neutro Abs 9.0 (H) 1.7 - 7.7 K/uL   Lymphocytes Relative 9 %   Lymphs Abs 1.0 0.7 - 4.0 K/uL   Monocytes Relative 4 %   Monocytes Absolute 0.5 0.1 - 1.0 K/uL   Eosinophils Relative 0 %   Eosinophils Absolute 0.0 0.0 - 0.7 K/uL   Basophils Relative 0 %   Basophils Absolute 0.0 0.0 - 0.1 K/uL  Glucose, capillary     Status: Abnormal   Collection Time: 09/05/15  5:13 AM  Result Value Ref Range   Glucose-Capillary 181 (H) 65 - 99  mg/dL   Comment 1 Capillary Specimen    Comment 2 Notify RN   Troponin I     Status: Abnormal   Collection Time: 09/05/15  6:20 AM  Result Value Ref Range   Troponin I 64.90 (HH) <0.031 ng/mL    Comment:        POSSIBLE MYOCARDIAL ISCHEMIA. SERIAL TESTING RECOMMENDED. CRITICAL VALUE NOTED.  VALUE IS CONSISTENT WITH PREVIOUSLY REPORTED AND CALLED VALUE.   Glucose, capillary     Status: Abnormal   Collection Time: 09/05/15  6:20 AM  Result Value Ref Range   Glucose-Capillary 174 (H) 65 - 99 mg/dL  I-STAT 3, arterial blood gas (G3+)     Status: Abnormal   Collection Time: 09/05/15  6:38 AM  Result Value Ref Range   pH, Arterial 7.341 (L) 7.350 - 7.450   pCO2 arterial 27.8 (L) 35.0 - 45.0 mmHg   pO2, Arterial 73.0 (L) 80.0 - 100.0 mmHg   Bicarbonate 16.1 (L) 20.0 - 24.0 mEq/L   TCO2 17 0 - 100 mmol/L   O2 Saturation 97.0 %   Acid-base deficit 10.0 (H) 0.0 - 2.0 mmol/L   Patient temperature 31.3 C    Sample type ARTERIAL   Glucose, capillary     Status: Abnormal   Collection Time: 09/05/15  7:01 AM  Result Value Ref Range   Glucose-Capillary 173 (H) 65 - 99 mg/dL   Comment 1 Capillary Specimen    Comment 2 Notify RN   Glucose, capillary     Status: Abnormal   Collection Time: 09/05/15  9:01 AM  Result Value Ref Range   Glucose-Capillary 141 (H) 65 - 99 mg/dL  I-STAT, chem 8     Status: Abnormal   Collection Time: 09/05/15  9:08 AM  Result Value Ref Range   Sodium 135 135 - 145 mmol/L   Potassium 3.3 (L) 3.5 - 5.1 mmol/L   Chloride 111 101 - 111 mmol/L   BUN 23 (H) 6 - 20 mg/dL   Creatinine, Ser 1.20 0.61 - 1.24 mg/dL   Glucose, Bld 149 (H) 65 - 99 mg/dL  Calcium, Ion 1.03 (L) 1.13 - 1.30 mmol/L   TCO2 18 0 - 100 mmol/L   Hemoglobin 14.3 13.0 - 17.0 g/dL   HCT 24.7 08.9 - 15.6 %  Glucose, capillary     Status: Abnormal   Collection Time: 09/05/15 10:14 AM  Result Value Ref Range   Glucose-Capillary 119 (H) 65 - 99 mg/dL  Troponin I     Status: Abnormal   Collection  Time: 09/05/15 11:00 AM  Result Value Ref Range   Troponin I >65.00 (HH) <0.031 ng/mL    Comment:        POSSIBLE MYOCARDIAL ISCHEMIA. SERIAL TESTING RECOMMENDED. CRITICAL VALUE NOTED.  VALUE IS CONSISTENT WITH PREVIOUSLY REPORTED AND CALLED VALUE.   Heparin level (unfractionated)     Status: Abnormal   Collection Time: 09/05/15 11:00 AM  Result Value Ref Range   Heparin Unfractionated 0.28 (L) 0.30 - 0.70 IU/mL    Comment:        IF HEPARIN RESULTS ARE BELOW EXPECTED VALUES, AND PATIENT DOSAGE HAS BEEN CONFIRMED, SUGGEST FOLLOW UP TESTING OF ANTITHROMBIN III LEVELS.   Glucose, capillary     Status: Abnormal   Collection Time: 09/05/15 11:22 AM  Result Value Ref Range   Glucose-Capillary 124 (H) 65 - 99 mg/dL   Comment 1 Arterial Specimen    Comment 2 Notify RN   Glucose, capillary     Status: Abnormal   Collection Time: 09/05/15 12:35 PM  Result Value Ref Range   Glucose-Capillary 129 (H) 65 - 99 mg/dL  I-STAT, chem 8     Status: Abnormal   Collection Time: 09/05/15 12:40 PM  Result Value Ref Range   Sodium 139 135 - 145 mmol/L   Potassium 3.1 (L) 3.5 - 5.1 mmol/L   Chloride 103 101 - 111 mmol/L   BUN 27 (H) 6 - 20 mg/dL   Creatinine, Ser 4.97 0.61 - 1.24 mg/dL   Glucose, Bld 393 (H) 65 - 99 mg/dL   Calcium, Ion 1.85 (L) 1.13 - 1.30 mmol/L   TCO2 19 0 - 100 mmol/L   Hemoglobin 14.3 13.0 - 17.0 g/dL   HCT 17.9 14.1 - 85.4 %  Glucose, capillary     Status: Abnormal   Collection Time: 09/05/15  4:12 PM  Result Value Ref Range   Glucose-Capillary 160 (H) 65 - 99 mg/dL   Comment 1 Arterial Specimen    Comment 2 Notify RN   Heparin level (unfractionated)     Status: Abnormal   Collection Time: 09/05/15  4:45 PM  Result Value Ref Range   Heparin Unfractionated 0.24 (L) 0.30 - 0.70 IU/mL    Comment:        IF HEPARIN RESULTS ARE BELOW EXPECTED VALUES, AND PATIENT DOSAGE HAS BEEN CONFIRMED, SUGGEST FOLLOW UP TESTING OF ANTITHROMBIN III LEVELS.   I-STAT, chem 8      Status: Abnormal   Collection Time: 09/05/15  5:15 PM  Result Value Ref Range   Sodium 137 135 - 145 mmol/L   Potassium 4.0 3.5 - 5.1 mmol/L   Chloride 102 101 - 111 mmol/L   BUN 28 (H) 6 - 20 mg/dL   Creatinine, Ser 9.74 0.61 - 1.24 mg/dL   Glucose, Bld 681 (H) 65 - 99 mg/dL   Calcium, Ion 0.39 (L) 1.13 - 1.30 mmol/L   TCO2 19 0 - 100 mmol/L   Hemoglobin 16.3 13.0 - 17.0 g/dL   HCT 02.7 18.2 - 63.8 %  Glucose, capillary     Status:  Abnormal   Collection Time: 09/05/15  8:20 PM  Result Value Ref Range   Glucose-Capillary 145 (H) 65 - 99 mg/dL   Comment 1 Arterial Specimen   Basic metabolic panel     Status: Abnormal   Collection Time: 09/05/15  9:25 PM  Result Value Ref Range   Sodium 136 135 - 145 mmol/L   Potassium 3.5 3.5 - 5.1 mmol/L   Chloride 102 101 - 111 mmol/L   CO2 21 (L) 22 - 32 mmol/L   Glucose, Bld 160 (H) 65 - 99 mg/dL   BUN 27 (H) 6 - 20 mg/dL   Creatinine, Ser 1.20 0.61 - 1.24 mg/dL   Calcium 7.3 (L) 8.9 - 10.3 mg/dL   GFR calc non Af Amer 60 (L) >60 mL/min   GFR calc Af Amer >60 >60 mL/min    Comment: (NOTE) The eGFR has been calculated using the CKD EPI equation. This calculation has not been validated in all clinical situations. eGFR's persistently <60 mL/min signify possible Chronic Kidney Disease.    Anion gap 13 5 - 15  Glucose, capillary     Status: Abnormal   Collection Time: 09/05/15  9:25 PM  Result Value Ref Range   Glucose-Capillary 137 (H) 65 - 99 mg/dL   Comment 1 Arterial Specimen   Glucose, capillary     Status: Abnormal   Collection Time: 09/05/15 10:36 PM  Result Value Ref Range   Glucose-Capillary 150 (H) 65 - 99 mg/dL   Comment 1 Arterial Specimen   Glucose, capillary     Status: Abnormal   Collection Time: 09/05/15 11:28 PM  Result Value Ref Range   Glucose-Capillary 148 (H) 65 - 99 mg/dL   Comment 1 Capillary Specimen   Basic metabolic panel     Status: Abnormal   Collection Time: 09/06/15  1:27 AM  Result Value Ref Range    Sodium 137 135 - 145 mmol/L   Potassium 3.4 (L) 3.5 - 5.1 mmol/L   Chloride 100 (L) 101 - 111 mmol/L   CO2 21 (L) 22 - 32 mmol/L   Glucose, Bld 163 (H) 65 - 99 mg/dL   BUN 27 (H) 6 - 20 mg/dL   Creatinine, Ser 1.16 0.61 - 1.24 mg/dL   Calcium 7.1 (L) 8.9 - 10.3 mg/dL   GFR calc non Af Amer >60 >60 mL/min   GFR calc Af Amer >60 >60 mL/min    Comment: (NOTE) The eGFR has been calculated using the CKD EPI equation. This calculation has not been validated in all clinical situations. eGFR's persistently <60 mL/min signify possible Chronic Kidney Disease.    Anion gap 16 (H) 5 - 15  Glucose, capillary     Status: Abnormal   Collection Time: 09/06/15  4:22 AM  Result Value Ref Range   Glucose-Capillary 136 (H) 65 - 99 mg/dL   Comment 1 Arterial Specimen   Basic metabolic panel     Status: Abnormal   Collection Time: 09/06/15  5:25 AM  Result Value Ref Range   Sodium 136 135 - 145 mmol/L   Potassium 3.3 (L) 3.5 - 5.1 mmol/L   Chloride 100 (L) 101 - 111 mmol/L   CO2 23 22 - 32 mmol/L   Glucose, Bld 143 (H) 65 - 99 mg/dL   BUN 26 (H) 6 - 20 mg/dL   Creatinine, Ser 1.18 0.61 - 1.24 mg/dL   Calcium 6.9 (L) 8.9 - 10.3 mg/dL   GFR calc non Af Amer >60 >60 mL/min  GFR calc Af Amer >60 >60 mL/min    Comment: (NOTE) The eGFR has been calculated using the CKD EPI equation. This calculation has not been validated in all clinical situations. eGFR's persistently <60 mL/min signify possible Chronic Kidney Disease.    Anion gap 13 5 - 15  CBC     Status: Abnormal   Collection Time: 09/06/15  5:25 AM  Result Value Ref Range   WBC 12.1 (H) 4.0 - 10.5 K/uL    Comment: REPEATED TO VERIFY   RBC 4.65 4.22 - 5.81 MIL/uL   Hemoglobin 11.6 (L) 13.0 - 17.0 g/dL    Comment: SPECIMEN CHECKED FOR CLOTS REPEATED TO VERIFY    HCT 35.3 (L) 39.0 - 52.0 %   MCV 75.9 (L) 78.0 - 100.0 fL   MCH 24.9 (L) 26.0 - 34.0 pg   MCHC 32.9 30.0 - 36.0 g/dL   RDW 14.7 11.5 - 15.5 %   Platelets 261 150 - 400 K/uL      Comment: REPEATED TO VERIFY  Magnesium     Status: Abnormal   Collection Time: 09/06/15  5:25 AM  Result Value Ref Range   Magnesium 1.6 (L) 1.7 - 2.4 mg/dL  Phosphorus     Status: None   Collection Time: 09/06/15  5:25 AM  Result Value Ref Range   Phosphorus 3.4 2.5 - 4.6 mg/dL    Imaging: Imaging results have been reviewed. Diffuse ASD c/w Pulm edema/ ARDS or Asp PNA. I have personally reviewed  Tele- NSR  Assessment/Plan:   1. Principal Problem: 2.   Cardiac arrest while shoveling snow 3. Active Problems: 4.   HLD (hyperlipidemia) 5.   Essential hypertension 6.   H/O CABG x 4 2008 7.   VF i shocked twice in field 8.   PAF- NSR at LOV- now in AF with RVR post arrest 9.   LBBB (left bundle branch block) 10.   Elevated troponin 11.   Lactic acid acidosis 12.   Time Spent Directly with Patient:    30 minutes  Length of Stay:  LOS: 2 days    POD # 2 SCD/ CPR/ Defib/ Cath/ Artic Sun. Pt had 15 min of CPR. ISCM with EF 25%. Native and graft disease w/o culprit lesion. Had VF 2X in lab currently on IV Amio maintaining NSR. Hemodynamically stable on Vasopression and Levo. CVP 10-12. I/O ++ 7L. CXR shows diffuse infiltrates c/w Pulm edema/ ARDS or Asp PNA. Pt on rewarming protocol. Outlook guarded depending on Neuro recovery. Vent/ Sedation/ paralytics and Artic Sun protocol per PCCM. Will follow with you. K--->> replete. May require diuretics.   Quay Burow 09/06/2015, 7:48 AM

## 2015-09-06 NOTE — Progress Notes (Signed)
Pharmacy Antibiotic Follow-up Note  Patrick Sexton [facility] is a 70 y.o. year-old male admitted on 09/15/2015 post-cardiac arrest.  The patient is currently on day 1 of vancomycin and Zosyn for possible aspiration pneumonia.  Assessment/Plan: After discussion with Dr. Titus Mould, Unasyn will be changed to vancomycin + Zosyn based on persistent diffuse patchy airspace process shown on chest xray and continued high pressor needs.   - Vancomycin 1500 mg IV x 1, then 750 mg IV q12h given low UOP (< 1 L in 24 hr) and anticipation of SCr rise  - Zosyn 3.375 g q8h (1st dose given over 30 min)  Temp (24hrs), Avg:94.6 F (34.8 C), Min:89.6 F (32 C), Max:99.5 F (37.5 C)   Recent Labs Lab 09/27/2015 1520 09/03/2015 1845 09/05/15 0424 09/06/15 0525  WBC 21.3* 17.9* 10.5 12.1*    Recent Labs Lab 09/05/15 1240 09/05/15 1715 09/05/15 2125 09/06/15 0127 09/06/15 0525  CREATININE 1.20 1.00 1.20 1.16 1.18   Estimated Creatinine Clearance: 64.4 mL/min (by C-G formula based on Cr of 1.18).    No Known Allergies  Antimicrobials this admission: Unasyn 1/8 >> 1/9 (3 doses) Vancomycin 1/9 >>  Zosyn 1/9 >>   Levels/dose changes this admission: none  Microbiology results: 1/7 BCx x2: NG x2D  1/7 UCx: NG MRSA PCR negative   Thank you for allowing pharmacy to be a part of this patient's care.  Governor Specking, PharmD Clinical Pharmacy Resident Pager: [phone] 09/06/2015 3:10 PM

## 2015-09-06 NOTE — Progress Notes (Signed)
PULMONARY / CRITICAL CARE MEDICINE   Name: Patrick Sexton [facility] Sexton MRN: [id number] DOB: 1945/09/24    ADMISSION DATE:  08/30/2015   REFERRING Patrick:  EDP  CHIEF COMPLAINT:  Cardiac arrest   HISTORY OF PRESENT ILLNESS:   70 yo with hx CAD who was shoveling snow and went down. 15 min Vfib arrest. Cath no culprit.  SUBJECTIVE:  Sedated, warming  Poor UOP (597 ml yesterday)  Continues on levo, vasopressin, amio, bicarb gtt, fentanyl, versed, heparin gtt, and NS.    VITAL SIGNS: BP 127/88 mmHg  Pulse 75  Temp(Src) 96.3 F (35.7 C) (Core (Comment))  Resp 30  Ht 5\' 9"  (1.753 m)  Wt 86.58 kg (190 lb 14 oz)  BMI 28.17 kg/m2  SpO2 100%  HEMODYNAMICS: CVP:  [8 mmHg-15 mmHg] 14 mmHg  VENTILATOR SETTINGS: Vent Mode:  [-] PRVC FiO2 (%):  [50 %-90 %] 50 % Set Rate:  [30 bmp] 30 bmp Vt Set:  [550 mL] 550 mL PEEP:  [8 cmH20-10 cmH20] 8 cmH20 Plateau Pressure:  [25 cmH20-33 cmH20] 29 cmH20  INTAKE / OUTPUT: I/O last 3 completed shifts: In: 8773.2 [I.V.:7883.2; NG/GT:120; IV Piggyback:770] Out: 1067 [Urine:817; Emesis/NG output:250]  PHYSICAL EXAMINATION: General: acutely ill , sedated, on vent, paralytic remains clinically HEENT:PERL Cardiovascular:  RRR s1 s2 Lungs: CTA Abdomen:  Soft + bs, no r/g Musculoskeletal: intact Skin: cool  LABS:  BMET  Recent Labs Lab 09/05/15 2125 09/06/15 0127 09/06/15 0525  NA 136 137 136  K 3.5 3.4* 3.3*  CL 102 100* 100*  CO2 21* 21* 23  BUN 27* 27* 26*  CREATININE 1.20 1.16 1.18  GLUCOSE 160* 163* 143*    Electrolytes  Recent Labs Lab 09/05/2015 1845  09/05/15 0424 09/05/15 2125 09/06/15 0127 09/06/15 0525  CALCIUM  --   < > 7.7* 7.3* 7.1* 6.9*  MG 1.8  --  2.5*  --   --  1.6*  PHOS 5.9*  --   --   --   --  3.4  < > = values in this interval not displayed.  CBC  Recent Labs Lab 09/20/2015 1845 09/05/15 0424  09/05/15 1240 09/05/15 1715 09/06/15 0525  WBC 17.9* 10.5  --   --   --  12.1*  HGB 14.6 13.5  < > 14.3 16.3 11.6*   HCT 46.1 42.7  < > 42.0 48.0 35.3*  PLT 471* 372  --   --   --  261  < > = values in this interval not displayed.  Coag's  Recent Labs Lab 09/19/2015 1845 09/05/15 0020 09/05/15 0424  APTT 156* 34 44*  INR 1.65* 1.49 1.50*    Sepsis Markers  Recent Labs Lab 09/07/2015 1523 09/07/2015 1651 08/29/2015 1845 09/23/2015 2341  LATICACIDVEN 7.63* 3.5*  --  4.8*  PROCALCITON  --   --  0.29  --     ABG  Recent Labs Lab 09/13/2015 1948 09/15/2015 2205 09/05/15 0638  PHART 7.300* 7.361 7.341*  PCO2ART 31.7* 25.2* 27.8*  PO2ART 57.0* 76.0* 73.0*    Liver Enzymes  Recent Labs Lab 08/29/2015 1520  AST 145*  ALT 137*  ALKPHOS 87  BILITOT 0.8  ALBUMIN 3.2*    Cardiac Enzymes  Recent Labs Lab 09/05/15 0424 09/05/15 0620 09/05/15 1100  TROPONINI 60.18* 64.90* >65.00*    Glucose  Recent Labs Lab 09/05/15 2020 09/05/15 2125 09/05/15 2236 09/05/15 2328 09/06/15 0422 09/06/15 0800  GLUCAP 145* 137* 150* 148* 136* 121*    Imaging Dg Chest [facility]  1 View  09/06/2015  CLINICAL DATA:  Acute respiratory failure. EXAM: PORTABLE CHEST 1 VIEW COMPARISON:  09/05/2015. FINDINGS: The support apparatus is stable. The endotracheal tube is 2.8 cm above the carina. Persistent diffuse patchy asymmetric airspace process which could be asymmetric edema or pulmonary infiltrates. No pleural effusions. External pacer paddles appear stable. IMPRESSION: Stable support apparatus. Persistent bilateral airspace process. Electronically Signed   By: Marijo Sanes M.D.   On: 09/06/2015 07:43     STUDIES:  CT head 1/7>> mild atrophy LHC 1/7>> no intervention but severe LV dysfunction and severe native and graft disease CXR 1/9>>  CULTURES: 1/7 MRSA>>NEG 1/7 Urine Cx>> 1/7 Blood Cx>> 1/7 Tracheal Cx>>  ANTIBIOTICS: none  SIGNIFICANT EVENTS: 1/7cardiac arrest 1/8- shock 1/9- rewarm  LINES/TUBES: 1/7 et>> 1/7 left IJ>>> 1/7 rt rad a line>>>  DISCUSSION: 74  yom with cad post  arrest  ASSESSMENT / PLAN:  PULMONARY A: VDRF post arrest Suspected aspiration  P:   Vent bundle abg reviewed, was during hypothermia Repeat abg, ensure need rate reduction now? Repeat pcxr Goal to peep 5 if able  CARDIOVASCULAR A:  Post cardiac arrest while shoveling snow. 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 without clear culprit for infarct h/o CABG  CHF Shock (cardiogenic, r/o septic / sirs) Rel AI P:  Per Cards Asa Heparin drip, consider 48 hrs Levophed to map goal change 65 with rewarm Vaso consider dc Consider svo2 assessment give stress steroid empiric cvp 11, kvo Ensure tsh May bolus with rewarm  RENAL A:   AKI, oliguric Metab acidosis Hypokalemia, hypomag (rewarm though) P:   Bicarb gtt no role - dc  Replete lytes, k , mag, low amounts kvo  GASTROINTESTINAL A:  R/o shock liver GI protection P:   PPI lft Start TF  HEMATOLOGIC A:   Anticoagulation per cardiology  P:  PTT for heparin Cbc in am   INFECTIOUS A:   Possible aspiration P:   1/7 unasyn>>> Follow sputum further  ENDOCRINE A:   Hyperglycemia P:   Insulin gtt to ssi Get tsh  NEUROLOGIC A:   Currently sedated post intubation following cardiac arrest while shoveling snow. At risk anoxia P:   RASS goal: -5 Rewarm, dc paralysis, WUA today eeg mandatory on all arests  [name], [facility], PGY-3  09/06/2015, 8:22 AM   STAFF NOTE: I, Patrick Sexton, Patrick Sexton have personally reviewed patient's available data, including medical history, events of note, physical examination and test results as part of my evaluation. I have discussed with resident/NP and other care providers such as pharmacist, RN and RRT. In addition, I personally evaluated patient and elicited key findings of: residual paralysis, lungs ronchi, rewarm on going, remains in shock on high pressors, obtain abg now as on high MV and acidosis recovery noted, r/o  alkalotic, dc bicarb no role, echo needed at rewarm, dc vaso with known CHF, increase levophed if needed, lower MAP goal at end rewarm, add stress roids with cortisol less 20 in shock, get tsh, get svo2, supp low dose K with rewarm, cvp 11, kvo, may need bolus if drops BP at rewarm as svr drops, get echo, get eeg, dc paralysis and limit sedation to WUA, heparin to continue, with continued high presor needs and pcxr with aspiration likely , would broaden abx, ensure not missing organism  The patient is critically ill with multiple organ systems failure and requires high complexity decision making for assessment and support,  frequent evaluation and titration of therapies, application of advanced monitoring technologies and extensive interpretation of multiple databases.   Critical Care Time devoted to patient care services described in this note is 30 Minutes. This time reflects time of care of this signee: Patrick Sexton, Patrick Sexton. This critical care time does not reflect procedure time, or teaching time or supervisory time of PA/NP/Med student/Med Resident etc but could involve care discussion time. Rest per NP/medical resident whose note is outlined above and that I agree with   Patrick Sexton. Patrick Mould, Patrick, [place] Pgr: [facility] 09/06/2015 9:01 AM

## 2015-09-06 NOTE — Progress Notes (Signed)
25cc of versed wasted in the sink with [facility].

## 2015-09-06 NOTE — Progress Notes (Addendum)
ANTICOAGULATION CONSULT NOTE   Pharmacy Consult for Heparin  Indication: chest pain/ACS, hypothermia protocol, s/p cath  No Known Allergies  Patient Measurements: Height: 5\' 9"  (175.3 cm) Weight: 190 lb 14 oz (86.58 kg) (bed wt (89kg) - cooling pad wt (2.07kg + 0.35kg)) IBW/kg (Calculated) : 70.7  Vital Signs: Temp: 98.6 F (37 C) (01/09 1938) Temp Source: Core (Comment) (01/09 1938) BP: 111/62 mmHg (01/09 1938) Pulse Rate: 67 (01/09 1938)  Labs:  Recent Labs  09/20/2015 1845  09/05/15 0020  09/05/15 0424 09/05/15 0620  09/05/15 1100 09/05/15 1240  09/05/15 1715 09/05/15 2125 09/06/15 0127 09/06/15 0525 09/06/15 0940 09/06/15 1350 09/06/15 2135  HGB 14.6  --   --   --  13.5  --   < >  --  14.3  --  16.3  --   --  11.6*  --   --   --   HCT 46.1  --   --   --  42.7  --   < >  --  42.0  --  48.0  --   --  35.3*  --   --   --   PLT 471*  --   --   --  372  --   --   --   --   --   --   --   --  261  --   --   --   APTT 156*  --  34  --  44*  --   --   --   --   --   --   --   --   --   --   --   --   LABPROT 19.5*  --  18.1*  --  18.2*  --   --   --   --   --   --   --   --   --   --   --   --   INR 1.65*  --  1.49  --  1.50*  --   --   --   --   --   --   --   --   --   --   --   --   HEPARINUNFRC  --   --   --   --   --   --   --  0.28*  --   < >  --   --   --   --  0.10* 0.13* 0.16*  CREATININE  --   < >  --   < > 1.37*  --   < >  --  1.20  --  1.00 1.20 1.16 1.18  --   --   --   TROPONINI 5.96*  < >  --   --  60.18* 64.90*  --  >65.00*  --   --   --   --   --   --   --   --   --   < > = values in this interval not displayed.  Estimated Creatinine Clearance: 64.4 mL/min (by C-G formula based on Cr of 1.18).   Medical History: Past Medical History  Diagnosis Date  . Congestive cardiac failure ([place])   . Arthritis   . Hypertension   . Hyperlipidemia   . MI (myocardial infarction) [facility])    Assessment: 70 y/o Male  with history of ASCAD with CABG 2008 at [facility]  and  ischemic CM with EF  45-50% by recent echo.  He was admitted on 09/19/2015 s/p cardiac arrest while shoveling snow. S/p cardiac cath on 1/7.  Heparin started 6 hours post-sheath removal (~0020). Rewarming from 33 degree hypothermia protocol completed at 1015 on 1/9.  Heparin level remains subtherapeutic at 0.16 on 1100 units/hr. No interruptions with heparin infusion and no bleeding noted per RN.  Hgb 14.6 > 11.6, Plt  471 > 261. Likely dilutional drop as pt received 6 L fluid yesterday w/ < 1 L output. No reports of bleeding.  Goal of Therapy:  Heparin level 0.3-0.7 units/mL Monitor platelets by anticoagulation protocol: Yes   Plan:  - Increase heparin drip to 1400 units/hr  - Recheck HL in 6 hr - Daily CBC/HL  - Monitor for bleeding   Nicole Cella, RPh Clinical Pharmacist Pager: [phone] 09/06/2015 10:17 PM

## 2015-09-07 ENCOUNTER — Inpatient Hospital Stay (HOSPITAL_COMMUNITY): Payer: Medicare Other

## 2015-09-07 DIAGNOSIS — J9600 Acute respiratory failure, unspecified whether with hypoxia or hypercapnia: Secondary | ICD-10-CM

## 2015-09-07 DIAGNOSIS — Z452 Encounter for adjustment and management of vascular access device: Secondary | ICD-10-CM | POA: Insufficient documentation

## 2015-09-07 DIAGNOSIS — J9601 Acute respiratory failure with hypoxia: Secondary | ICD-10-CM | POA: Insufficient documentation

## 2015-09-07 LAB — COMPREHENSIVE METABOLIC PANEL
ALBUMIN: 2.2 g/dL — AB (ref 3.5–5.0)
ALK PHOS: 71 U/L (ref 38–126)
ALT: 153 U/L — AB (ref 17–63)
ANION GAP: 12 (ref 5–15)
AST: 161 U/L — AB (ref 15–41)
BILIRUBIN TOTAL: 2.2 mg/dL — AB (ref 0.3–1.2)
BUN: 25 mg/dL — AB (ref 6–20)
CALCIUM: 6.8 mg/dL — AB (ref 8.9–10.3)
CO2: 24 mmol/L (ref 22–32)
CREATININE: 1.42 mg/dL — AB (ref 0.61–1.24)
Chloride: 99 mmol/L — ABNORMAL LOW (ref 101–111)
GFR calc Af Amer: 57 mL/min — ABNORMAL LOW (ref >60)
GFR calc non Af Amer: 49 mL/min — ABNORMAL LOW (ref >60)
GLUCOSE: 123 mg/dL — AB (ref 65–99)
Potassium: 4.3 mmol/L (ref 3.5–5.1)
SODIUM: 135 mmol/L (ref 135–145)
TOTAL PROTEIN: 5 g/dL — AB (ref 6.5–8.1)

## 2015-09-07 LAB — BLOOD GAS, ARTERIAL
Acid-base deficit: 0.2 mmol/L (ref 0.0–2.0)
Bicarbonate: 22.8 mEq/L (ref 20.0–24.0)
DRAWN BY: 398981
FIO2: 0.5
LHR: 30 {breaths}/min
MECHVT: 550 mL
O2 Saturation: 98.6 %
PCO2 ART: 30.2 mmHg — AB (ref 35.0–45.0)
PEEP/CPAP: 5 cmH2O
PO2 ART: 118 mmHg — AB (ref 80.0–100.0)
Patient temperature: 98.6
TCO2: 23.8 mmol/L (ref 0–100)
pH, Arterial: 7.491 — ABNORMAL HIGH (ref 7.350–7.450)

## 2015-09-07 LAB — GLUCOSE, CAPILLARY
GLUCOSE-CAPILLARY: 116 mg/dL — AB (ref 65–99)
GLUCOSE-CAPILLARY: 131 mg/dL — AB (ref 65–99)
Glucose-Capillary: 105 mg/dL — ABNORMAL HIGH (ref 65–99)
Glucose-Capillary: 130 mg/dL — ABNORMAL HIGH (ref 65–99)
Glucose-Capillary: 115 mg/dL — ABNORMAL HIGH (ref 65–99)

## 2015-09-07 LAB — CBC WITH DIFFERENTIAL/PLATELET
BASOS ABS: 0 10*3/uL (ref 0.0–0.1)
BASOS PCT: 0 %
EOS PCT: 0 %
Eosinophils Absolute: 0 10*3/uL (ref 0.0–0.7)
HCT: 32.6 % — ABNORMAL LOW (ref 39.0–52.0)
Hemoglobin: 10.5 g/dL — ABNORMAL LOW (ref 13.0–17.0)
Lymphocytes Relative: 3 %
Lymphs Abs: 0.6 10*3/uL — ABNORMAL LOW (ref 0.7–4.0)
MCH: 25 pg — ABNORMAL LOW (ref 26.0–34.0)
MCHC: 32.2 g/dL (ref 30.0–36.0)
MCV: 77.6 fL — ABNORMAL LOW (ref 78.0–100.0)
MONO ABS: 0.6 10*3/uL (ref 0.1–1.0)
Monocytes Relative: 3 %
NEUTROS ABS: 19.9 10*3/uL — AB (ref 1.7–7.7)
Neutrophils Relative %: 94 %
PLATELETS: 271 10*3/uL (ref 150–400)
RBC: 4.2 MIL/uL — AB (ref 4.22–5.81)
RDW: 15.7 % — AB (ref 11.5–15.5)
WBC: 21.1 10*3/uL — AB (ref 4.0–10.5)

## 2015-09-07 LAB — POCT I-STAT 3, ART BLOOD GAS (G3+)
BICARBONATE: 24.1 meq/L — AB (ref 20.0–24.0)
O2 Saturation: 96 %
PCO2 ART: 35.6 mmHg (ref 35.0–45.0)
PH ART: 7.439 (ref 7.350–7.450)
Patient temperature: 98.4
TCO2: 25 mmol/L (ref 0–100)
pO2, Arterial: 80 mmHg (ref 80.0–100.0)

## 2015-09-07 LAB — HEPARIN LEVEL (UNFRACTIONATED)
Heparin Unfractionated: 0.33 IU/mL (ref 0.30–0.70)
Heparin Unfractionated: 0.38 IU/mL (ref 0.30–0.70)

## 2015-09-07 LAB — BRAIN NATRIURETIC PEPTIDE: B NATRIURETIC PEPTIDE 5: 670 pg/mL — AB (ref 0.0–100.0)

## 2015-09-07 LAB — MAGNESIUM: MAGNESIUM: 2.2 mg/dL (ref 1.7–2.4)

## 2015-09-07 MED ORDER — SODIUM CHLORIDE 0.9 % IV SOLN
1.0000 mg/h | INTRAVENOUS | Status: DC
  Administered 2015-09-08 – 2015-09-09 (×2): 1 mg/h via INTRAVENOUS
  Filled 2015-09-07 (×2): qty 10

## 2015-09-07 NOTE — Progress Notes (Signed)
[place] Progress Note Patient Name: Patrick Sexton [facility] DOB: 07/16/1946 MRN: [id number]   Date of Service  09/07/2015  HPI/Events of Note  ABG on 50%/PRVC 30/TV 550/P 5 = 7.49/30.2/118/22.8.  eICU Interventions  Will order: 1. Change rate to 22. 2. Repeat ABG at 6:30 AM.     Intervention Category Major Interventions: Acid-Base disturbance - evaluation and management;Respiratory failure - evaluation and management  [name] 09/07/2015, 5:22 AM

## 2015-09-07 NOTE — Progress Notes (Signed)
ANTICOAGULATION CONSULT NOTE - Follow Up Consult  Pharmacy Consult for Heparin  Indication: chest pain/ACS, s/p cath, s/p hypothermia  No Known Allergies  Patient Measurements: Height: 5\' 9"  (175.3 cm) Weight: 196 lb 6.2 oz (89.08 kg) IBW/kg (Calculated) : 70.7  Vital Signs: Temp: 99 F (37.2 C) (01/10 0500) Temp Source: Core (Comment) (01/10 0400) BP: 117/66 mmHg (01/10 0500) Pulse Rate: 69 (01/10 0500)  Labs:  Recent Labs  09/19/2015 1845  09/05/15 0020  09/05/15 0424 09/05/15 0620  09/05/15 1100  09/05/15 1715  09/06/15 0127 09/06/15 0525  09/06/15 1350 09/06/15 2135 09/07/15 0437  HGB 14.6  --   --   --  13.5  --   < >  --   < > 16.3  --   --  11.6*  --   --   --  10.5*  HCT 46.1  --   --   --  42.7  --   < >  --   < > 48.0  --   --  35.3*  --   --   --  32.6*  PLT 471*  --   --   --  372  --   --   --   --   --   --   --  261  --   --   --  271  APTT 156*  --  34  --  44*  --   --   --   --   --   --   --   --   --   --   --   --   LABPROT 19.5*  --  18.1*  --  18.2*  --   --   --   --   --   --   --   --   --   --   --   --   INR 1.65*  --  1.49  --  1.50*  --   --   --   --   --   --   --   --   --   --   --   --   HEPARINUNFRC  --   --   --   --   --   --   --  0.28*  < >  --   --   --   --   < > 0.13* 0.16* 0.33  CREATININE  --   < >  --   < > 1.37*  --   < >  --   < > 1.00  < > 1.16 1.18  --   --   --  1.42*  TROPONINI 5.96*  < >  --   --  60.18* 64.90*  --  >65.00*  --   --   --   --   --   --   --   --   --   < > = values in this interval not displayed.  Estimated Creatinine Clearance: 54.2 mL/min (by C-G formula based on Cr of 1.42).   Assessment: Therapeutic heparin level x 1 after rate increase  Goal of Therapy:  Heparin level 0.3-0.7 units/ml Monitor platelets by anticoagulation protocol: Yes   Plan:  -Cont heparin 1400 units/hr -1200 HL  Todd Jelinski 09/07/2015,5:32 AM

## 2015-09-07 NOTE — Progress Notes (Signed)
Subjective:  POD # 2 SCD/ VF arrest/ Cath/ Artic Sun/ CGS/ Rewarming  Objective:  Temp:  [96.8 F (36 C)-99.5 F (37.5 C)] 98.4 F (36.9 C) (01/10 0700) Pulse Rate:  [60-88] 62 (01/10 0700) Resp:  [21-31] 22 (01/10 0700) BP: (78-134)/(46-69) 114/62 mmHg (01/10 0700) SpO2:  [93 %-100 %] 98 % (01/10 0700) FiO2 (%):  [40 %-50 %] 40 % (01/10 0519) Weight:  [196 lb 6.2 oz (89.08 kg)] 196 lb 6.2 oz (89.08 kg) (01/10 0500) Weight change: 5 lb 8.2 oz (2.5 kg)  Intake/Output from previous day: 01/09 0701 - 01/10 0700 In: 4115.4 [I.V.:2642.4; NG/GT:623; IV Piggyback:850] Out: 2070 [Urine:2070]  Intake/Output from this shift:    Physical Exam: General appearance: Intubated and sedates. ? follows commands Neck: no adenopathy, no carotid bruit, no JVD, supple, symmetrical, trachea midline and thyroid not enlarged, symmetric, no tenderness/mass/nodules Lungs: clear to auscultation bilaterally Heart: regular rate and rhythm, S1, S2 normal, no murmur, click, rub or gallop Extremities: extremities normal, atraumatic, no cyanosis or edema  Lab Results: Results for orders placed or performed during the hospital encounter of 09/16/2015 (from the past 48 hour(s))  Glucose, capillary     Status: Abnormal   Collection Time: 09/05/15  9:01 AM  Result Value Ref Range   Glucose-Capillary 141 (H) 65 - 99 mg/dL  I-STAT, chem 8     Status: Abnormal   Collection Time: 09/05/15  9:08 AM  Result Value Ref Range   Sodium 135 135 - 145 mmol/L   Potassium 3.3 (L) 3.5 - 5.1 mmol/L   Chloride 111 101 - 111 mmol/L   BUN 23 (H) 6 - 20 mg/dL   Creatinine, Ser 1.20 0.61 - 1.24 mg/dL   Glucose, Bld 149 (H) 65 - 99 mg/dL   Calcium, Ion 1.03 (L) 1.13 - 1.30 mmol/L   TCO2 18 0 - 100 mmol/L   Hemoglobin 14.3 13.0 - 17.0 g/dL   HCT 42.0 39.0 - 52.0 %  Glucose, capillary     Status: Abnormal   Collection Time: 09/05/15 10:14 AM  Result Value Ref Range   Glucose-Capillary 119 (H) 65 - 99 mg/dL  Troponin  I     Status: Abnormal   Collection Time: 09/05/15 11:00 AM  Result Value Ref Range   Troponin I >65.00 (HH) <0.031 ng/mL    Comment:        POSSIBLE MYOCARDIAL ISCHEMIA. SERIAL TESTING RECOMMENDED. CRITICAL VALUE NOTED.  VALUE IS CONSISTENT WITH PREVIOUSLY REPORTED AND CALLED VALUE.   Heparin level (unfractionated)     Status: Abnormal   Collection Time: 09/05/15 11:00 AM  Result Value Ref Range   Heparin Unfractionated 0.28 (L) 0.30 - 0.70 IU/mL    Comment:        IF HEPARIN RESULTS ARE BELOW EXPECTED VALUES, AND PATIENT DOSAGE HAS BEEN CONFIRMED, SUGGEST FOLLOW UP TESTING OF ANTITHROMBIN III LEVELS.   Glucose, capillary     Status: Abnormal   Collection Time: 09/05/15 11:22 AM  Result Value Ref Range   Glucose-Capillary 124 (H) 65 - 99 mg/dL   Comment 1 Arterial Specimen    Comment 2 Notify RN   Glucose, capillary     Status: Abnormal   Collection Time: 09/05/15 12:35 PM  Result Value Ref Range   Glucose-Capillary 129 (H) 65 - 99 mg/dL  I-STAT, chem 8     Status: Abnormal   Collection Time: 09/05/15 12:40 PM  Result Value Ref Range   Sodium 139 135 - 145 mmol/L  Potassium 3.1 (L) 3.5 - 5.1 mmol/L   Chloride 103 101 - 111 mmol/L   BUN 27 (H) 6 - 20 mg/dL   Creatinine, Ser 1.20 0.61 - 1.24 mg/dL   Glucose, Bld 144 (H) 65 - 99 mg/dL   Calcium, Ion 1.11 (L) 1.13 - 1.30 mmol/L   TCO2 19 0 - 100 mmol/L   Hemoglobin 14.3 13.0 - 17.0 g/dL   HCT 42.0 39.0 - 52.0 %  Glucose, capillary     Status: Abnormal   Collection Time: 09/05/15  4:12 PM  Result Value Ref Range   Glucose-Capillary 160 (H) 65 - 99 mg/dL   Comment 1 Arterial Specimen    Comment 2 Notify RN   Heparin level (unfractionated)     Status: Abnormal   Collection Time: 09/05/15  4:45 PM  Result Value Ref Range   Heparin Unfractionated 0.24 (L) 0.30 - 0.70 IU/mL    Comment:        IF HEPARIN RESULTS ARE BELOW EXPECTED VALUES, AND PATIENT DOSAGE HAS BEEN CONFIRMED, SUGGEST FOLLOW UP TESTING OF  ANTITHROMBIN III LEVELS.   I-STAT, chem 8     Status: Abnormal   Collection Time: 09/05/15  5:15 PM  Result Value Ref Range   Sodium 137 135 - 145 mmol/L   Potassium 4.0 3.5 - 5.1 mmol/L   Chloride 102 101 - 111 mmol/L   BUN 28 (H) 6 - 20 mg/dL   Creatinine, Ser 1.00 0.61 - 1.24 mg/dL   Glucose, Bld 169 (H) 65 - 99 mg/dL   Calcium, Ion 1.02 (L) 1.13 - 1.30 mmol/L   TCO2 19 0 - 100 mmol/L   Hemoglobin 16.3 13.0 - 17.0 g/dL   HCT 48.0 39.0 - 52.0 %  Glucose, capillary     Status: Abnormal   Collection Time: 09/05/15  8:20 PM  Result Value Ref Range   Glucose-Capillary 145 (H) 65 - 99 mg/dL   Comment 1 Arterial Specimen   Basic metabolic panel     Status: Abnormal   Collection Time: 09/05/15  9:25 PM  Result Value Ref Range   Sodium 136 135 - 145 mmol/L   Potassium 3.5 3.5 - 5.1 mmol/L   Chloride 102 101 - 111 mmol/L   CO2 21 (L) 22 - 32 mmol/L   Glucose, Bld 160 (H) 65 - 99 mg/dL   BUN 27 (H) 6 - 20 mg/dL   Creatinine, Ser 1.20 0.61 - 1.24 mg/dL   Calcium 7.3 (L) 8.9 - 10.3 mg/dL   GFR calc non Af Amer 60 (L) >60 mL/min   GFR calc Af Amer >60 >60 mL/min    Comment: (NOTE) The eGFR has been calculated using the CKD EPI equation. This calculation has not been validated in all clinical situations. eGFR's persistently <60 mL/min signify possible Chronic Kidney Disease.    Anion gap 13 5 - 15  Glucose, capillary     Status: Abnormal   Collection Time: 09/05/15  9:25 PM  Result Value Ref Range   Glucose-Capillary 137 (H) 65 - 99 mg/dL   Comment 1 Arterial Specimen   Glucose, capillary     Status: Abnormal   Collection Time: 09/05/15 10:36 PM  Result Value Ref Range   Glucose-Capillary 150 (H) 65 - 99 mg/dL   Comment 1 Arterial Specimen   Glucose, capillary     Status: Abnormal   Collection Time: 09/05/15 11:28 PM  Result Value Ref Range   Glucose-Capillary 148 (H) 65 - 99 mg/dL   Comment  1 Capillary Specimen   Basic metabolic panel     Status: Abnormal   Collection  Time: 09/06/15  1:27 AM  Result Value Ref Range   Sodium 137 135 - 145 mmol/L   Potassium 3.4 (L) 3.5 - 5.1 mmol/L   Chloride 100 (L) 101 - 111 mmol/L   CO2 21 (L) 22 - 32 mmol/L   Glucose, Bld 163 (H) 65 - 99 mg/dL   BUN 27 (H) 6 - 20 mg/dL   Creatinine, Ser 1.16 0.61 - 1.24 mg/dL   Calcium 7.1 (L) 8.9 - 10.3 mg/dL   GFR calc non Af Amer >60 >60 mL/min   GFR calc Af Amer >60 >60 mL/min    Comment: (NOTE) The eGFR has been calculated using the CKD EPI equation. This calculation has not been validated in all clinical situations. eGFR's persistently <60 mL/min signify possible Chronic Kidney Disease.    Anion gap 16 (H) 5 - 15  Glucose, capillary     Status: Abnormal   Collection Time: 09/06/15  4:22 AM  Result Value Ref Range   Glucose-Capillary 136 (H) 65 - 99 mg/dL   Comment 1 Arterial Specimen   Basic metabolic panel     Status: Abnormal   Collection Time: 09/06/15  5:25 AM  Result Value Ref Range   Sodium 136 135 - 145 mmol/L   Potassium 3.3 (L) 3.5 - 5.1 mmol/L   Chloride 100 (L) 101 - 111 mmol/L   CO2 23 22 - 32 mmol/L   Glucose, Bld 143 (H) 65 - 99 mg/dL   BUN 26 (H) 6 - 20 mg/dL   Creatinine, Ser 1.18 0.61 - 1.24 mg/dL   Calcium 6.9 (L) 8.9 - 10.3 mg/dL   GFR calc non Af Amer >60 >60 mL/min   GFR calc Af Amer >60 >60 mL/min    Comment: (NOTE) The eGFR has been calculated using the CKD EPI equation. This calculation has not been validated in all clinical situations. eGFR's persistently <60 mL/min signify possible Chronic Kidney Disease.    Anion gap 13 5 - 15  CBC     Status: Abnormal   Collection Time: 09/06/15  5:25 AM  Result Value Ref Range   WBC 12.1 (H) 4.0 - 10.5 K/uL    Comment: REPEATED TO VERIFY   RBC 4.65 4.22 - 5.81 MIL/uL   Hemoglobin 11.6 (L) 13.0 - 17.0 g/dL    Comment: SPECIMEN CHECKED FOR CLOTS REPEATED TO VERIFY    HCT 35.3 (L) 39.0 - 52.0 %   MCV 75.9 (L) 78.0 - 100.0 fL   MCH 24.9 (L) 26.0 - 34.0 pg   MCHC 32.9 30.0 - 36.0 g/dL   RDW  14.7 11.5 - 15.5 %   Platelets 261 150 - 400 K/uL    Comment: REPEATED TO VERIFY  Magnesium     Status: Abnormal   Collection Time: 09/06/15  5:25 AM  Result Value Ref Range   Magnesium 1.6 (L) 1.7 - 2.4 mg/dL  Phosphorus     Status: None   Collection Time: 09/06/15  5:25 AM  Result Value Ref Range   Phosphorus 3.4 2.5 - 4.6 mg/dL  Glucose, capillary     Status: Abnormal   Collection Time: 09/06/15  8:00 AM  Result Value Ref Range   Glucose-Capillary 121 (H) 65 - 99 mg/dL   Comment 1 Venous Specimen   Heparin level (unfractionated)     Status: Abnormal   Collection Time: 09/06/15  9:40 AM  Result Value  Ref Range   Heparin Unfractionated 0.10 (L) 0.30 - 0.70 IU/mL    Comment:        IF HEPARIN RESULTS ARE BELOW EXPECTED VALUES, AND PATIENT DOSAGE HAS BEEN CONFIRMED, SUGGEST FOLLOW UP TESTING OF ANTITHROMBIN III LEVELS.   TSH     Status: Abnormal   Collection Time: 09/06/15 10:09 AM  Result Value Ref Range   TSH 15.569 (H) 0.350 - 4.500 uIU/mL  Hepatic function panel     Status: Abnormal   Collection Time: 09/06/15 10:09 AM  Result Value Ref Range   Total Protein 4.6 (L) 6.5 - 8.1 g/dL   Albumin 2.0 (L) 3.5 - 5.0 g/dL   AST 232 (H) 15 - 41 U/L   ALT 172 (H) 17 - 63 U/L   Alkaline Phosphatase 63 38 - 126 U/L   Total Bilirubin 1.0 0.3 - 1.2 mg/dL   Bilirubin, Direct 0.4 0.1 - 0.5 mg/dL   Indirect Bilirubin 0.6 0.3 - 0.9 mg/dL  Carboxyhemoglobin     Status: Abnormal   Collection Time: 09/06/15 11:50 AM  Result Value Ref Range   Total hemoglobin 12.2 (L) 13.5 - 18.0 g/dL   O2 Saturation 62.7 %   Carboxyhemoglobin 1.0 0.5 - 1.5 %   Methemoglobin 0.6 0.0 - 1.5 %  Glucose, capillary     Status: None   Collection Time: 09/06/15 12:27 PM  Result Value Ref Range   Glucose-Capillary 74 65 - 99 mg/dL  Heparin level (unfractionated)     Status: Abnormal   Collection Time: 09/06/15  1:50 PM  Result Value Ref Range   Heparin Unfractionated 0.13 (L) 0.30 - 0.70 IU/mL     Comment:        IF HEPARIN RESULTS ARE BELOW EXPECTED VALUES, AND PATIENT DOSAGE HAS BEEN CONFIRMED, SUGGEST FOLLOW UP TESTING OF ANTITHROMBIN III LEVELS.   Glucose, capillary     Status: None   Collection Time: 09/06/15  4:23 PM  Result Value Ref Range   Glucose-Capillary 86 65 - 99 mg/dL  T4, free     Status: Abnormal   Collection Time: 09/06/15  8:53 PM  Result Value Ref Range   Free T4 1.39 (H) 0.61 - 1.12 ng/dL  Glucose, capillary     Status: None   Collection Time: 09/06/15  9:04 PM  Result Value Ref Range   Glucose-Capillary 73 65 - 99 mg/dL   Comment 1 Capillary Specimen   Heparin level (unfractionated)     Status: Abnormal   Collection Time: 09/06/15  9:35 PM  Result Value Ref Range   Heparin Unfractionated 0.16 (L) 0.30 - 0.70 IU/mL    Comment:        IF HEPARIN RESULTS ARE BELOW EXPECTED VALUES, AND PATIENT DOSAGE HAS BEEN CONFIRMED, SUGGEST FOLLOW UP TESTING OF ANTITHROMBIN III LEVELS.   Glucose, capillary     Status: Abnormal   Collection Time: 09/06/15 11:32 PM  Result Value Ref Range   Glucose-Capillary 105 (H) 65 - 99 mg/dL   Comment 1 Arterial Specimen   Blood gas, arterial     Status: Abnormal   Collection Time: 09/07/15  3:55 AM  Result Value Ref Range   FIO2 0.50    Delivery systems VENTILATOR    Mode PRESSURE REGULATED VOLUME CONTROL    VT 550 mL   LHR 30 resp/min   Peep/cpap 5.0 cm H20   pH, Arterial 7.491 (H) 7.350 - 7.450   pCO2 arterial 30.2 (L) 35.0 - 45.0 mmHg   pO2, Arterial  118 (H) 80.0 - 100.0 mmHg   Bicarbonate 22.8 20.0 - 24.0 mEq/L   TCO2 23.8 0 - 100 mmol/L   Acid-base deficit 0.2 0.0 - 2.0 mmol/L   O2 Saturation 98.6 %   Patient temperature 98.6    Collection site ARTERIAL LINE    Drawn by [phone]    Sample type ARTERIAL    Allens test (pass/fail) PASS PASS  Comprehensive metabolic panel     Status: Abnormal   Collection Time: 09/07/15  4:37 AM  Result Value Ref Range   Sodium 135 135 - 145 mmol/L   Potassium 4.3 3.5 - 5.1  mmol/L    Comment: DELTA CHECK NOTED   Chloride 99 (L) 101 - 111 mmol/L   CO2 24 22 - 32 mmol/L   Glucose, Bld 123 (H) 65 - 99 mg/dL   BUN 25 (H) 6 - 20 mg/dL   Creatinine, Ser 0.35 (H) 0.61 - 1.24 mg/dL   Calcium 6.8 (L) 8.9 - 10.3 mg/dL   Total Protein 5.0 (L) 6.5 - 8.1 g/dL   Albumin 2.2 (L) 3.5 - 5.0 g/dL   AST 634 (H) 15 - 41 U/L   ALT 153 (H) 17 - 63 U/L   Alkaline Phosphatase 71 38 - 126 U/L   Total Bilirubin 2.2 (H) 0.3 - 1.2 mg/dL   GFR calc non Af Amer 49 (L) >60 mL/min   GFR calc Af Amer 57 (L) >60 mL/min    Comment: (NOTE) The eGFR has been calculated using the CKD EPI equation. This calculation has not been validated in all clinical situations. eGFR's persistently <60 mL/min signify possible Chronic Kidney Disease.    Anion gap 12 5 - 15  CBC with Differential/Platelet     Status: Abnormal   Collection Time: 09/07/15  4:37 AM  Result Value Ref Range   WBC 21.1 (H) 4.0 - 10.5 K/uL   RBC 4.20 (L) 4.22 - 5.81 MIL/uL   Hemoglobin 10.5 (L) 13.0 - 17.0 g/dL   HCT 24.4 (L) 48.8 - 06.9 %   MCV 77.6 (L) 78.0 - 100.0 fL   MCH 25.0 (L) 26.0 - 34.0 pg   MCHC 32.2 30.0 - 36.0 g/dL   RDW 07.3 (H) 17.5 - 12.8 %   Platelets 271 150 - 400 K/uL   Neutrophils Relative % 94 %   Neutro Abs 19.9 (H) 1.7 - 7.7 K/uL   Lymphocytes Relative 3 %   Lymphs Abs 0.6 (L) 0.7 - 4.0 K/uL   Monocytes Relative 3 %   Monocytes Absolute 0.6 0.1 - 1.0 K/uL   Eosinophils Relative 0 %   Eosinophils Absolute 0.0 0.0 - 0.7 K/uL   Basophils Relative 0 %   Basophils Absolute 0.0 0.0 - 0.1 K/uL  Heparin level (unfractionated)     Status: None   Collection Time: 09/07/15  4:37 AM  Result Value Ref Range   Heparin Unfractionated 0.33 0.30 - 0.70 IU/mL    Comment:        IF HEPARIN RESULTS ARE BELOW EXPECTED VALUES, AND PATIENT DOSAGE HAS BEEN CONFIRMED, SUGGEST FOLLOW UP TESTING OF ANTITHROMBIN III LEVELS.   Glucose, capillary     Status: Abnormal   Collection Time: 09/07/15  4:41 AM  Result  Value Ref Range   Glucose-Capillary 116 (H) 65 - 99 mg/dL   Comment 1 Arterial Specimen   I-STAT 3, arterial blood gas (G3+)     Status: Abnormal   Collection Time: 09/07/15  6:31 AM  Result Value Ref  Range   pH, Arterial 7.439 7.350 - 7.450   pCO2 arterial 35.6 35.0 - 45.0 mmHg   pO2, Arterial 80.0 80.0 - 100.0 mmHg   Bicarbonate 24.1 (H) 20.0 - 24.0 mEq/L   TCO2 25 0 - 100 mmol/L   O2 Saturation 96.0 %   Patient temperature 98.4 F    Collection site BRACHIAL ARTERY    Drawn by Operator    Sample type ARTERIAL     Imaging: Imaging results have been reviewed Diffuse interstitial infiltrates c/w pulm edema vs ARDS  Tele: NSR  Assessment/Plan:   1. Principal Problem: 2.   Cardiac arrest while shoveling snow 3. Active Problems: 4.   HLD (hyperlipidemia) 5.   Essential hypertension 6.   H/O CABG x 4 2008 7.   VF i shocked twice in field 8.   PAF- NSR at LOV- now in AF with RVR post arrest 9.   LBBB (left bundle branch block) 10.   Elevated troponin 11.   Lactic acid acidosis 12.   Acute respiratory failure ([place]) 13.   Aspiration pneumonia ([place]) 14.   Time Spent Directly with Patient:  20 minutes  Length of Stay:  LOS: 3 days   POD # 2 SCD/ VF defib--->> NSR/ Cath/ Artic Sun--->> Rewarming. Still on Levo, off of Vasopressin. Still on IV Fentanyl, Amio and ATBX. Now getting steroids with subsequent increase in WBCs. Rewarmed. Eyes open . Responded to commands this AM. I/) + 9L. SCr starting to Rise. CVP 10-12. May need lasix. Will defer to PCCM. Still following Neuro recovery. EF was 25% at Cath.  Quay Burow 09/07/2015, 7:54 AM

## 2015-09-07 NOTE — Progress Notes (Signed)
PULMONARY / CRITICAL CARE MEDICINE   Name: Patrick Sexton [facility] MRN: [id number] DOB: 25-Sep-1945    ADMISSION DATE:  09/05/2015  REFERRING MD:  EDP  CHIEF COMPLAINT:  Cardiac arrest   HISTORY OF PRESENT ILLNESS:   70 yo with hx CAD who was shoveling snow and went down. 15 min Vfib arrest. Cath no culprit.  SUBJECTIVE:  rewarmed UOP increasing (1.9L yesterday)   Levo down  VITAL SIGNS: BP 117/66 mmHg  Pulse 69  Temp(Src) 99 F (37.2 C) (Core (Comment))  Resp 30  Ht 5\' 9"  (1.753 m)  Wt 89.08 kg (196 lb 6.2 oz)  BMI 28.99 kg/m2  SpO2 98%  HEMODYNAMICS: CVP:  [12 mmHg-16 mmHg] 13 mmHg  VENTILATOR SETTINGS: Vent Mode:  [-] PRVC FiO2 (%):  [40 %-50 %] 40 % Set Rate:  [22 bmp-30 bmp] 22 bmp Vt Set:  [550 mL] 550 mL PEEP:  [5 cmH20-8 cmH20] 5 cmH20 Plateau Pressure:  [15 cmH20-31 cmH20] 28 cmH20  INTAKE / OUTPUT: I/O last 3 completed shifts: In: 8576.2 [I.V.:7189.2; NG/GT:237; IV Piggyback:1150] Out: [id number] [Urine:1367; Emesis/NG output:250]  PHYSICAL EXAMINATION: General: acutely ill , sedated, rass -2, moves all ext HEENT:PERL, followed commands yeah!!!!! Cardiovascular:  RRR s1 s2 Lungs: ronchi, thick secretions Abdomen:  Soft + bs, no r/g Musculoskeletal: intact Skin: cool  LABS:  BMET  Recent Labs Lab 09/06/15 0127 09/06/15 0525 09/07/15 0437  NA 137 136 135  K 3.4* 3.3* 4.3  CL 100* 100* 99*  CO2 21* 23 24  BUN 27* 26* 25*  CREATININE 1.16 1.18 1.42*  GLUCOSE 163* 143* 123*    Electrolytes  Recent Labs Lab 09/08/2015 1845  09/05/15 0424  09/06/15 0127 09/06/15 0525 09/07/15 0437  CALCIUM  --   < > 7.7*  < > 7.1* 6.9* 6.8*  MG 1.8  --  2.5*  --   --  1.6*  --   PHOS 5.9*  --   --   --   --  3.4  --   < > = values in this interval not displayed.  CBC  Recent Labs Lab 09/05/15 0424  09/05/15 1715 09/06/15 0525 09/07/15 0437  WBC 10.5  --   --  12.1* 21.1*  HGB 13.5  < > 16.3 11.6* 10.5*  HCT 42.7  < > 48.0 35.3* 32.6*  PLT 372  --   --   261 271  < > = values in this interval not displayed.  Coag's  Recent Labs Lab 09/03/2015 1845 09/05/15 0020 09/05/15 0424  APTT 156* 34 44*  INR 1.65* 1.49 1.50*    Sepsis Markers  Recent Labs Lab 09/22/2015 1523 09/24/2015 1651 09/20/2015 1845 09/05/2015 2341  LATICACIDVEN 7.63* 3.5*  --  4.8*  PROCALCITON  --   --  0.29  --     ABG  Recent Labs Lab 08/30/2015 2205 09/05/15 0638 09/07/15 0355  PHART 7.361 7.341* 7.491*  PCO2ART 25.2* 27.8* 30.2*  PO2ART 76.0* 73.0* 118*    Liver Enzymes  Recent Labs Lab 09/24/2015 1520 09/06/15 1009 09/07/15 0437  AST 145* 232* 161*  ALT 137* 172* 153*  ALKPHOS 87 63 71  BILITOT 0.8 1.0 2.2*  ALBUMIN 3.2* 2.0* 2.2*    Cardiac Enzymes  Recent Labs Lab 09/05/15 0424 09/05/15 0620 09/05/15 1100  TROPONINI 60.18* 64.90* >65.00*    Glucose  Recent Labs Lab 09/06/15 0422 09/06/15 0800 09/06/15 1227 09/06/15 1623 09/06/15 2104 09/07/15 0441  GLUCAP 136* 121* 74 86 73 116*  Imaging No results found.   STUDIES:  CT head 1/7>> mild atrophy LHC 1/7>> no intervention but severe LV dysfunction and severe native and graft disease CXR 1/9>> EEG 1/9>>Abnormal with moderate generalized nonspecific continuous slowing of cerebral activity, no epileptic disorder  CULTURES: 1/7 MRSA>>NEG 1/7 Urine Cx>>NEG 1/7 Blood Cx>> 1/7 Tracheal Cx>>  ANTIBIOTICS: none  SIGNIFICANT EVENTS: 1/7cardiac arrest 1/8- shock 1/9- rewarm 1/10 follows commands  LINES/TUBES: 1/7 et>> 1/7 left IJ>>> 1/7 rt rad a line>>>  DISCUSSION: 33  yom with cad post arrest  ASSESSMENT / PLAN:  PULMONARY A: VDRF post arrest Suspected aspiration  Edema>? P:   Vent bundle Weaning ps 15 needed Peep 5 was successful Lasix when BP allows  CARDIOVASCULAR A:  Post cardiac arrest while shoveling snow. 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 without clear culprit for infarct h/o CABG  CHF Shock (cardiogenic,  r/o septic / sirs) Rel AI P:  Per Cards Asa Heparin drip, consider dc? Levophed to map goal change 60 with EF noted Cont stress steroids   RENAL A:   AKI, UOP increasing (2L yesterday) Metab acidosis Hypokalemia, hypomag (rewarm though) P:   Replete lytes as needed  kvo May need lasix, holding on levo at 20  GASTROINTESTINAL A:  Elevated transaminases - likely d/t shock liver  GI protection P:   PPI lft TFs  HEMATOLOGIC A:   Anticoagulation per cardiology  leukocytpsis P:  PTT for heparin Cbc in am   INFECTIOUS A:   Possible aspiration P:   1/7 unasyn>>>1/9 vanc 1/9>>> Zosyn 1/9>>> Follow sputum further  ENDOCRINE A:   Hyperglycemia, TSh elevated, T 4 elevated - r/o sick euthyroid P:   Insulin ssi Get t3  NEUROLOGIC A:   Currently sedated post intubation following cardiac arrest while shoveling snow. At risk anoxia - EEG abnormal showing moderate generalized nonspecific slowing  P:   RASS goal: 0 Rewarm, dc paralysis, WUA today - fc yeah!!   Patrick Jewels, MD Internal Medicine, PGY-3 09/07/2015 5:58 AM  STAFF NOTE: Patrick Dibbles, MD FACP have personally reviewed patient's available data, including medical history, events of note, physical examination and test results as part of my evaluation. I have discussed with resident/NP and other care providers such as pharmacist, RN and RRT. In addition, I personally evaluated patient and elicited key findings of:  Followed commands, fent drip on going would like to limit this if able, pcxr with ronchi, edema? PNA, continued current abx coverage, renal fxn to follow increased but crt rise, may  Need lasix earlier, even if pressors not improved, accept lower MAP goals with sys fxn noted, TF to goal, assess t3 for likely sick euthyroid The patient is critically ill with multiple organ systems failure and requires high complexity decision making for assessment and support, frequent evaluation and titration  of therapies, application of advanced monitoring technologies and extensive interpretation of multiple databases.   Critical Care Time devoted to patient care services described in this note is 30 Minutes. This time reflects time of care of this signee: Patrick Roof, MD FACP. This critical care time does not reflect procedure time, or teaching time or supervisory time of PA/NP/Med student/Med Resident etc but could involve care discussion time. Rest per NP/medical resident whose note is outlined above and that I agree with   Lavon Paganini. Titus Mould, MD, [place] Pgr: [facility] 09/07/2015 9:30 AM

## 2015-09-07 NOTE — Progress Notes (Addendum)
ANTICOAGULATION CONSULT NOTE - Follow Up Consult  Pharmacy Consult for Heparin  Indication: chest pain/ACS, s/p cath, s/p hypothermia  No Known Allergies  Patient Measurements: Height: 5\' 9"  (175.3 cm) Weight: 196 lb 6.2 oz (89.08 kg) IBW/kg (Calculated) : 70.7  Vital Signs: Temp: 98.4 F (36.9 C) (01/10 1200) Temp Source: Core (Comment) (01/10 1200) BP: 97/62 mmHg (01/10 1200) Pulse Rate: 83 (01/10 1210)  Labs:  Recent Labs  09/08/2015 1845  09/05/15 0020  09/05/15 0424 09/05/15 0620  09/05/15 1100  09/05/15 1715  09/06/15 0127 09/06/15 0525  09/06/15 2135 09/07/15 0437 09/07/15 1200  HGB 14.6  --   --   --  13.5  --   < >  --   < > 16.3  --   --  11.6*  --   --  10.5*  --   HCT 46.1  --   --   --  42.7  --   < >  --   < > 48.0  --   --  35.3*  --   --  32.6*  --   PLT 471*  --   --   --  372  --   --   --   --   --   --   --  261  --   --  271  --   APTT 156*  --  34  --  44*  --   --   --   --   --   --   --   --   --   --   --   --   LABPROT 19.5*  --  18.1*  --  18.2*  --   --   --   --   --   --   --   --   --   --   --   --   INR 1.65*  --  1.49  --  1.50*  --   --   --   --   --   --   --   --   --   --   --   --   HEPARINUNFRC  --   --   --   --   --   --   --  0.28*  < >  --   --   --   --   < > 0.16* 0.33 0.38  CREATININE  --   < >  --   < > 1.37*  --   < >  --   < > 1.00  < > 1.16 1.18  --   --  1.42*  --   TROPONINI 5.96*  < >  --   --  60.18* 64.90*  --  >65.00*  --   --   --   --   --   --   --   --   --   < > = values in this interval not displayed.  Estimated Creatinine Clearance: 54.2 mL/min (by C-G formula based on Cr of 1.42).   Assessment: 70 y/o M s/p cardiac arrest while shoveling snow, now s/p cath. Heparin started 6 hours post-sheath removal (~0020). Rewarming from 33 degree hypothermia protocol completed at 1015 on 1/9.  Hgb 14.6 > 11.6 > 10.5, Plt 471 > 261 > 271. Likely dilutional drop as pt currently +9 L since admit. UOP improved  yesterday, will continue to monitor. No reports of bleeding.  2nd heparin level is therapeutic at 0.38 after rate increase to 1400 units/hr.   Goal of Therapy:  Heparin level 0.3-0.7 units/ml Monitor platelets by anticoagulation protocol: Yes   Plan:  - Cont heparin 1400 units/hr - Daily HL/CBC - monitor for s/sx of bleeding - f/u transition to long-term AC if needed; pt w/ pAF since arrest, no AC PTA  Governor Specking, PharmD Clinical Pharmacy Resident Pager: [phone] 09/07/2015,1:06 PM

## 2015-09-07 NOTE — Progress Notes (Signed)
Per RN, pt on less pressors today.  Pt did not tol SBT d/t low min. Volume, desat 86-88%, low vT 51ml.   Increased PSV to 10 then 15.  Pt tol 15 ps fairly well, increased fio2 to 50% d/t sat  89-90 on 40%- RN aware.

## 2015-09-08 ENCOUNTER — Inpatient Hospital Stay (HOSPITAL_COMMUNITY): Payer: Medicare Other

## 2015-09-08 DIAGNOSIS — Z4659 Encounter for fitting and adjustment of other gastrointestinal appliance and device: Secondary | ICD-10-CM | POA: Insufficient documentation

## 2015-09-08 LAB — COMPREHENSIVE METABOLIC PANEL
ALBUMIN: 2.1 g/dL — AB (ref 3.5–5.0)
ALK PHOS: 114 U/L (ref 38–126)
ALT: 113 U/L — ABNORMAL HIGH (ref 17–63)
ANION GAP: 8 (ref 5–15)
AST: 98 U/L — ABNORMAL HIGH (ref 15–41)
BUN: 32 mg/dL — ABNORMAL HIGH (ref 6–20)
CALCIUM: 7 mg/dL — AB (ref 8.9–10.3)
CO2: 24 mmol/L (ref 22–32)
Chloride: 99 mmol/L — ABNORMAL LOW (ref 101–111)
Creatinine, Ser: 1.06 mg/dL (ref 0.61–1.24)
GFR calc Af Amer: >60 mL/min (ref >60)
GFR calc non Af Amer: >60 mL/min (ref >60)
GLUCOSE: 121 mg/dL — AB (ref 65–99)
POTASSIUM: 3.8 mmol/L (ref 3.5–5.1)
SODIUM: 131 mmol/L — AB (ref 135–145)
Total Bilirubin: 1.7 mg/dL — ABNORMAL HIGH (ref 0.3–1.2)
Total Protein: 5.3 g/dL — ABNORMAL LOW (ref 6.5–8.1)

## 2015-09-08 LAB — GLUCOSE, CAPILLARY
GLUCOSE-CAPILLARY: 102 mg/dL — AB (ref 65–99)
GLUCOSE-CAPILLARY: 120 mg/dL — AB (ref 65–99)
GLUCOSE-CAPILLARY: 104 mg/dL — AB (ref 65–99)
Glucose-Capillary: 127 mg/dL — ABNORMAL HIGH (ref 65–99)
Glucose-Capillary: 124 mg/dL — ABNORMAL HIGH (ref 65–99)
Glucose-Capillary: 100 mg/dL — ABNORMAL HIGH (ref 65–99)
Glucose-Capillary: 116 mg/dL — ABNORMAL HIGH (ref 65–99)

## 2015-09-08 LAB — CBC
HEMATOCRIT: 29.3 % — AB (ref 39.0–52.0)
Hemoglobin: 9.2 g/dL — ABNORMAL LOW (ref 13.0–17.0)
MCH: 24.6 pg — ABNORMAL LOW (ref 26.0–34.0)
MCHC: 31.4 g/dL (ref 30.0–36.0)
MCV: 78.3 fL (ref 78.0–100.0)
Platelets: 172 10*3/uL (ref 150–400)
RBC: 3.74 MIL/uL — AB (ref 4.22–5.81)
RDW: 15.8 % — AB (ref 11.5–15.5)
WBC: 13.2 10*3/uL — AB (ref 4.0–10.5)

## 2015-09-08 LAB — HEPARIN LEVEL (UNFRACTIONATED)
Heparin Unfractionated: 0.23 IU/mL — ABNORMAL LOW (ref 0.30–0.70)
Heparin Unfractionated: 0.43 IU/mL (ref 0.30–0.70)

## 2015-09-08 LAB — T3: T3 TOTAL: 58 ng/dL — AB (ref 71–180)

## 2015-09-08 MED ORDER — POTASSIUM CHLORIDE 20 MEQ/15ML (10%) PO SOLN
20.0000 meq | Freq: Once | ORAL | Status: AC
  Administered 2015-09-08: 20 meq via ORAL
  Filled 2015-09-08: qty 15

## 2015-09-08 MED ORDER — AMIODARONE HCL 200 MG PO TABS
400.0000 mg | ORAL_TABLET | Freq: Two times a day (BID) | ORAL | Status: DC
  Administered 2015-09-08 – 2015-09-09 (×2): 400 mg
  Filled 2015-09-08 (×2): qty 2

## 2015-09-08 MED ORDER — PANTOPRAZOLE SODIUM 40 MG PO PACK
40.0000 mg | PACK | Freq: Every day | ORAL | Status: DC
  Administered 2015-09-08 – 2015-09-21 (×14): 40 mg
  Filled 2015-09-08 (×14): qty 20

## 2015-09-08 MED ORDER — FUROSEMIDE 10 MG/ML IJ SOLN
40.0000 mg | Freq: Two times a day (BID) | INTRAMUSCULAR | Status: DC
  Administered 2015-09-08 – 2015-09-09 (×3): 40 mg via INTRAVENOUS
  Filled 2015-09-08 (×3): qty 4

## 2015-09-08 MED ORDER — SODIUM CHLORIDE 0.9 % IV SOLN
3.0000 g | Freq: Four times a day (QID) | INTRAVENOUS | Status: AC
  Administered 2015-09-08 – 2015-09-12 (×18): 3 g via INTRAVENOUS
  Filled 2015-09-08 (×22): qty 3

## 2015-09-08 MED ORDER — AMIODARONE HCL 200 MG PO TABS: 200.0000 mg | ORAL_TABLET | Freq: Two times a day (BID) | ORAL | Status: DC

## 2015-09-08 MED ORDER — ACETYLCYSTEINE 20 % IN SOLN
4.0000 mL | Freq: Two times a day (BID) | RESPIRATORY_TRACT | Status: AC
  Administered 2015-09-08 – 2015-09-10 (×4): 4 mL via RESPIRATORY_TRACT
  Filled 2015-09-08 (×7): qty 4

## 2015-09-08 MED ORDER — ALBUTEROL SULFATE (2.5 MG/3ML) 0.083% IN NEBU
2.5000 mg | INHALATION_SOLUTION | Freq: Two times a day (BID) | RESPIRATORY_TRACT | Status: DC
  Administered 2015-09-08 – 2015-09-16 (×17): 2.5 mg via RESPIRATORY_TRACT
  Filled 2015-09-08 (×18): qty 3

## 2015-09-08 MED ORDER — AMIODARONE HCL 200 MG PO TABS
400.0000 mg | ORAL_TABLET | Freq: Two times a day (BID) | ORAL | Status: DC
  Administered 2015-09-08: 400 mg
  Filled 2015-09-08: qty 2

## 2015-09-08 NOTE — Progress Notes (Addendum)
Subjective:  Intubated, sedated but tracks and follows simple commands, POD #3 SCD/ VF arrest/ Cath/ CGS/VDRF/ [place] Sun/ Rewarming  Objective:  Temp:  [97.9 F (36.6 C)-99.5 F (37.5 C)] 98 F (36.7 C) (01/11 0736) Pulse Rate:  [35-88] 46 (01/11 0600) Resp:  [12-26] 24 (01/11 0600) BP: (86-153)/(55-83) 109/65 mmHg (01/11 0600) SpO2:  [82 %-100 %] 93 % (01/11 0600) FiO2 (%):  [50 %-70 %] 50 % (01/11 0400) Weight:  [197 lb 8.2 oz (89.59 kg)] 197 lb 8.2 oz (89.59 kg) (01/11 0346) Weight change: 1 lb 2 oz (0.51 kg)  Intake/Output from previous day: 01/10 0701 - 01/11 0700 In: 2202.1 [I.V.:987.1; NG/GT:765; IV Piggyback:450] Out: 1125 [Urine:1125]  Intake/Output from this shift:    Physical Exam: General appearance: Intubated and sedated Neck: no adenopathy, no carotid bruit, no JVD, supple, symmetrical, trachea midline and thyroid not enlarged, symmetric, no tenderness/mass/nodules Lungs: clear to auscultation bilaterally Heart: regular rate and rhythm, S1, S2 normal, no murmur, click, rub or gallop Extremities: extremities normal, atraumatic, no cyanosis or edema  Lab Results: Results for orders placed or performed during the hospital encounter of 09/20/2015 (from the past 48 hour(s))  Heparin level (unfractionated)     Status: Abnormal   Collection Time: 09/06/15  9:40 AM  Result Value Ref Range   Heparin Unfractionated 0.10 (L) 0.30 - 0.70 IU/mL    Comment:        IF HEPARIN RESULTS ARE BELOW EXPECTED VALUES, AND PATIENT DOSAGE HAS BEEN CONFIRMED, SUGGEST FOLLOW UP TESTING OF ANTITHROMBIN III LEVELS.   TSH     Status: Abnormal   Collection Time: 09/06/15 10:09 AM  Result Value Ref Range   TSH 15.569 (H) 0.350 - 4.500 uIU/mL  Hepatic function panel     Status: Abnormal   Collection Time: 09/06/15 10:09 AM  Result Value Ref Range   Total Protein 4.6 (L) 6.5 - 8.1 g/dL   Albumin 2.0 (L) 3.5 - 5.0 g/dL   AST 232 (H) 15 - 41 U/L   ALT 172 (H) 17 - 63 U/L   Alkaline Phosphatase 63 38 - 126 U/L   Total Bilirubin 1.0 0.3 - 1.2 mg/dL   Bilirubin, Direct 0.4 0.1 - 0.5 mg/dL   Indirect Bilirubin 0.6 0.3 - 0.9 mg/dL  Carboxyhemoglobin     Status: Abnormal   Collection Time: 09/06/15 11:50 AM  Result Value Ref Range   Total hemoglobin 12.2 (L) 13.5 - 18.0 g/dL   O2 Saturation 62.7 %   Carboxyhemoglobin 1.0 0.5 - 1.5 %   Methemoglobin 0.6 0.0 - 1.5 %  Glucose, capillary     Status: None   Collection Time: 09/06/15 12:27 PM  Result Value Ref Range   Glucose-Capillary 74 65 - 99 mg/dL  Heparin level (unfractionated)     Status: Abnormal   Collection Time: 09/06/15  1:50 PM  Result Value Ref Range   Heparin Unfractionated 0.13 (L) 0.30 - 0.70 IU/mL    Comment:        IF HEPARIN RESULTS ARE BELOW EXPECTED VALUES, AND PATIENT DOSAGE HAS BEEN CONFIRMED, SUGGEST FOLLOW UP TESTING OF ANTITHROMBIN III LEVELS.   Glucose, capillary     Status: None   Collection Time: 09/06/15  4:23 PM  Result Value Ref Range   Glucose-Capillary 86 65 - 99 mg/dL  T4, free     Status: Abnormal   Collection Time: 09/06/15  8:53 PM  Result Value Ref Range   Free T4 1.39 (H) 0.61 -  1.12 ng/dL  Glucose, capillary     Status: None   Collection Time: 09/06/15  9:04 PM  Result Value Ref Range   Glucose-Capillary 73 65 - 99 mg/dL   Comment 1 Capillary Specimen   Heparin level (unfractionated)     Status: Abnormal   Collection Time: 09/06/15  9:35 PM  Result Value Ref Range   Heparin Unfractionated 0.16 (L) 0.30 - 0.70 IU/mL    Comment:        IF HEPARIN RESULTS ARE BELOW EXPECTED VALUES, AND PATIENT DOSAGE HAS BEEN CONFIRMED, SUGGEST FOLLOW UP TESTING OF ANTITHROMBIN III LEVELS.   Glucose, capillary     Status: Abnormal   Collection Time: 09/06/15 11:32 PM  Result Value Ref Range   Glucose-Capillary 105 (H) 65 - 99 mg/dL   Comment 1 Arterial Specimen   Blood gas, arterial     Status: Abnormal   Collection Time: 09/07/15  3:55 AM  Result Value Ref Range    FIO2 0.50    Delivery systems VENTILATOR    Mode PRESSURE REGULATED VOLUME CONTROL    VT 550 mL   LHR 30 resp/min   Peep/cpap 5.0 cm H20   pH, Arterial 7.491 (H) 7.350 - 7.450   pCO2 arterial 30.2 (L) 35.0 - 45.0 mmHg   pO2, Arterial 118 (H) 80.0 - 100.0 mmHg   Bicarbonate 22.8 20.0 - 24.0 mEq/L   TCO2 23.8 0 - 100 mmol/L   Acid-base deficit 0.2 0.0 - 2.0 mmol/L   O2 Saturation 98.6 %   Patient temperature 98.6    Collection site ARTERIAL LINE    Drawn by [id number]    Sample type ARTERIAL    Allens test (pass/fail) PASS PASS  Comprehensive metabolic panel     Status: Abnormal   Collection Time: 09/07/15  4:37 AM  Result Value Ref Range   Sodium 135 135 - 145 mmol/L   Potassium 4.3 3.5 - 5.1 mmol/L    Comment: DELTA CHECK NOTED   Chloride 99 (L) 101 - 111 mmol/L   CO2 24 22 - 32 mmol/L   Glucose, Bld 123 (H) 65 - 99 mg/dL   BUN 25 (H) 6 - 20 mg/dL   Creatinine, Ser 1.42 (H) 0.61 - 1.24 mg/dL   Calcium 6.8 (L) 8.9 - 10.3 mg/dL   Total Protein 5.0 (L) 6.5 - 8.1 g/dL   Albumin 2.2 (L) 3.5 - 5.0 g/dL   AST 161 (H) 15 - 41 U/L   ALT 153 (H) 17 - 63 U/L   Alkaline Phosphatase 71 38 - 126 U/L   Total Bilirubin 2.2 (H) 0.3 - 1.2 mg/dL   GFR calc non Af Amer 49 (L) >60 mL/min   GFR calc Af Amer 57 (L) >60 mL/min    Comment: (NOTE) The eGFR has been calculated using the CKD EPI equation. This calculation has not been validated in all clinical situations. eGFR's persistently <60 mL/min signify possible Chronic Kidney Disease.    Anion gap 12 5 - 15  CBC with Differential/Platelet     Status: Abnormal   Collection Time: 09/07/15  4:37 AM  Result Value Ref Range   WBC 21.1 (H) 4.0 - 10.5 K/uL   RBC 4.20 (L) 4.22 - 5.81 MIL/uL   Hemoglobin 10.5 (L) 13.0 - 17.0 g/dL   HCT 32.6 (L) 39.0 - 52.0 %   MCV 77.6 (L) 78.0 - 100.0 fL   MCH 25.0 (L) 26.0 - 34.0 pg   MCHC 32.2 30.0 - 36.0 g/dL  RDW 15.7 (H) 11.5 - 15.5 %   Platelets 271 150 - 400 K/uL   Neutrophils Relative % 94 %    Neutro Abs 19.9 (H) 1.7 - 7.7 K/uL   Lymphocytes Relative 3 %   Lymphs Abs 0.6 (L) 0.7 - 4.0 K/uL   Monocytes Relative 3 %   Monocytes Absolute 0.6 0.1 - 1.0 K/uL   Eosinophils Relative 0 %   Eosinophils Absolute 0.0 0.0 - 0.7 K/uL   Basophils Relative 0 %   Basophils Absolute 0.0 0.0 - 0.1 K/uL  Heparin level (unfractionated)     Status: None   Collection Time: 09/07/15  4:37 AM  Result Value Ref Range   Heparin Unfractionated 0.33 0.30 - 0.70 IU/mL    Comment:        IF HEPARIN RESULTS ARE BELOW EXPECTED VALUES, AND PATIENT DOSAGE HAS BEEN CONFIRMED, SUGGEST FOLLOW UP TESTING OF ANTITHROMBIN III LEVELS.   Glucose, capillary     Status: Abnormal   Collection Time: 09/07/15  4:41 AM  Result Value Ref Range   Glucose-Capillary 116 (H) 65 - 99 mg/dL   Comment 1 Arterial Specimen   I-STAT 3, arterial blood gas (G3+)     Status: Abnormal   Collection Time: 09/07/15  6:31 AM  Result Value Ref Range   pH, Arterial 7.439 7.350 - 7.450   pCO2 arterial 35.6 35.0 - 45.0 mmHg   pO2, Arterial 80.0 80.0 - 100.0 mmHg   Bicarbonate 24.1 (H) 20.0 - 24.0 mEq/L   TCO2 25 0 - 100 mmol/L   O2 Saturation 96.0 %   Patient temperature 98.4 F    Collection site BRACHIAL ARTERY    Drawn by Operator    Sample type ARTERIAL   Magnesium     Status: None   Collection Time: 09/07/15  8:30 AM  Result Value Ref Range   Magnesium 2.2 1.7 - 2.4 mg/dL  Glucose, capillary     Status: Abnormal   Collection Time: 09/07/15  8:33 AM  Result Value Ref Range   Glucose-Capillary 130 (H) 65 - 99 mg/dL   Comment 1 Arterial Specimen   Brain natriuretic peptide     Status: Abnormal   Collection Time: 09/07/15 10:57 AM  Result Value Ref Range   B Natriuretic Peptide 670.0 (H) 0.0 - 100.0 pg/mL  T3     Status: Abnormal   Collection Time: 09/07/15 10:58 AM  Result Value Ref Range   T3, Total 58 (L) 71 - 180 ng/dL    Comment: (NOTE) Performed At: [facility] [place], [place]  [id number] Lindon Romp MD UV:[id number]   Glucose, capillary     Status: Abnormal   Collection Time: 09/07/15 11:12 AM  Result Value Ref Range   Glucose-Capillary 131 (H) 65 - 99 mg/dL   Comment 1 Capillary Specimen   Heparin level (unfractionated)     Status: None   Collection Time: 09/07/15 12:00 PM  Result Value Ref Range   Heparin Unfractionated 0.38 0.30 - 0.70 IU/mL    Comment:        IF HEPARIN RESULTS ARE BELOW EXPECTED VALUES, AND PATIENT DOSAGE HAS BEEN CONFIRMED, SUGGEST FOLLOW UP TESTING OF ANTITHROMBIN III LEVELS.   Glucose, capillary     Status: Abnormal   Collection Time: 09/07/15  4:09 PM  Result Value Ref Range   Glucose-Capillary 115 (H) 65 - 99 mg/dL  Glucose, capillary     Status: Abnormal   Collection Time: 09/07/15  8:48 PM  Result Value Ref Range   Glucose-Capillary 127 (H) 65 - 99 mg/dL   Comment 1 Arterial Specimen   Glucose, capillary     Status: Abnormal   Collection Time: 09/07/15 11:13 PM  Result Value Ref Range   Glucose-Capillary 102 (H) 65 - 99 mg/dL  CBC     Status: Abnormal   Collection Time: 09/08/15  3:45 AM  Result Value Ref Range   WBC 13.2 (H) 4.0 - 10.5 K/uL   RBC 3.74 (L) 4.22 - 5.81 MIL/uL   Hemoglobin 9.2 (L) 13.0 - 17.0 g/dL   HCT 29.3 (L) 39.0 - 52.0 %   MCV 78.3 78.0 - 100.0 fL   MCH 24.6 (L) 26.0 - 34.0 pg   MCHC 31.4 30.0 - 36.0 g/dL   RDW 15.8 (H) 11.5 - 15.5 %   Platelets 172 150 - 400 K/uL  Heparin level (unfractionated)     Status: Abnormal   Collection Time: 09/08/15  3:45 AM  Result Value Ref Range   Heparin Unfractionated 0.23 (L) 0.30 - 0.70 IU/mL    Comment:        IF HEPARIN RESULTS ARE BELOW EXPECTED VALUES, AND PATIENT DOSAGE HAS BEEN CONFIRMED, SUGGEST FOLLOW UP TESTING OF ANTITHROMBIN III LEVELS.   Comprehensive metabolic panel     Status: Abnormal   Collection Time: 09/08/15  3:45 AM  Result Value Ref Range   Sodium 131 (L) 135 - 145 mmol/L   Potassium 3.8 3.5 - 5.1 mmol/L   Chloride 99 (L)  101 - 111 mmol/L   CO2 24 22 - 32 mmol/L   Glucose, Bld 121 (H) 65 - 99 mg/dL   BUN 32 (H) 6 - 20 mg/dL   Creatinine, Ser 1.06 0.61 - 1.24 mg/dL   Calcium 7.0 (L) 8.9 - 10.3 mg/dL   Total Protein 5.3 (L) 6.5 - 8.1 g/dL   Albumin 2.1 (L) 3.5 - 5.0 g/dL   AST 98 (H) 15 - 41 U/L   ALT 113 (H) 17 - 63 U/L   Alkaline Phosphatase 114 38 - 126 U/L   Total Bilirubin 1.7 (H) 0.3 - 1.2 mg/dL   GFR calc non Af Amer >60 >60 mL/min   GFR calc Af Amer >60 >60 mL/min    Comment: (NOTE) The eGFR has been calculated using the CKD EPI equation. This calculation has not been validated in all clinical situations. eGFR's persistently <60 mL/min signify possible Chronic Kidney Disease.    Anion gap 8 5 - 15  Glucose, capillary     Status: Abnormal   Collection Time: 09/08/15  3:45 AM  Result Value Ref Range   Glucose-Capillary 120 (H) 65 - 99 mg/dL   Comment 1 Arterial Specimen     Imaging: Imaging results have been reviewed I have personally reviewed  Tele- NSR  Assessment/Plan:   1. Principal Problem: 2.   Cardiac arrest while shoveling snow 3. Active Problems: 4.   HLD (hyperlipidemia) 5.   Essential hypertension 6.   H/O CABG x 4 2008 7.   VF i shocked twice in field 8.   PAF- NSR at LOV- now in AF with RVR post arrest 9.   LBBB (left bundle branch block) 10.   Elevated troponin 11.   Lactic acid acidosis 12.   Acute respiratory failure ([place]) 13.   Aspiration pneumonia ([place]) 14.   Acute respiratory failure with hypoxemia ([facility]) 15.   Encounter for central line placement 16.   Time Spent Directly with Patient:  20  minutes  Length of Stay:  LOS: 4 days   Still intubated and sedated. POD #3 SCD/Cath. No culprit vessel. EF 25%. Rewarmed from [place]. On IV hep/ fentanyl/ Amio/ Levophed---> weaning. Opens eyes and tracks. Follows simple commands intermittently. He has had some intermittent PAF currently in NSR. Vent wean per PCCM. No change Rx. K 3.0--> Replete. Will  S/O but avail if needed.   Quay Burow 09/08/2015, 8:49 AM

## 2015-09-08 NOTE — Progress Notes (Signed)
Pharmacist Heart Failure Core Measure Documentation  Assessment: Patrick Sexton has an EF documented as 25%  by LHC.  Rationale: Heart failure patients with left ventricular systolic dysfunction (LVSD) and an EF < 40% should be prescribed an angiotensin converting enzyme inhibitor (ACEI) or angiotensin receptor blocker (ARB) at discharge unless a contraindication is documented in the medical record.  This patient is not currently on an ACEI or ARB for HF.  This note is being placed in the record in order to provide documentation that a contraindication to the use of these agents is present for this encounter.  ACE Inhibitor or Angiotensin Receptor Blocker is contraindicated (specify all that apply)  []   ACEI allergy AND ARB allergy []   Angioedema []   Moderate or severe aortic stenosis []   Hyperkalemia [x]   Hypotension []   Renal artery stenosis []   Worsening renal function, preexisting renal disease or dysfunction   Georgina Peer 09/08/2015 12:22 PM

## 2015-09-08 NOTE — Progress Notes (Signed)
[place] Progress Note Patient Name: Patrick Sexton [facility] DOB: 09-03-1945 MRN: [id number]   Date of Service  09/08/2015  HPI/Events of Note  Pt on hep gtt, RN noted appearance of blood in foley  eICU Interventions  Check H/H and ptt     Intervention Category Major Interventions: Other:  Floyce Bujak 09/08/2015, 11:52 PM

## 2015-09-08 NOTE — Progress Notes (Signed)
At 1905 pt became very agitated, asynchronous with the vent, destating to the low to mid 80's, and converted to afib. RN noticed subglottic was suctioning tube feed, therefore tube feed was stopped. Oral suctioning also had tube feed. Respiratory called. Pt was bagged by respiratory until stats reached mid 90's. Elink notified and versed gtt ordered. RN will start versed if pt continues to remain agitated and asynchronous on vent. Will continue to monitor.

## 2015-09-08 NOTE — Progress Notes (Signed)
ANTICOAGULATION CONSULT NOTE - Follow Up Consult  Pharmacy Consult for Heparin  Indication: chest pain/ACS, s/p cath, s/p hypothermia  No Known Allergies  Patient Measurements: Height: 5\' 9"  (175.3 cm) Weight: 197 lb 8.2 oz (89.59 kg) IBW/kg (Calculated) : 70.7  Vital Signs: Temp: 98.8 F (37.1 C) (01/11 0400) Temp Source: Core (Comment) (01/11 0400) BP: 126/69 mmHg (01/11 0400) Pulse Rate: 35 (01/11 0404)  Labs:  Recent Labs  09/05/15 0620  09/05/15 1100  09/06/15 0525  09/07/15 0437 09/07/15 1200 09/08/15 0345  HGB  --   < >  --   < > 11.6*  --  10.5*  --  9.2*  HCT  --   < >  --   < > 35.3*  --  32.6*  --  29.3*  PLT  --   --   --   --  261  --  271  --  172  HEPARINUNFRC  --   --  0.28*  < >  --   < > 0.33 0.38 0.23*  CREATININE  --   < >  --   < > 1.18  --  1.42*  --  1.06  TROPONINI 64.90*  --  >65.00*  --   --   --   --   --   --   < > = values in this interval not displayed.  Estimated Creatinine Clearance: 72.8 mL/min (by C-G formula based on Cr of 1.06).   Assessment: 70 y/o M s/p cardiac arrest while shoveling snow, now s/p cath. Heparin started 6 hours post-sheath removal (~0020). Rewarming completed on 1/9. Heparin level down to 0.23 (subtherapeutic) on 1400 units/hr. Hgb down to 9.2, plt down to 172. No overt bleeding noted. No issues with line per RN.   Goal of Therapy:  Heparin level 0.3-0.7 units/ml Monitor platelets by anticoagulation protocol: Yes   Plan:  - Increase heparin to 1600 units/hr  - F/u heparin level in 6 hours - monitor for s/sx of bleeding  Sherlon Handing, PharmD, BCPS Clinical pharmacist, pager [phone] 09/08/2015,5:03 AM

## 2015-09-08 NOTE — Progress Notes (Addendum)
PULMONARY / CRITICAL CARE MEDICINE   Name: Patrick Sexton [facility] MRN: [id number] DOB: 11/11/1945    ADMISSION DATE:  09/09/2015  REFERRING MD:  EDP  CHIEF COMPLAINT:  Cardiac arrest   HISTORY OF PRESENT ILLNESS:   69 yo with hx CAD who was shoveling snow and went down. 15 min Vfib arrest. Cath no culprit, medical mgmt.   SUBJECTIVE:  Overnight, pt got agitated with desating to low 80s converted to afib.  Oral suctioning and subglottic with TF.  Pt bagged until sats came up.  Versed ordered.   UOP 1.0L yesterday  Cont on levo 4, amio, heparin gtt, and fentanyl 300   VITAL SIGNS: BP 109/65 mmHg  Pulse 46  Temp(Src) 98 F (36.7 C) (Oral)  Resp 24  Ht 5\' 9"  (1.753 m)  Wt 89.59 kg (197 lb 8.2 oz)  BMI 29.15 kg/m2  SpO2 93%  HEMODYNAMICS: CVP:  [13 mmHg-16 mmHg] 13 mmHg  VENTILATOR SETTINGS: Vent Mode:  [-] PRVC FiO2 (%):  [50 %-70 %] 50 % Set Rate:  [22 bmp] 22 bmp Vt Set:  [550 mL] 550 mL PEEP:  [5 cmH20] 5 cmH20 Pressure Support:  [15 cmH20] 15 cmH20 Plateau Pressure:  [25 cmH20-32 cmH20] 25 cmH20  INTAKE / OUTPUT: I/O last 3 completed shifts: In: 4063.1 [I.V.:2212.1; NG/GT:1151; IV [id number] Out: 2425 [Urine:2425]  PHYSICAL EXAMINATION: General: acutely ill , sedated, rass -2, moves all ext HEENT:PERL, followed commands yeah!!!!! Cardiovascular:  irregular rhythm, nml rate  Lungs: ronchi, thick secretions Abdomen:  Soft + bs, no r/g Musculoskeletal: intact Skin: cool  LABS:  BMET  Recent Labs Lab 09/06/15 0525 09/07/15 0437 09/08/15 0345  NA 136 135 131*  K 3.3* 4.3 3.8  CL 100* 99* 99*  CO2 23 24 24   BUN 26* 25* 32*  CREATININE 1.18 1.42* 1.06  GLUCOSE 143* 123* 121*    Electrolytes  Recent Labs Lab 09/05/2015 1845  09/05/15 0424  09/06/15 0525 09/07/15 0437 09/07/15 0830 09/08/15 0345  CALCIUM  --   < > 7.7*  < > 6.9* 6.8*  --  7.0*  MG 1.8  --  2.5*  --  1.6*  --  2.2  --   PHOS 5.9*  --   --   --  3.4  --   --   --   < > = values in  this interval not displayed.  CBC  Recent Labs Lab 09/06/15 0525 09/07/15 0437 09/08/15 0345  WBC 12.1* 21.1* 13.2*  HGB 11.6* 10.5* 9.2*  HCT 35.3* 32.6* 29.3*  PLT 261 271 172    Coag's  Recent Labs Lab 09/05/2015 1845 09/05/15 0020 09/05/15 0424  APTT 156* 34 44*  INR 1.65* 1.49 1.50*    Sepsis Markers  Recent Labs Lab 09/16/2015 1523 09/02/2015 1651 08/29/2015 1845 09/11/2015 2341  LATICACIDVEN 7.63* 3.5*  --  4.8*  PROCALCITON  --   --  0.29  --     ABG  Recent Labs Lab 09/05/15 0638 09/07/15 0355 09/07/15 0631  PHART 7.341* 7.491* 7.439  PCO2ART 27.8* 30.2* 35.6  PO2ART 73.0* 118* 80.0    Liver Enzymes  Recent Labs Lab 09/06/15 1009 09/07/15 0437 09/08/15 0345  AST 232* 161* 98*  ALT 172* 153* 113*  ALKPHOS 63 71 114  BILITOT 1.0 2.2* 1.7*  ALBUMIN 2.0* 2.2* 2.1*    Cardiac Enzymes  Recent Labs Lab 09/05/15 0424 09/05/15 0620 09/05/15 1100  TROPONINI 60.18* 64.90* >65.00*    Glucose  Recent Labs Lab 09/07/15  [id number] 09/07/15 1112 09/07/15 1609 09/07/15 2048 09/07/15 2313 09/08/15 0345  GLUCAP 130* 131* 115* 127* 102* 120*    Imaging Dg Chest Port 1 View  09/08/2015  CLINICAL DATA:  Aspiration pneumonia, ventricular fibrillation, cardiac arrest, acute respiratory failure. EXAM: PORTABLE CHEST 1 VIEW COMPARISON:  Portable chest x-ray dated September 07, 2015 FINDINGS: The lungs are adequately inflated. Diffuse interstitial and alveolar opacities persist bilaterally. The left hemidiaphragm remains obscured. The cardiac silhouette remains enlarged. The pulmonary vascularity is indistinct. There is no pleural effusion. The endotracheal tube tip lies approximately 4.8 cm above the carina. The esophagogastric tube tip projects below the inferior margin of the image. The left internal jugular venous catheter tip projects over the midportion of the SVC. Post CABG changes are present and stable. IMPRESSION: Persistent widespread alveolar opacities  compatible with pulmonary edema or pneumonia. Stable mild enlargement of the cardiac silhouette. The support tubes are in reasonable position. Electronically Signed   By: David  Martinique M.D.   On: 09/08/2015 06:49     STUDIES:  CT head 1/7>> mild atrophy LHC 1/7>> no intervention but severe LV dysfunction and severe native and graft disease CXR 1/9>> EEG 1/9>>Abnormal with moderate generalized nonspecific continuous slowing of cerebral activity, no epileptic disorder  CULTURES: 1/7 MRSA>>NEG 1/7 Urine Cx>>NEG 1/7 Blood Cx>> 1/7 Tracheal Cx>>?  ANTIBIOTICS: Vancomycin 1/9>> Zosyn 1/9>>  SIGNIFICANT EVENTS: 1/7cardiac arrest 1/8- shock 1/9- rewarm 1/10 follows commands  LINES/TUBES: 1/7 et>> 1/7 left IJ>>> 1/7 rt rad a line>>>  DISCUSSION: 37  yom with cad post arrest  ASSESSMENT / PLAN:  PULMONARY A: VDRF post arrest Suspected aspiration, has patchy infiltrates De [place] overnight, desaturation, ATX, mucous plug  P:   Vent bundle Weaning ps 15 needed Lasix time, neg balance goals needed To goal 50%, if unable then peep to 8 Control agitation Add mucomysts, chest pt, may require bronch   CARDIOVASCULAR A:  Post cardiac arrest while shoveling snow. 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 without clear culprit for infarct h/o CABG  CHF Shock (cardiogenic, r/o septic / sirs) Rel AI New fib 1/11 P:  Asa Heparin drip for fib, no longer needed for cad Levophed to map goal change 60 with EF noted Cont stress steroids  amio drip, dc as brady, consider bid dosing   RENAL A:   AKI Metab acidosis resolved ARF resolved Hyponatremia Pos balance daily P:   Chem in am  Add lasix  Pressors related to sedation  GASTROINTESTINAL A:  Elevated transaminases - likely d/t shock liver  GI protection P:   PPI lft TFs  HEMATOLOGIC A:   Anticoagulation per cardiology  leukocytosis P:  Hep levels  Cbc in am   INFECTIOUS A:    Aspiration P:   1/7 unasyn>>>1/9 vanc 1/9>>>1/11 Zosyn 1/9>>>1/11 unasyn 1/11>>>  Narrow as above  ENDOCRINE A:   Hyperglycemia Sick euthyroid P:   Insulin ssi Treat illness, repeat thyroid fxn in 3 months  NEUROLOGIC A:   Currently sedated post intubation following cardiac arrest while shoveling snow. At risk anoxia - EEG abnormal showing moderate generalized nonspecific slowing  P:   Would wean fentanyl further  RASS goal: 0  [name], MD Internal Medicine, PGY-3 09/08/2015 8:35 AM  STAFF NOTE: Linwood Dibbles, MD FACP have personally reviewed patient's available data, including medical history, events of note, physical examination and test results as part of my evaluation. I have discussed with resident/NP and other care providers such  as pharmacist, RN and RRT. In addition, I personally evaluated patient and elicited key findings of: agitation overnight, ronchi, desaturated overnight requiring higher flow O2, concern is ATX / mucous, add mucomysts, chest pt, re asses pcxr in am , if desat today = bronch, if able to p50% then cpap ps wenaing today, unlikely to extubate, reduce rate vent, abg reviewed from day prior, afib new, keep heparin, amio reduce as brady, appears to have sick eutyroid, repeat levels in months, narrow abx to ceftriaxone, total 7 days, keep plat elss 30  The patient is critically ill with multiple organ systems failure and requires high complexity decision making for assessment and support, frequent evaluation and titration of therapies, application of advanced monitoring technologies and extensive interpretation of multiple databases.   Critical Care Time devoted to patient care services described in this note is 30 Minutes. This time reflects time of care of this signee: Merrie Roof, MD FACP. This critical care time does not reflect procedure time, or teaching time or supervisory time of PA/NP/Med student/Med Resident etc but could involve  care discussion time. Rest per NP/medical resident whose note is outlined above and that I agree with   Lavon Paganini. Titus Mould, MD, FACP Pgr: [facility] 09/08/2015 9:23 AM    I have had extensive discussions with family wife. We discussed patients current circumstances and organ failures. We also discussed patient's prior wishes under circumstances such as this. Family has decided to NOT perform resuscitation if arrest but to continue current medical support for now.  totoal ccm time now 85 min   Lavon Paganini. Titus Mould, MD, [place] Pgr: [facility]

## 2015-09-08 NOTE — Progress Notes (Signed)
Pharmacy Antibiotic Follow-up Note  Patrick Sexton [facility] is a 70 y.o. year-old male admitted on 09/13/2015 s/p cardiac arrest.  The patient is currently on day 3 of vancomycin and Zosyn for potential aspiration pneumonia.  Assessment/Plan: After discussion with Dr. Titus Mould, the current antibiotic(s) vancomycin and Zosyn will be narrowed to Unasyn and continued for 7 total days.  The stop date is entered and will be 09/12/15.  - WBC improving, afebrile, LA and PCT improved - start Unasyn 3 g q6h to complete 7 day course of abx  Temp (24hrs), Avg:98.6 F (37 C), Min:97.9 F (36.6 C), Max:99.1 F (37.3 C)   Recent Labs Lab 09/14/2015 1845 09/05/15 0424 09/06/15 0525 09/07/15 0437 09/08/15 0345  WBC 17.9* 10.5 12.1* 21.1* 13.2*    Recent Labs Lab 09/05/15 2125 09/06/15 0127 09/06/15 0525 09/07/15 0437 09/08/15 0345  CREATININE 1.20 1.16 1.18 1.42* 1.06   Estimated Creatinine Clearance: 72.8 mL/min (by C-G formula based on Cr of 1.06).    No Known Allergies  Antimicrobials this admission: Unasyn 1/8 >> 1/9 Vancomycin 1/9 >> 1/11 Zosyn 1/9 >> 1/11 Unasyn 1/11 >> (stop entered for 1/15)  Levels/dose changes this admission: none  Microbiology results: 1/7 MRSA PCR negative 1/7 BCx x2: NG x4D 1/7 UCx: NG  Thank you for allowing pharmacy to be a part of this patient's care.  Governor Specking, PharmD Clinical Pharmacy Resident Pager: [phone] 09/08/2015 12:02 PM

## 2015-09-08 NOTE — Progress Notes (Signed)
RN called, MD changed vent rate to 18.

## 2015-09-08 NOTE — Progress Notes (Signed)
ANTICOAGULATION CONSULT NOTE - Follow Up Consult  Pharmacy Consult for Heparin  Indication: atrial fibrillation  No Known Allergies  Patient Measurements: Height: 5\' 9"  (175.3 cm) Weight: 197 lb 8.2 oz (89.59 kg) IBW/kg (Calculated) : 70.7  Vital Signs: Temp: 96.2 F (35.7 C) (01/11 1221) Temp Source: Axillary (01/11 1221) BP: 100/64 mmHg (01/11 1200) Pulse Rate: 69 (01/11 1200)  Labs:  Recent Labs  09/06/15 0525  09/07/15 0437 09/07/15 1200 09/08/15 0345 09/08/15 1227  HGB 11.6*  --  10.5*  --  9.2*  --   HCT 35.3*  --  32.6*  --  29.3*  --   PLT 261  --  271  --  172  --   HEPARINUNFRC  --   < > 0.33 0.38 0.23* 0.43  CREATININE 1.18  --  1.42*  --  1.06  --   < > = values in this interval not displayed.  Estimated Creatinine Clearance: 72.8 mL/min (by C-G formula based on Cr of 1.06).   Assessment: 70 y/o M s/p cardiac arrest while shoveling snow, now s/p cath. Per cardiology, heparin is no longer needed for medical management, but pt is now having pAF. CCM continuing heparin for now pending transition to oral anticoagulation.   HL was subtherapeutic at 0.23 (subtherapeutic) this morning on 1400 units/hr, rate increased to 1600 units/hr.  Hgb down to 9.2, plt down to 172. No overt bleeding noted. No issues with line per RN.   HL now therapeutic at 0.43 on 1600 units/hr.  Goal of Therapy:  Heparin level 0.3-0.7 units/ml Monitor platelets by anticoagulation protocol: Yes   Plan:  - continue heparin at 1600 units/hr  - Daily HL, CBC - monitor for s/sx of bleeding - f/u transition to oral AC when more stable  Governor Specking, PharmD Clinical Pharmacy Resident Pager: [phone]  09/08/2015,12:56 PM

## 2015-09-09 ENCOUNTER — Inpatient Hospital Stay (HOSPITAL_COMMUNITY): Payer: Medicare Other

## 2015-09-09 LAB — GLUCOSE, CAPILLARY
GLUCOSE-CAPILLARY: 100 mg/dL — AB (ref 65–99)
GLUCOSE-CAPILLARY: 95 mg/dL (ref 65–99)
GLUCOSE-CAPILLARY: 97 mg/dL (ref 65–99)
GLUCOSE-CAPILLARY: 99 mg/dL (ref 65–99)
Glucose-Capillary: 100 mg/dL — ABNORMAL HIGH (ref 65–99)
Glucose-Capillary: 102 mg/dL — ABNORMAL HIGH (ref 65–99)

## 2015-09-09 LAB — HEPARIN LEVEL (UNFRACTIONATED): HEPARIN UNFRACTIONATED: 0.44 [IU]/mL (ref 0.30–0.70)

## 2015-09-09 LAB — BASIC METABOLIC PANEL
Anion gap: 10 (ref 5–15)
BUN: 33 mg/dL — AB (ref 6–20)
CALCIUM: 7.1 mg/dL — AB (ref 8.9–10.3)
CO2: 26 mmol/L (ref 22–32)
CREATININE: 1.14 mg/dL (ref 0.61–1.24)
Chloride: 102 mmol/L (ref 101–111)
GFR calc Af Amer: >60 mL/min (ref >60)
GLUCOSE: 91 mg/dL (ref 65–99)
Potassium: 3.4 mmol/L — ABNORMAL LOW (ref 3.5–5.1)
Sodium: 138 mmol/L (ref 135–145)

## 2015-09-09 LAB — CULTURE, BLOOD (ROUTINE X 2)
CULTURE: NO GROWTH
Culture: NO GROWTH

## 2015-09-09 LAB — CBC WITH DIFFERENTIAL/PLATELET
BASOS PCT: 0 %
Basophils Absolute: 0 10*3/uL (ref 0.0–0.1)
EOS ABS: 0 10*3/uL (ref 0.0–0.7)
Eosinophils Relative: 0 %
HCT: 26.2 % — ABNORMAL LOW (ref 39.0–52.0)
HEMOGLOBIN: 8.4 g/dL — AB (ref 13.0–17.0)
LYMPHS ABS: 0.6 10*3/uL — AB (ref 0.7–4.0)
Lymphocytes Relative: 5 %
MCH: 24.9 pg — AB (ref 26.0–34.0)
MCHC: 32.1 g/dL (ref 30.0–36.0)
MCV: 77.5 fL — ABNORMAL LOW (ref 78.0–100.0)
MONO ABS: 0.7 10*3/uL (ref 0.1–1.0)
MONOS PCT: 5 %
NEUTROS PCT: 90 %
Neutro Abs: 11.8 10*3/uL — ABNORMAL HIGH (ref 1.7–7.7)
Platelets: 168 10*3/uL (ref 150–400)
RBC: 3.38 MIL/uL — ABNORMAL LOW (ref 4.22–5.81)
RDW: 16 % — AB (ref 11.5–15.5)
WBC: 13.1 10*3/uL — ABNORMAL HIGH (ref 4.0–10.5)

## 2015-09-09 LAB — COMPREHENSIVE METABOLIC PANEL
ALBUMIN: 2.1 g/dL — AB (ref 3.5–5.0)
ALK PHOS: 137 U/L — AB (ref 38–126)
ALT: 83 U/L — AB (ref 17–63)
AST: 61 U/L — AB (ref 15–41)
Anion gap: 8 (ref 5–15)
BUN: 33 mg/dL — AB (ref 6–20)
CALCIUM: 7.1 mg/dL — AB (ref 8.9–10.3)
CO2: 27 mmol/L (ref 22–32)
CREATININE: 1.23 mg/dL (ref 0.61–1.24)
Chloride: 100 mmol/L — ABNORMAL LOW (ref 101–111)
GFR calc Af Amer: >60 mL/min (ref >60)
GFR calc non Af Amer: 58 mL/min — ABNORMAL LOW (ref >60)
GLUCOSE: 111 mg/dL — AB (ref 65–99)
Potassium: 4 mmol/L (ref 3.5–5.1)
SODIUM: 135 mmol/L (ref 135–145)
Total Bilirubin: 1.4 mg/dL — ABNORMAL HIGH (ref 0.3–1.2)
Total Protein: 5 g/dL — ABNORMAL LOW (ref 6.5–8.1)

## 2015-09-09 LAB — TRIGLYCERIDES: Triglycerides: 98 mg/dL (ref <150)

## 2015-09-09 LAB — APTT: aPTT: 115 seconds — ABNORMAL HIGH (ref 24–37)

## 2015-09-09 LAB — HEMOGLOBIN AND HEMATOCRIT, BLOOD
HEMATOCRIT: 26.8 % — AB (ref 39.0–52.0)
HEMOGLOBIN: 8.6 g/dL — AB (ref 13.0–17.0)

## 2015-09-09 LAB — PHOSPHORUS: Phosphorus: 2.7 mg/dL (ref 2.5–4.6)

## 2015-09-09 LAB — MAGNESIUM: Magnesium: 2.1 mg/dL (ref 1.7–2.4)

## 2015-09-09 MED ORDER — PROPOFOL 1000 MG/100ML IV EMUL
0.0000 ug/kg/min | INTRAVENOUS | Status: DC
  Administered 2015-09-10: 5 ug/kg/min via INTRAVENOUS
  Administered 2015-09-10: 30 ug/kg/min via INTRAVENOUS
  Administered 2015-09-11: 5 ug/kg/min via INTRAVENOUS
  Filled 2015-09-09 (×4): qty 100

## 2015-09-09 MED ORDER — FUROSEMIDE 10 MG/ML IJ SOLN
8.0000 mg/h | INTRAVENOUS | Status: DC
  Administered 2015-09-09: 8 mg/h via INTRAVENOUS
  Filled 2015-09-09 (×2): qty 25

## 2015-09-09 MED ORDER — METOCLOPRAMIDE HCL 5 MG/ML IJ SOLN
5.0000 mg | Freq: Four times a day (QID) | INTRAMUSCULAR | Status: DC
  Administered 2015-09-09 – 2015-09-21 (×47): 5 mg via INTRAVENOUS
  Filled 2015-09-09 (×48): qty 2

## 2015-09-09 NOTE — Progress Notes (Signed)
PULMONARY / CRITICAL CARE MEDICINE   Name: Patrick Sexton [facility] MRN: [id number] DOB: Aug 05, 1946    ADMISSION DATE:  09/23/2015  REFERRING MD:  EDP  CHIEF COMPLAINT:  Cardiac arrest   HISTORY OF PRESENT ILLNESS:   70 yo with hx CAD who was shoveling snow and went down. 15 min Vfib arrest. Cath no culprit, medical mgmt.   SUBJECTIVE:  Blood in urine dnr established Int brady  VITAL SIGNS: BP 106/67 mmHg  Pulse 63  Temp(Src) 98.1 F (36.7 C) (Oral)  Resp 18  Ht '5\' 9"'$  (1.753 m)  Wt 90 kg (198 lb 6.6 oz)  BMI 29.29 kg/m2  SpO2 100%  HEMODYNAMICS: CVP:  [12 mmHg-14 mmHg] 14 mmHg  VENTILATOR SETTINGS: Vent Mode:  [-] PRVC FiO2 (%):  [50 %-60 %] 50 % Set Rate:  [18 bmp] 18 bmp Vt Set:  [550 mL] 550 mL PEEP:  [5 cmH20] 5 cmH20 Plateau Pressure:  [17 cmH20-28 cmH20] 28 cmH20  INTAKE / OUTPUT: I/O last 3 completed shifts: In: 1552.2 [I.V.:952.2; NG/GT:50; IV Piggyback:550] Out: 2035 [Urine:2035]  PHYSICAL EXAMINATION: General: acutely ill , sedated rass -1, moves all EXt eqaully , toes, hands, fingers  HEENT:PERL, followed commands further Cardiovascular:  irregular rhythm, nml rate  Lungs: ronchi, secretions low Abdomen:  Soft + bs, no r/g Musculoskeletal: intact Skin: cool  LABS:  BMET  Recent Labs Lab 09/07/15 0437 09/08/15 0345 09/09/15 0230  NA 135 131* 135  K 4.3 3.8 4.0  CL 99* 99* 100*  CO2 '24 24 27  '$ BUN 25* 32* 33*  CREATININE 1.42* 1.06 1.23  GLUCOSE 123* 121* 111*    Electrolytes  Recent Labs Lab 09/26/2015 1845  09/05/15 0424  09/06/15 0525 09/07/15 0437 09/07/15 0830 09/08/15 0345 09/09/15 0230  CALCIUM  --   < > 7.7*  < > 6.9* 6.8*  --  7.0* 7.1*  MG 1.8  --  2.5*  --  1.6*  --  2.2  --   --   PHOS 5.9*  --   --   --  3.4  --   --   --   --   < > = values in this interval not displayed.  CBC  Recent Labs Lab 09/07/15 0437 09/08/15 0345 09/09/15 0016 09/09/15 0230  WBC 21.1* 13.2*  --  13.1*  HGB 10.5* 9.2* 8.6* 8.4*  HCT 32.6*  29.3* 26.8* 26.2*  PLT 271 172  --  168    Coag's  Recent Labs Lab 09/22/2015 1845 09/05/15 0020 09/05/15 0424 09/09/15 0016  APTT 156* 34 44* 115*  INR 1.65* 1.49 1.50*  --     Sepsis Markers  Recent Labs Lab 09/20/2015 1523 09/24/2015 1651 08/30/2015 1845 09/20/2015 2341  LATICACIDVEN 7.63* 3.5*  --  4.8*  PROCALCITON  --   --  0.29  --     ABG  Recent Labs Lab 09/05/15 0638 09/07/15 0355 09/07/15 0631  PHART 7.341* 7.491* 7.439  PCO2ART 27.8* 30.2* 35.6  PO2ART 73.0* 118* 80.0    Liver Enzymes  Recent Labs Lab 09/07/15 0437 09/08/15 0345 09/09/15 0230  AST 161* 98* 61*  ALT 153* 113* 83*  ALKPHOS 71 114 137*  BILITOT 2.2* 1.7* 1.4*  ALBUMIN 2.2* 2.1* 2.1*    Cardiac Enzymes  Recent Labs Lab 09/05/15 0424 09/05/15 0620 09/05/15 1100  TROPONINI 60.18* 64.90* >65.00*    Glucose  Recent Labs Lab 09/08/15 0734 09/08/15 1227 09/08/15 1816 09/08/15 1941 09/08/15 2351 09/09/15 0444  GLUCAP 104* 124*  100* 116* 100* 99    Imaging Dg Chest Port 1 View  09/09/2015  CLINICAL DATA:  Pneumonia. EXAM: PORTABLE CHEST 1 VIEW COMPARISON:  09/08/2015. FINDINGS: Endotracheal tube, NG tube, left IJ line stable position. Prior CABG. Cardiomegaly with diffuse bilateral pulmonary infiltrates, improved from prior exam. Small left pleural effusion. No pneumothorax. IMPRESSION: 1.  Lines and tubes in stable position. 2. Prior CABG. Cardiomegaly with diffuse bilateral pulmonary infiltrates, slightly improved from prior exam suggesting slight improvement of congestive heart failure. Bilateral pneumonia cannot be excluded. Small left pleural effusion. Electronically Signed   By: Marcello Moores  Register   On: 09/09/2015 07:19     STUDIES:  CT head 1/7>> mild atrophy LHC 1/7>> no intervention but severe LV dysfunction and severe native and graft disease CXR 1/9>> EEG 1/9>>Abnormal with moderate generalized nonspecific continuous slowing of cerebral activity, no epileptic  disorder  CULTURES: 1/7 MRSA>>NEG 1/7 Urine Cx>>NEG 1/7 Blood Cx>> 1/7 Tracheal Cx>>?  ANTIBIOTICS: Vancomycin 1/9>>1/11 Zosyn 1/9>>1/11 unasyn 1/11>>>stop 15th  SIGNIFICANT EVENTS: 1/7cardiac arrest 1/8- shock 1/9- rewarm 1/10 follows commands 1/11- dnr established  LINES/TUBES: 1/7 et>> 1/7 left IJ>>> 1/7 rt rad a line>>>1/12  DISCUSSION: 48  yom with cad post arrest  ASSESSMENT / PLAN:  PULMONARY A: VDRF post arrest Suspected aspiration, has patchy infiltrates De recruit overnight, desaturation, ATX, mucous plug  P:   pcxr slight improved Weaning today 5/5, goal 2 hr Will need to discuss reintubation status also Chest pt Was pos again, l asix needed increase, consider drip  CARDIOVASCULAR A:  Post cardiac arrest while shoveling snow. 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 without clear culprit for infarct h/o CABG  CHF Shock (cardiogenic, r/o septic / sirs) Rel AI New fib 1/11 P:  Asa Heparin drip for fib Levophed to map goal 55, with good MS, goal to dc Cont stress steroids, keep until off pressors 24 hrs  amio dc all together as brady increased  Lasix drip with borderline hemodynamics  plavix if fCAD concerned inhibition weaning  RENAL A:   AKI Metab acidosis resolved ARF resolved Hyponatremia Pos balance daily P:   Chem in am  Lasix drip, goal neg 2 liters  GASTROINTESTINAL A:  Elevated transaminases - likely d/t shock liver  GI protection P:   PPI TFs reglan add , then trickle restart  Re assess alk phos has not resolved   HEMATOLOGIC A:   Anticoagulation per cardiology  Leukocytosis Hematuria, trauma? P:  Hep levels  Cbc in am May need lower hep level goals  May need to hold  INFECTIOUS A:   Aspiration PNA P:   1/7 unasyn>>>1/9 vanc 1/9>>>1/11 Zosyn 1/9>>>1/11 unasyn 1/11>>> Stop date in place  Narrow as above  ENDOCRINE A:   Hyperglycemia Sick euthyroid P:   Insulin  ssi  NEUROLOGIC A:   Currently sedated post intubation following cardiac arrest while shoveling snow. At risk anoxia - EEG abnormal showing moderate generalized nonspecific slowing  P:   Would wean fentanyl further  RASS goal: 0 Want to avoid versed Dc versed drip again fent with WUA If neede and off pressors add prop  Ccm time 30 min   Lavon Paganini. Titus Mould, MD, [place] Pgr: [place] [facility]

## 2015-09-09 NOTE — Progress Notes (Signed)
[place] Progress Note Patient Name: Patrick Sexton [facility] DOB: 1946-03-28 MRN: [id number]   Date of Service  09/09/2015  HPI/Events of Note  Ptt=115 Hb=8.6  eICU Interventions  Adjust hep gtt, monitor Hb     Intervention Category Major Interventions: Other:  Skyanne Welle 09/09/2015, 1:06 AM

## 2015-09-09 NOTE — Progress Notes (Signed)
RT attempted wean and patient HR was 26 beats/min. Wean terminated and placed back on full support. RN aware, MD aware, and patient comfortable at this time. HR 60's at this time. RT will continue to monitor patient.

## 2015-09-09 NOTE — Progress Notes (Signed)
CPT terminated due to patient agitation and HR decreasing to low-mid 40s. Patient HR increased to 60's once therapy was terminated. RT will continue to monitor patient.

## 2015-09-09 NOTE — Progress Notes (Signed)
ANTICOAGULATION CONSULT NOTE - Follow Up Consult  Pharmacy Consult for Heparin  Indication: atrial fibrillation  No Known Allergies  Patient Measurements: Height: 5\' 9"  (175.3 cm) Weight: 198 lb 6.6 oz (90 kg) IBW/kg (Calculated) : 70.7  Vital Signs: Temp: 98.5 F (36.9 C) (01/12 0440) Temp Source: Axillary (01/12 0440) BP: 102/66 mmHg (01/12 0600) Pulse Rate: 62 (01/12 0600)  Labs:  Recent Labs  09/07/15 0437  09/08/15 0345 09/08/15 1227 09/09/15 0016 09/09/15 0230 09/09/15 0238  HGB 10.5*  --  9.2*  --  8.6* 8.4*  --   HCT 32.6*  --  29.3*  --  26.8* 26.2*  --   PLT 271  --  172  --   --  168  --   APTT  --   --   --   --  115*  --   --   HEPARINUNFRC 0.33  < > 0.23* 0.43  --   --  0.44  CREATININE 1.42*  --  1.06  --   --  1.23  --   < > = values in this interval not displayed.  Estimated Creatinine Clearance: 62.9 mL/min (by C-G formula based on Cr of 1.23).   Assessment: 70 y/o M s/p cardiac arrest while shoveling snow, now s/p cath. Per cardiology, heparin is no longer needed for medical management, but pt is now having pAF. CCM continuing heparin for now pending transition to oral anticoagulation.   HL remains therapeutic at 0.44 on 1600 units/hr. Pt with some bleeding from penis and into foley overnight. Hgb 9.2 > 8.4, Plt 172 > 168.   Goal of Therapy:  Heparin level 0.3-0.7 units/ml Monitor platelets by anticoagulation protocol: Yes   Plan:  - will reduce rate to 1550 units/hr given hematuria; Dr. Titus Mould wants to target lower heparin level - Daily HL, CBC - monitor for s/sx of bleeding - f/u transition to oral AC when more stable  Governor Specking, PharmD Clinical Pharmacy Resident Pager: [phone]  09/09/2015,7:40 AM

## 2015-09-09 NOTE — Progress Notes (Signed)
RN noted blood in the foley at 2000. RN noted the foley appeared to be pulling on pt's penis, and RN replaced the foley anchor on pt. At 2200 the urine was yellow. At 0000 the urine became red tinged again and RN noted pt's penis was also bloody. RN called Elink MD and a H&H and APTT were ordered. At [place] called Elink with lab results and MD said to consult with pharmacy on hepain gtt. Pharmacy said to wait until AM heparin lab results are back to make further decision. Will continue to monitor.

## 2015-09-10 ENCOUNTER — Inpatient Hospital Stay (HOSPITAL_COMMUNITY): Payer: Medicare Other

## 2015-09-10 LAB — POCT I-STAT 3, ART BLOOD GAS (G3+)
Acid-Base Excess: 4 mmol/L — ABNORMAL HIGH (ref 0.0–2.0)
Bicarbonate: 26.8 mEq/L — ABNORMAL HIGH (ref 20.0–24.0)
O2 SAT: 93 %
PCO2 ART: 34 mmHg — AB (ref 35.0–45.0)
PO2 ART: 59 mmHg — AB (ref 80.0–100.0)
Patient temperature: 98.5
TCO2: 28 mmol/L (ref 0–100)
pH, Arterial: 7.504 — ABNORMAL HIGH (ref 7.350–7.450)

## 2015-09-10 LAB — BASIC METABOLIC PANEL
ANION GAP: 10 (ref 5–15)
Anion gap: 11 (ref 5–15)
BUN: 35 mg/dL — AB (ref 6–20)
BUN: 37 mg/dL — ABNORMAL HIGH (ref 6–20)
CALCIUM: 6.9 mg/dL — AB (ref 8.9–10.3)
CALCIUM: 6.9 mg/dL — AB (ref 8.9–10.3)
CHLORIDE: 106 mmol/L (ref 101–111)
CO2: 26 mmol/L (ref 22–32)
CO2: 29 mmol/L (ref 22–32)
CREATININE: 1.38 mg/dL — AB (ref 0.61–1.24)
CREATININE: 1.23 mg/dL (ref 0.61–1.24)
Chloride: 103 mmol/L (ref 101–111)
GFR calc Af Amer: 59 mL/min — ABNORMAL LOW (ref >60)
GFR calc Af Amer: >60 mL/min (ref >60)
GFR calc non Af Amer: 58 mL/min — ABNORMAL LOW (ref >60)
GFR, EST NON AFRICAN AMERICAN: 51 mL/min — AB (ref >60)
GLUCOSE: 95 mg/dL (ref 65–99)
GLUCOSE: 104 mg/dL — AB (ref 65–99)
Potassium: 3.2 mmol/L — ABNORMAL LOW (ref 3.5–5.1)
Potassium: 3.3 mmol/L — ABNORMAL LOW (ref 3.5–5.1)
SODIUM: 142 mmol/L (ref 135–145)
Sodium: 143 mmol/L (ref 135–145)

## 2015-09-10 LAB — PHOSPHORUS
Phosphorus: 2.7 mg/dL (ref 2.5–4.6)
Phosphorus: 2.8 mg/dL (ref 2.5–4.6)

## 2015-09-10 LAB — CBC
HCT: 26.3 % — ABNORMAL LOW (ref 39.0–52.0)
Hemoglobin: 8.3 g/dL — ABNORMAL LOW (ref 13.0–17.0)
MCH: 25 pg — AB (ref 26.0–34.0)
MCHC: 31.6 g/dL (ref 30.0–36.0)
MCV: 79.2 fL (ref 78.0–100.0)
PLATELETS: 159 10*3/uL (ref 150–400)
RBC: 3.32 MIL/uL — ABNORMAL LOW (ref 4.22–5.81)
RDW: 16.2 % — AB (ref 11.5–15.5)
WBC: 11.8 10*3/uL — ABNORMAL HIGH (ref 4.0–10.5)

## 2015-09-10 LAB — GLUCOSE, CAPILLARY
GLUCOSE-CAPILLARY: 131 mg/dL — AB (ref 65–99)
GLUCOSE-CAPILLARY: 169 mg/dL — AB (ref 65–99)
GLUCOSE-CAPILLARY: 94 mg/dL (ref 65–99)
GLUCOSE-CAPILLARY: 99 mg/dL (ref 65–99)
Glucose-Capillary: 104 mg/dL — ABNORMAL HIGH (ref 65–99)
Glucose-Capillary: 106 mg/dL — ABNORMAL HIGH (ref 65–99)
Glucose-Capillary: 145 mg/dL — ABNORMAL HIGH (ref 65–99)

## 2015-09-10 LAB — HEPARIN LEVEL (UNFRACTIONATED): Heparin Unfractionated: 0.44 IU/mL (ref 0.30–0.70)

## 2015-09-10 LAB — MAGNESIUM
Magnesium: 1.9 mg/dL (ref 1.7–2.4)
Magnesium: 1.9 mg/dL (ref 1.7–2.4)

## 2015-09-10 MED ORDER — POTASSIUM CHLORIDE 10 MEQ/50ML IV SOLN
10.0000 meq | INTRAVENOUS | Status: AC
  Administered 2015-09-10 (×2): 10 meq via INTRAVENOUS
  Filled 2015-09-10: qty 50

## 2015-09-10 MED ORDER — CLOPIDOGREL BISULFATE 300 MG PO TABS
300.0000 mg | ORAL_TABLET | Freq: Once | ORAL | Status: AC
  Administered 2015-09-10: 300 mg via ORAL
  Filled 2015-09-10: qty 1

## 2015-09-10 MED ORDER — MAGNESIUM SULFATE 2 GM/50ML IV SOLN
2.0000 g | Freq: Once | INTRAVENOUS | Status: AC
  Administered 2015-09-10: 2 g via INTRAVENOUS
  Filled 2015-09-10: qty 50

## 2015-09-10 MED ORDER — VITAL AF 1.2 CAL PO LIQD
1000.0000 mL | ORAL | Status: DC
  Administered 2015-09-10 – 2015-09-13 (×4): 1000 mL
  Filled 2015-09-10: qty 1000

## 2015-09-10 MED ORDER — CLOPIDOGREL BISULFATE 75 MG PO TABS
75.0000 mg | ORAL_TABLET | Freq: Every day | ORAL | Status: DC
  Administered 2015-09-11 – 2015-09-15 (×5): 75 mg via ORAL
  Filled 2015-09-10 (×5): qty 1

## 2015-09-10 MED ORDER — FUROSEMIDE 10 MG/ML IJ SOLN
120.0000 mg | Freq: Three times a day (TID) | INTRAVENOUS | Status: DC
  Administered 2015-09-10 – 2015-09-11 (×5): 120 mg via INTRAVENOUS
  Filled 2015-09-10 (×9): qty 12

## 2015-09-10 MED ORDER — FENTANYL BOLUS VIA INFUSION
25.0000 ug | INTRAVENOUS | Status: DC | PRN
  Administered 2015-09-13 – 2015-09-19 (×4): 50 ug via INTRAVENOUS
  Filled 2015-09-10: qty 75

## 2015-09-10 MED ORDER — POTASSIUM CHLORIDE 20 MEQ/15ML (10%) PO SOLN
40.0000 meq | ORAL | Status: AC
  Administered 2015-09-10 (×2): 40 meq
  Filled 2015-09-10 (×2): qty 30

## 2015-09-10 NOTE — Progress Notes (Signed)
[place] Progress Note Patient Name: Patrick Sexton [facility] DOB: 10-20-1945 MRN: [id number]   Date of Service  09/10/2015  HPI/Events of Note  Hypokalemia  eICU Interventions  Potassium replaced     Intervention Category Minor Interventions: Electrolytes abnormality - evaluation and management  Patrick Sexton 09/10/2015, 2:22 AM

## 2015-09-10 NOTE — Progress Notes (Signed)
PULMONARY / CRITICAL CARE MEDICINE   Name: Patrick Sexton [facility] MRN: [id number] DOB: [birth year]    ADMISSION DATE:  09/02/2015  REFERRING MD:  EDP  CHIEF COMPLAINT:  Cardiac arrest   HISTORY OF PRESENT ILLNESS:   70 yo with hx CAD who was shoveling snow and went down. 15 min Vfib arrest. Cath no culprit, medical mgmt.   SUBJECTIVE:  Remains with some bloody urine Poor weaning this am again  VITAL SIGNS: BP 104/66 mmHg  Pulse 101  Temp(Src) 99.2 F (37.3 C) (Axillary)  Resp 30  Ht 5\' 9"  (1.753 m)  Wt 88.1 kg (194 lb 3.6 oz)  BMI 28.67 kg/m2  SpO2 94%  HEMODYNAMICS: CVP:  [10 mmHg-13 mmHg] 11 mmHg  VENTILATOR SETTINGS: Vent Mode:  [-] PRVC FiO2 (%):  [40 %] 40 % Set Rate:  [18 bmp] 18 bmp Vt Set:  [550 mL] 550 mL PEEP:  [5 cmH20] 5 cmH20 Plateau Pressure:  [24 cmH20-29 cmH20] 24 cmH20  INTAKE / OUTPUT: I/O last 3 completed shifts: In: 4185.9 [I.V.:2685.9; Other:720; NG/GT:180; IV Piggyback:600] Out: [id number] [Urine:3425; Emesis/NG output:400]  PHYSICAL EXAMINATION: General: acutely ill , sedated rass -1, moves all EXt eqaully , fc  HEENT:PERL, followed commands Cardiovascular:  s 1 s2 irr Lungs: ronchi Abdomen:  Soft + bs, no r/g Musculoskeletal: intact Skin: cool  LABS:  BMET  Recent Labs Lab 09/09/15 0230 09/09/15 1204 09/10/15  NA 135 138 142  K 4.0 3.4* 3.2*  CL 100* 102 103  CO2 27 26 29   BUN 33* 33* 35*  CREATININE 1.23 1.14 1.38*  GLUCOSE 111* 91 95    Electrolytes  Recent Labs Lab 09/06/15 0525  09/07/15 0830  09/09/15 0230 09/09/15 1204 09/10/15  CALCIUM 6.9*  < >  --   < > 7.1* 7.1* 6.9*  MG 1.6*  --  2.2  --   --  2.1 1.9  PHOS 3.4  --   --   --   --  2.7 2.8  < > = values in this interval not displayed.  CBC  Recent Labs Lab 09/08/15 0345 09/09/15 0016 09/09/15 0230 09/10/15 0530  WBC 13.2*  --  13.1* 11.8*  HGB 9.2* 8.6* 8.4* 8.3*  HCT 29.3* 26.8* 26.2* 26.3*  PLT 172  --  168 159    Coag's  Recent Labs Lab  09/23/2015 1845 09/05/15 0020 09/05/15 0424 09/09/15 0016  APTT 156* 34 44* 115*  INR 1.65* 1.49 1.50*  --     Sepsis Markers  Recent Labs Lab 09/17/2015 1523 09/26/2015 1651 08/30/2015 1845 09/18/2015 2341  LATICACIDVEN 7.63* 3.5*  --  4.8*  PROCALCITON  --   --  0.29  --     ABG  Recent Labs Lab 09/05/15 0638 09/07/15 0355 09/07/15 0631  PHART 7.341* 7.491* 7.439  PCO2ART 27.8* 30.2* 35.6  PO2ART 73.0* 118* 80.0    Liver Enzymes  Recent Labs Lab 09/07/15 0437 09/08/15 0345 09/09/15 0230  AST 161* 98* 61*  ALT 153* 113* 83*  ALKPHOS 71 114 137*  BILITOT 2.2* 1.7* 1.4*  ALBUMIN 2.2* 2.1* 2.1*    Cardiac Enzymes  Recent Labs Lab 09/05/15 0424 09/05/15 0620 09/05/15 1100  TROPONINI 60.18* 64.90* >65.00*    Glucose  Recent Labs Lab 09/09/15 1129 09/09/15 1728 09/09/15 1943 09/09/15 2339 09/10/15 0355 09/10/15 0731  GLUCAP 102* 95 97 94 104* 106*    Imaging Dg Chest Port 1 View  09/10/2015  CLINICAL DATA:  Pneumonia EXAM: PORTABLE CHEST 1 VIEW  COMPARISON:  09/09/2015 FINDINGS: Support apparatus stable in satisfactorily position.  Prior CABG. Stable bilateral patchy airspace opacities with mild obscuration left hemidiaphragm. Stable mild enlargement of the cardiopericardial silhouette. No pneumothorax. IMPRESSION: 1. Overall stable. Patchy bilateral airspace opacities are unchanged and may reflect pulmonary edema or bilateral pneumonia. Electronically Signed   By: Van Clines M.D.   On: 09/10/2015 07:47     STUDIES:  CT head 1/7>> mild atrophy LHC 1/7>> no intervention but severe LV dysfunction and severe native and graft disease CXR 1/9>> EEG 1/9>>Abnormal with moderate generalized nonspecific continuous slowing of cerebral activity, no epileptic disorder  CULTURES: 1/7 MRSA>>NEG 1/7 Urine Cx>>NEG 1/7 Blood Cx>> 1/7 Tracheal Cx>>?  ANTIBIOTICS: Vancomycin 1/9>>1/11 Zosyn 1/9>>1/11 unasyn 1/11>>>stop 15th  SIGNIFICANT  EVENTS: 1/7cardiac arrest 1/8- shock 1/9- rewarm 1/10 follows commands 1/11- dnr established  LINES/TUBES: 1/7 et>> 1/7 left IJ>>> 1/7 rt rad a line>>>1/12  DISCUSSION: 44  yom with cad post arrest  ASSESSMENT / PLAN:  PULMONARY A: VDRF post arrest Suspected aspiration, has patchy infiltrates De recruit overnight, desaturation, ATX, mucous plug  P:   Weaning results in rapid failure,l tachy, HTN - concern is CAD causing Wean PS 12-16 May need trach or comfort, favor comfort with no options cad Need neg balance further to wean, was pos last 24 hrs still, see renal  CARDIOVASCULAR A:  Post cardiac arrest while shoveling snow. 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 without clear culprit for infarct h/o CABG  CHF Shock (cardiogenic) - resolved Rel AI New fib 1/11 No options CAD severe P:  Asa Heparin drip for fib Levophed off, change lasix to push dosing Cont stress steroids Lasix to tid higher dosing plavix add as failing weaning with cad as cause  RENAL A:   AKI ARF resolved Pos balance daily P:   Chem in am  Lasix off drip as BP better and not effective Lasix 120 tid  k supp bmet , mag in pm  GASTROINTESTINAL A:  Elevated transaminases - likely d/t shock liver  GI protection P:   PPI TFs back to 20  reglan   HEMATOLOGIC A:   Anticoagulation per cardiology  Leukocytosis Hematuria, trauma? P:  Hep levels lower with bloody urine  Cbc in am May need to hold, hct not down as of now  INFECTIOUS A:   Aspiration PNA P:   1/7 unasyn>>>1/9 vanc 1/9>>>1/11 Zosyn 1/9>>>1/11 unasyn 1/11>>> Stop date in place, no fevers noted   ENDOCRINE A:   Hyperglycemia Sick euthyroid P:   Insulin ssi  NEUROLOGIC A:   Currently sedated post intubation following cardiac arrest while shoveling snow. At risk anoxia - EEG abnormal showing moderate generalized nonspecific slowing  P:   Would wean fentanyl further  RASS goal:  0 porop with wua  Ccm time 30 min   Lavon Paganini. Titus Mould, MD, [place] Pgr: [place] [facility]

## 2015-09-10 NOTE — Progress Notes (Signed)
Nutrition Follow-up  INTERVENTION:   Needs bowel regimen, no bm documented.   Increase Vital AF 1.2 by 10 ml every 4 hours to goal rate of 55 ml/hr  Provides: 1584 kcal, 99 grams protein, and 1070 ml H2O. TF regimen and propofol at current rate providing 1869 total kcal/day (102 % of kcal needs)  NUTRITION DIAGNOSIS:   Inadequate oral intake related to inability to eat as evidenced by NPO status. Ongoing  GOAL:   Patient will meet greater than or equal to 90% of their needs Not met.   MONITOR:   Labs, Vent status, TF tolerance, I & O's  ASSESSMENT:   Pt with hx of severe heart disease. Admitted after arrest while shovelling snow. SCD/VF arrest/ CPR/cath/ [place].   Patient is currently intubated on ventilator support MV: 10 L/min Temp (24hrs), Avg:98.6 F (37 C), Min:97.7 F (36.5 C), Max:99.2 F (37.3 C)  Propofol: 10.8 ml/hr provides: 285 kcal per day from lipid Medications reviewed and include: Reglan added IV QID 1/12 Labs reviewed: potassium low (3.3) OG tube Vital AF 1.2 held 1/10, restarted 1/12, currently at 20 ml/hr  Diet Order:  Diet NPO time specified  Skin:  Reviewed, no issues  Last BM:  unknown  Height:   Ht Readings from Last 1 Encounters:  09/15/2015 '5\' 9"'$  (1.753 m)   Weight:   Wt Readings from Last 1 Encounters:  09/10/15 194 lb 3.6 oz (88.1 kg)  Admission weight: 175 lb (79.3 kg) 1/7  Ideal Body Weight:  72.7 kg  BMI:  Body mass index is 28.67 kg/(m^2).  Estimated Nutritional Needs:   Kcal:  2694  Protein:  95-110 grams  Fluid:  > 2 L/day  EDUCATION NEEDS:   No education needs identified at this time  [place], [place], [place] Pager [phone] After Hours Pager

## 2015-09-10 NOTE — Progress Notes (Signed)
[place] Progress Note Patient Name: Patrick Sexton [facility] DOB: 08-02-1946 MRN: [id number]   Date of Service  09/10/2015  HPI/Events of Note  Patient with sat = 88% on 40%/PRVC 18/TV 550/P 5.   eICU Interventions  Will order: 1. Portable CXR now. 2. ABG now. 3. Please give ordered Lasix dose for 10 PM now. 4. Increase FiO2 as needed to keep sat >= 92%.     Intervention Category Major Interventions: Hypoxemia - evaluation and management  Lysle Dingwall 09/10/2015, 8:23 PM

## 2015-09-10 NOTE — Progress Notes (Signed)
End of shift summary:  Pt failed weaning trial today with increased BP, increased HR with more ectopy, diaphoresis, and agitation.  Pt had multiple short episodes of severe bradycardia and occasional asystole, especially with any stimulation (turns, CPT, suction).  Diuresed 3260ml today after getting 2 doses of Lasix 120mg.   Net -1636 for this shift.  After aggressive diuresis, pt was able to tolerate 1800 turn and suction without any further bradycardia/asystole episodes.  Pt still able to wake up and f/c on Fentanyl 300mcg/hr.  Propofol off d/t HR issues.   Hematuria still noted.  Pt tolerating TF at 55ml/hr at this time.  Pt HRR goes from NSR w/ 1deg AVB and frequent pacs to A fib.  No s/s of any distress at this time.  Denies pain.   

## 2015-09-10 NOTE — Care Management Important Message (Signed)
Important Message  Patient Details  Name: Patrick Sexton [facility] MRN: [id number] Date of Birth: 08/04/1946   Medicare Important Message Given:  Yes    Barb Merino [place] 09/10/2015, 2:42 PM

## 2015-09-10 NOTE — Consult Note (Signed)
   [facility] Inpatient Consult   09/10/2015  [place] [birth year] [id number]   Patient screened for [place] Management services. Will engage when appropriate. Will continue to follow.   Marthenia Rolling, MSN-Ed, RN,BSN [facility] Liaison [phone]

## 2015-09-10 NOTE — Progress Notes (Signed)
[place] Progress Note Patient Name: Patrick Sexton [facility] DOB: 05-Sep-1945 MRN: [id number]   Date of Service  09/10/2015  HPI/Events of Note  Patient now bleeding from ETT and foley.  On heparin infusion.  Also patient is inadequately sedated.  On max fentanyl infusion.  Rounding team wanting to avoid benzos.  Attempted use of propofol today but drop in HR a concern.  Patient is now tachy with HR in the 110s to 120s with elevated RR in the high 20s.  eICU Interventions  Plan: Will increase PRN dose of fentanyl D/C heparin infusion Try IV propofol one more time Monitor HR Patient's DNR status noted.     Intervention Category Intermediate Interventions: Bleeding - evaluation and treatment with blood products  DETERDING,ELIZABETH 09/10/2015, 10:12 PM

## 2015-09-10 NOTE — Progress Notes (Signed)
Pharmacy Antibiotic Follow-up Note  Patrick Sexton [name] is a 70 y.o. year-old male admitted on 08/29/2015 s/p cardiac arrest.  The patient is currently on day 5 of antibiotics (currently Unasyn) for potential aspiration pneumonia.  Assessment/Plan: This patient's current antibiotics will be continued without adjustments. - WBC improving, afebrile, LA and PCT improved, cultures negative (final) - continue Unasyn 3 g q6h to complete 7 day total course of abx; stop date entered for 09/12/15.  Temp (24hrs), Avg:98.6 F (37 C), Min:97.7 F (36.5 C), Max:99.2 F (37.3 C)   Recent Labs Lab 09/06/15 0525 09/07/15 0437 09/08/15 0345 09/09/15 0230 09/10/15 0530  WBC 12.1* 21.1* 13.2* 13.1* 11.8*     Recent Labs Lab 09/08/15 0345 09/09/15 0230 09/09/15 1204 09/10/15 09/10/15 1037  CREATININE 1.06 1.23 1.14 1.38* 1.23   Estimated Creatinine Clearance: 62.3 mL/min (by C-G formula based on Cr of 1.23).    No Known Allergies  Antimicrobials this admission: Unasyn 1/8 >> 1/9 >> restarted 1/11 (stop entered for 1/15) Vancomycin 1/9 >> 1/11 Zosyn 1/9 >> 1/11  Levels/dose changes this admission: none  Microbiology results: 1/7 MRSA PCR negative 1/7 BCx x2: NG 1/7 UCx: NG  Thank you for allowing pharmacy to be a part of this patient's care.  Governor Specking, PharmD Clinical Pharmacy Resident Pager: [phone] 09/10/2015 1:56 PM

## 2015-09-10 NOTE — Progress Notes (Signed)
ANTICOAGULATION CONSULT NOTE - Follow Up Consult  Pharmacy Consult for Heparin  Indication: atrial fibrillation  No Known Allergies  Patient Measurements: Height: 5\' 9"  (175.3 cm) Weight: 194 lb 3.6 oz (88.1 kg) IBW/kg (Calculated) : 70.7  Vital Signs: Temp: 99.2 F (37.3 C) (01/13 0700) Temp Source: Axillary (01/13 0700) BP: 104/66 mmHg (01/13 0800) Pulse Rate: 101 (01/13 0905)  Labs:  Recent Labs  09/08/15 0345 09/08/15 1227 09/09/15 0016 09/09/15 0230 09/09/15 0238 09/09/15 1204 09/10/15 09/10/15 0530  HGB 9.2*  --  8.6* 8.4*  --   --   --  8.3*  HCT 29.3*  --  26.8* 26.2*  --   --   --  26.3*  PLT 172  --   --  168  --   --   --  159  APTT  --   --  115*  --   --   --   --   --   HEPARINUNFRC 0.23* 0.43  --   --  0.44  --   --  0.44  CREATININE 1.06  --   --  1.23  --  1.14 1.38*  --     Estimated Creatinine Clearance: 55.5 mL/min (by C-G formula based on Cr of 1.38).   Assessment: 70 y/o M s/p cardiac arrest while shoveling snow, now s/p cath. Per cardiology, heparin is no longer needed for medical management, but pt is now having pAF. CCM continuing heparin for now pending transition to oral anticoagulation.   HL remains therapeutic at 0.44 on 1550 units/hr. Pt still w/ hematuria. CBC stable w/ Hgb 8.3, Plt 159.   Goal of Therapy:  Heparin level 0.3-0.7 units/ml Monitor platelets by anticoagulation protocol: Yes   Plan:  - will reduce rate to 1500 units/hr given hematuria; Dr. Titus Mould wants to target lower heparin level - Daily HL, CBC - monitor for s/sx of bleeding - f/u transition to oral AC when more stable  Governor Specking, PharmD Clinical Pharmacy Resident Pager: [phone]  09/10/2015,10:16 AM

## 2015-09-11 ENCOUNTER — Inpatient Hospital Stay (HOSPITAL_COMMUNITY): Payer: Medicare Other

## 2015-09-11 LAB — GLUCOSE, CAPILLARY
GLUCOSE-CAPILLARY: 131 mg/dL — AB (ref 65–99)
GLUCOSE-CAPILLARY: 117 mg/dL — AB (ref 65–99)
Glucose-Capillary: 142 mg/dL — ABNORMAL HIGH (ref 65–99)
Glucose-Capillary: 154 mg/dL — ABNORMAL HIGH (ref 65–99)
Glucose-Capillary: 138 mg/dL — ABNORMAL HIGH (ref 65–99)
Glucose-Capillary: 132 mg/dL — ABNORMAL HIGH (ref 65–99)

## 2015-09-11 LAB — BASIC METABOLIC PANEL
Anion gap: 12 (ref 5–15)
Anion gap: 14 (ref 5–15)
BUN: 40 mg/dL — ABNORMAL HIGH (ref 6–20)
BUN: 41 mg/dL — ABNORMAL HIGH (ref 6–20)
CO2: 30 mmol/L (ref 22–32)
CO2: 30 mmol/L (ref 22–32)
Calcium: 7.2 mg/dL — ABNORMAL LOW (ref 8.9–10.3)
Calcium: 7.2 mg/dL — ABNORMAL LOW (ref 8.9–10.3)
Chloride: 102 mmol/L (ref 101–111)
Chloride: 104 mmol/L (ref 101–111)
Creatinine, Ser: 1.36 mg/dL — ABNORMAL HIGH (ref 0.61–1.24)
Creatinine, Ser: 1.36 mg/dL — ABNORMAL HIGH (ref 0.61–1.24)
GFR calc Af Amer: 60 mL/min — ABNORMAL LOW (ref >60)
GFR calc Af Amer: 60 mL/min — ABNORMAL LOW (ref >60)
GFR calc non Af Amer: 52 mL/min — ABNORMAL LOW (ref >60)
GFR calc non Af Amer: 52 mL/min — ABNORMAL LOW (ref >60)
Glucose, Bld: 134 mg/dL — ABNORMAL HIGH (ref 65–99)
Glucose, Bld: 149 mg/dL — ABNORMAL HIGH (ref 65–99)
Potassium: 2.6 mmol/L — CL (ref 3.5–5.1)
Potassium: 3.5 mmol/L (ref 3.5–5.1)
Sodium: 146 mmol/L — ABNORMAL HIGH (ref 135–145)
Sodium: 146 mmol/L — ABNORMAL HIGH (ref 135–145)

## 2015-09-11 LAB — CBC
HEMATOCRIT: 26.9 % — AB (ref 39.0–52.0)
HEMOGLOBIN: 8.4 g/dL — AB (ref 13.0–17.0)
MCH: 25.1 pg — ABNORMAL LOW (ref 26.0–34.0)
MCHC: 31.2 g/dL (ref 30.0–36.0)
MCV: 80.3 fL (ref 78.0–100.0)
Platelets: 196 10*3/uL (ref 150–400)
RBC: 3.35 MIL/uL — AB (ref 4.22–5.81)
RDW: 16.6 % — ABNORMAL HIGH (ref 11.5–15.5)
WBC: 14.6 10*3/uL — AB (ref 4.0–10.5)

## 2015-09-11 LAB — PHOSPHORUS
PHOSPHORUS: 2.8 mg/dL (ref 2.5–4.6)
PHOSPHORUS: 3.3 mg/dL (ref 2.5–4.6)

## 2015-09-11 LAB — MAGNESIUM
MAGNESIUM: 2.1 mg/dL (ref 1.7–2.4)
Magnesium: 2 mg/dL (ref 1.7–2.4)

## 2015-09-11 MED ORDER — POTASSIUM CHLORIDE 20 MEQ/15ML (10%) PO SOLN
40.0000 meq | ORAL | Status: AC
  Administered 2015-09-11 (×2): 40 meq
  Filled 2015-09-11 (×2): qty 30

## 2015-09-11 MED ORDER — POTASSIUM CHLORIDE 10 MEQ/50ML IV SOLN
10.0000 meq | INTRAVENOUS | Status: AC
  Administered 2015-09-11 (×2): 10 meq via INTRAVENOUS
  Filled 2015-09-11 (×3): qty 50

## 2015-09-11 MED ORDER — PROPOFOL 1000 MG/100ML IV EMUL
5.0000 ug/kg/min | INTRAVENOUS | Status: DC
  Administered 2015-09-11 – 2015-09-12 (×2): 10 ug/kg/min via INTRAVENOUS
  Administered 2015-09-12: 20 ug/kg/min via INTRAVENOUS
  Filled 2015-09-11 (×3): qty 100

## 2015-09-11 MED ORDER — POTASSIUM CHLORIDE 20 MEQ/15ML (10%) PO SOLN
40.0000 meq | Freq: Two times a day (BID) | ORAL | Status: AC
  Administered 2015-09-11 (×2): 40 meq
  Filled 2015-09-11 (×2): qty 30

## 2015-09-11 MED ORDER — PNEUMOCOCCAL VAC POLYVALENT 25 MCG/0.5ML IJ INJ: 0.5000 mL | INJECTION | INTRAMUSCULAR | Status: DC | PRN

## 2015-09-11 MED ORDER — FUROSEMIDE 10 MG/ML IJ SOLN
80.0000 mg | Freq: Two times a day (BID) | INTRAMUSCULAR | Status: DC
  Administered 2015-09-12 – 2015-09-13 (×3): 80 mg via INTRAVENOUS
  Filled 2015-09-11 (×3): qty 8

## 2015-09-11 MED ORDER — SODIUM CHLORIDE 0.9 % IJ SOLN: 10.0000 mL | INTRAMUSCULAR | Status: DC | PRN

## 2015-09-11 MED ORDER — PROPOFOL 1000 MG/100ML IV EMUL
INTRAVENOUS | Status: AC
  Filled 2015-09-11: qty 100

## 2015-09-11 MED ORDER — SODIUM CHLORIDE 0.9 % IJ SOLN
10.0000 mL | Freq: Two times a day (BID) | INTRAMUSCULAR | Status: DC
  Administered 2015-09-11 (×2): 10 mL

## 2015-09-11 NOTE — Progress Notes (Signed)
Pt was very aggitated this am and urine began running out around the foley tube.  I flushed the catheter through the leur lock port about 10am, getting over 700cc fruit punch colored urine back.  Pt was much more comfortable after that.  I had already flushed it during my initial assessment since the previous shift had had trouble with the catheter clogging up.  About 12 pm, there was leaking around the foley again, which was alleviated with flushing.  Then again about 1300 the foley began leaking again, aftetr flushing it, I called the NP with CCM, Noe Gens, we discussed the possibility of needing urology,. But decided to attempt to break the seal and irrigate the clots out first.  I Irrigated the bladder getting a significant number of small clots out, after which the foley flowed freely.  Called Brandi back and we discussed continuing to be vigilent and irrigating it periodically as needed, but holding off on urology for now.  Foley seems to be patents and urine color is a somewhat lighter shade of pink.  Carol Ada, RN

## 2015-09-11 NOTE — Progress Notes (Signed)
[place] Progress Note Patient Name: Patrick Sexton [facility] DOB: 08-20-1946 MRN: [id number]   Date of Service  09/11/2015  HPI/Events of Note  Persistent hypokalemia  eICU Interventions  Total 80 mEq via tube Additional 20 mEq IV potassium replacement     Intervention Category Major Interventions: Electrolyte abnormality - evaluation and management  DETERDING,ELIZABETH 09/11/2015, 12:43 AM

## 2015-09-11 NOTE — Progress Notes (Signed)
Pt agitated,tachypneic,tachy cardic,gave extra fentanyl bolus and was maxed on fentanyl gtt. Notified E link

## 2015-09-11 NOTE — Progress Notes (Signed)
[place] Progress Note Patient Name: Patrick Sexton [facility] DOB: Jan 04, 1946 MRN: [id number]   Date of Service  09/11/2015  HPI/Events of Note  Patient with gastric residual greater than 450 end tube feeds being suctioned orally.  eICU Interventions  Tube feeds to be held for now.     Intervention Category Minor Interventions: Other:  Mauri [place], P 09/11/2015, 11:50 PM

## 2015-09-11 NOTE — Progress Notes (Signed)
Tube feeds was noted to pt mouth when suctioned orally-OGT check for placement was ok but unable to get residual so replaced OGT w/ m16 french and 450 cc residual was noted and notified e link and hold feeds for now.

## 2015-09-11 NOTE — Progress Notes (Signed)
Pt foley bag clotted off at exit point and urine was not able to drain from the bag. Pt has bloody urine with multiple blood clots. Attempted to irrigate bag but with no result. Seal was broken and bag was changed using sterile technique.

## 2015-09-11 NOTE — Progress Notes (Signed)
[place] Progress Note Patient Name: Patrick Sexton [facility] DOB: 1946-02-04 MRN: [id number]   Date of Service  09/11/2015  HPI/Events of Note  Heparin D/Ced d/t urethral bleeding. Need DVT prophylaxis.   eICU Interventions  Will order SCD placed to bilateral lower extremities.      Intervention Category Intermediate Interventions: Best-practice therapies (e.g. DVT, beta blocker, etc.)  Sommer,Steven Eugene 09/11/2015, 8:00 PM

## 2015-09-11 NOTE — Progress Notes (Signed)
PULMONARY / CRITICAL CARE MEDICINE   Name: Dimari Confer [facility] MRN: [id number] DOB: 05/23/1946    ADMISSION DATE:  09/08/2015  REFERRING MD:  EDP  CHIEF COMPLAINT:  Cardiac arrest    SUBJECTIVE:  RN reports bloody urine - "looks like fruit punch", irrigated cath throughout the day, remains on Fentanyl, TF at 55 ml/hr, hx of brady with fentanyl weaning   VITAL SIGNS: BP 109/65 mmHg  Pulse 92  Temp(Src) 99 F (37.2 C) (Axillary)  Resp 18  Ht 5\' 9"  (1.753 m)  Wt 188 lb 0.8 oz (85.3 kg)  BMI 27.76 kg/m2  SpO2 99%  HEMODYNAMICS: CVP:  [10 mmHg-14 mmHg] 12 mmHg  VENTILATOR SETTINGS: Vent Mode:  [-] PRVC FiO2 (%):  [40 %-60 %] 50 % Set Rate:  [18 bmp] 18 bmp Vt Set:  [550 mL] 550 mL PEEP:  [5 cmH20] 5 cmH20 Plateau Pressure:  [20 cmH20-27 cmH20] 20 cmH20  INTAKE / OUTPUT: I/O last 3 completed shifts: In: 6133.8 [I.V.:3503.6; Other:330; NG/GT:1264.3; IV Piggyback:1036] Out: 6920 [Urine:6920]  PHYSICAL EXAMINATION: General: acutely ill on mechanical ventilation  HEENT: PERL, followed commands Cardiovascular:  s1s2 irr Lungs: even/non-labored, lungs bilaterally coarse  Abdomen:  Soft + bs, no r/g Musculoskeletal: intact Skin: cool  LABS:  BMET  Recent Labs Lab 09/10/15 1037 09/10/15 2340 09/11/15 1111  NA 143 146* 146*  K 3.3* 2.6* 3.5  CL 106 102 104  CO2 26 30 30   BUN 37* 40* 41*  CREATININE 1.23 1.36* 1.36*  GLUCOSE 104* 134* 149*    Electrolytes  Recent Labs Lab 09/10/15 1037 09/10/15 2340 09/11/15 1111  CALCIUM 6.9* 7.2* 7.2*  MG 1.9 2.0 2.1  PHOS 2.7 3.3 2.8    CBC  Recent Labs Lab 09/09/15 0230 09/10/15 0530 09/11/15 0550  WBC 13.1* 11.8* 14.6*  HGB 8.4* 8.3* 8.4*  HCT 26.2* 26.3* 26.9*  PLT 168 159 196    Coag's  Recent Labs Lab 09/20/2015 1845 09/05/15 0020 09/05/15 0424 09/09/15 0016  APTT 156* 34 44* 115*  INR 1.65* 1.49 1.50*  --     Sepsis Markers  Recent Labs Lab 09/03/2015 1523 09/01/2015 1651 09/26/2015 1845  09/20/2015 2341  LATICACIDVEN 7.63* 3.5*  --  4.8*  PROCALCITON  --   --  0.29  --     ABG  Recent Labs Lab 09/07/15 0355 09/07/15 0631 09/10/15 2049  PHART 7.491* 7.439 7.504*  PCO2ART 30.2* 35.6 34.0*  PO2ART 118* 80.0 59.0*    Liver Enzymes  Recent Labs Lab 09/07/15 0437 09/08/15 0345 09/09/15 0230  AST 161* 98* 61*  ALT 153* 113* 83*  ALKPHOS 71 114 137*  BILITOT 2.2* 1.7* 1.4*  ALBUMIN 2.2* 2.1* 2.1*    Cardiac Enzymes  Recent Labs Lab 09/05/15 0424 09/05/15 0620 09/05/15 1100  TROPONINI 60.18* 64.90* >65.00*    Glucose  Recent Labs Lab 09/10/15 1252 09/10/15 1614 09/10/15 2024 09/10/15 2329 09/11/15 0522 09/11/15 0852  GLUCAP 99 131* 169* 145* 131* 142*    Imaging Dg Chest Port 1 View  09/11/2015  CLINICAL DATA:  Adult respiratory distress syndrome. On ventilator. Status post ventricular fibrillation. Left bundle branch block. Congestive cardiac failure. Previous myocardial infarct. EXAM: PORTABLE CHEST 1 VIEW COMPARISON:  09/10/2015 FINDINGS: Support lines and tubes in appropriate position. Diffuse heterogeneous bilateral airspace disease shows no significant change. Cardiomegaly is also stable. Patient has undergone previous CABG. The no pneumothorax visualized. IMPRESSION: No significant change in cardiomegaly and diffuse bilateral airspace disease. Electronically Signed   By: Jenny Reichmann  Kris Hartmann M.D.   On: 09/11/2015 09:41   Dg Chest Port 1 View  09/10/2015  CLINICAL DATA:  70 year old male with shortness of breath and hypoxia. EXAM: PORTABLE CHEST 1 VIEW COMPARISON:  Radiograph dated 09/10/2015 FINDINGS: Endotracheal tube, and left IJ central line in stable positioning. Partially visualized enteric tube. The patient is rotated. Bilateral patchy airspace opacity again noted which appear slightly increased compared to the prior study. There is stable cardiomegaly. Median sternotomy wires and CABG vascular clips. Osseous structures appear unremarkable.  IMPRESSION: Mild interval progression of the bilateral patchy airspace opacities. Support device in stable positioning. Electronically Signed   By: Anner Crete M.D.   On: 09/10/2015 21:01     STUDIES:  CT head 1/7>> mild atrophy LHC 1/7>> no intervention but severe LV dysfunction and severe native and graft disease CXR 1/9>> EEG 1/9>>Abnormal with moderate generalized nonspecific continuous slowing of cerebral activity, no epileptic disorder  CULTURES: 1/7 MRSA>>NEG 1/7 Urine Cx>>NEG 1/7 Blood Cx>> 1/7 Tracheal Cx>>?  ANTIBIOTICS: Vancomycin 1/9>>1/11 Zosyn 1/9>>1/11 unasyn 1/11>>>stop 15th  SIGNIFICANT EVENTS: 1/7cardiac arrest 1/8- shock 1/9- rewarm 1/10 follows commands 1/11- dnr established  LINES/TUBES: 1/7 et>> 1/7 left IJ>>> 1/7 rt rad a line>>>1/12  DISCUSSION: 70 yo with hx CAD who was shoveling snow and went down. 15 min Vfib arrest. Cath no culprit, medical mgmt.   ASSESSMENT / PLAN:  PULMONARY A: VDRF post arrest Suspected aspiration, has patchy infiltrates Derecruit overnight, desaturation, ATX, mucous plug P:   Poor weaning, concerned CAD causing  Daily SBT / WUA  May need trach or comfort, recommend comfort given no options cad Diuresis as renal function / BP permit   CARDIOVASCULAR A:  Post cardiac arrest while shoveling snow. CPR 10 min epi x 1 LHC with severe native and bypass disease without clear culprit for infarct h/o CABG  CHF Shock (cardiogenic) - resolved Relative AI New fib 1/11 No options CAD severe P:  Asa Hold heparin gtt, consider restart in am 1/15 if bleeding reduced Levophed off, change lasix to push dosing Cont stress dose steroids Reduce lasix to 80 mg BID  Continue plavix   RENAL A:   AKI ARF resolved Pos balance daily P:   Trend BMP / UOP  Replace electrolytes as indicated   Reduce lasix as above   GASTROINTESTINAL A:  Elevated transaminases - likely d/t shock liver  GI protection P:   PPI TF  per protocol  reglan 5 mg IV Q6, consider d/c am 1/15   HEMATOLOGIC A:   Anticoagulation per cardiology  Leukocytosis Hematuria, trauma? P:  Hold heparin gtt with bleeding Trend CBC  Monitor urethral bleeding   INFECTIOUS A:   Aspiration PNA P:   Monitor fever curve / WBC  Cultures / abx as above  Stop date in place, no fevers noted   ENDOCRINE A:   Hyperglycemia Sick euthyroid P:   SSI Lantus 10 units Q24 Repeat TSH 4-6 weeks  NEUROLOGIC A:   Currently sedated post intubation following cardiac arrest while shoveling snow. At risk anoxia - EEG abnormal showing moderate generalized nonspecific slowing  P:   Fentanyl gtt for pain RASS goal: 0 Daily WUA / SBT   Noe Gens, NP-C [facility] Pgr: [phone] or if no answer [phone] 09/11/2015, 12:33 PM    Attending Note:  I have examined patient, reviewed labs, studies and notes. I have discussed the case with B Ollis, and I agree with the data and plans as amended above. S/p  cardiac arrest due to severe CAD and MI. On my eval he is awake and alert, but he cannot tolerate PSV. I am concerned he will not extubate due to cardiac strain with the WOB. We will consider restarting heparin on 1/15 if hematuria issue is clarified. Independent critical care time is 35 minutes.   Baltazar Apo, MD, PhD 09/11/2015, 7:12 PM [facility] [phone] or if no answer [phone]

## 2015-09-11 NOTE — Progress Notes (Signed)
[place] Progress Note Patient Name: Patrick Sexton [facility] DOB: Apr 27, 1946 MRN: [id number]   Date of Service  09/11/2015  HPI/Events of Note  Patient is uncomfortable and breathing at 40 X minute. Maxed out on Fentanyl IV infusion at 400 mcg/hour.  eICU Interventions  Will restart Propofol IV infusion.      Intervention Category Minor Interventions: Agitation / anxiety - evaluation and management  Lysle Dingwall 09/11/2015, 9:59 PM

## 2015-09-12 ENCOUNTER — Inpatient Hospital Stay (HOSPITAL_COMMUNITY): Payer: Medicare Other

## 2015-09-12 LAB — BASIC METABOLIC PANEL
ANION GAP: 11 (ref 5–15)
ANION GAP: 12 (ref 5–15)
Anion gap: 14 (ref 5–15)
BUN: 40 mg/dL — AB (ref 6–20)
BUN: 42 mg/dL — ABNORMAL HIGH (ref 6–20)
BUN: 45 mg/dL — ABNORMAL HIGH (ref 6–20)
CALCIUM: 7.6 mg/dL — AB (ref 8.9–10.3)
CHLORIDE: 106 mmol/L (ref 101–111)
CHLORIDE: 102 mmol/L (ref 101–111)
CO2: 31 mmol/L (ref 22–32)
CO2: 33 mmol/L — AB (ref 22–32)
CO2: 34 mmol/L — ABNORMAL HIGH (ref 22–32)
Calcium: 6.9 mg/dL — ABNORMAL LOW (ref 8.9–10.3)
Calcium: 7.7 mg/dL — ABNORMAL LOW (ref 8.9–10.3)
Chloride: 104 mmol/L (ref 101–111)
Creatinine, Ser: 1.21 mg/dL (ref 0.61–1.24)
Creatinine, Ser: 1.21 mg/dL (ref 0.61–1.24)
Creatinine, Ser: 1.37 mg/dL — ABNORMAL HIGH (ref 0.61–1.24)
GFR calc Af Amer: >60 mL/min (ref >60)
GFR calc non Af Amer: 59 mL/min — ABNORMAL LOW (ref >60)
GFR, EST AFRICAN AMERICAN: 59 mL/min — AB (ref >60)
GFR, EST NON AFRICAN AMERICAN: 59 mL/min — AB (ref >60)
GFR, EST NON AFRICAN AMERICAN: 51 mL/min — AB (ref >60)
GLUCOSE: 154 mg/dL — AB (ref 65–99)
Glucose, Bld: 136 mg/dL — ABNORMAL HIGH (ref 65–99)
Glucose, Bld: 129 mg/dL — ABNORMAL HIGH (ref 65–99)
POTASSIUM: 3.2 mmol/L — AB (ref 3.5–5.1)
POTASSIUM: 2.8 mmol/L — AB (ref 3.5–5.1)
Potassium: 4.2 mmol/L (ref 3.5–5.1)
SODIUM: 149 mmol/L — AB (ref 135–145)
SODIUM: 150 mmol/L — AB (ref 135–145)
Sodium: 148 mmol/L — ABNORMAL HIGH (ref 135–145)

## 2015-09-12 LAB — PHOSPHORUS
PHOSPHORUS: 3.2 mg/dL (ref 2.5–4.6)
PHOSPHORUS: 4.3 mg/dL (ref 2.5–4.6)
PHOSPHORUS: 3.7 mg/dL (ref 2.5–4.6)

## 2015-09-12 LAB — CBC
HEMATOCRIT: 26.7 % — AB (ref 39.0–52.0)
HEMOGLOBIN: 8.2 g/dL — AB (ref 13.0–17.0)
MCH: 25.3 pg — ABNORMAL LOW (ref 26.0–34.0)
MCHC: 30.7 g/dL (ref 30.0–36.0)
MCV: 82.4 fL (ref 78.0–100.0)
Platelets: 217 10*3/uL (ref 150–400)
RBC: 3.24 MIL/uL — ABNORMAL LOW (ref 4.22–5.81)
RDW: 16.9 % — ABNORMAL HIGH (ref 11.5–15.5)
WBC: 14.1 10*3/uL — AB (ref 4.0–10.5)

## 2015-09-12 LAB — GLUCOSE, CAPILLARY
GLUCOSE-CAPILLARY: 109 mg/dL — AB (ref 65–99)
GLUCOSE-CAPILLARY: 125 mg/dL — AB (ref 65–99)
GLUCOSE-CAPILLARY: 131 mg/dL — AB (ref 65–99)
Glucose-Capillary: 134 mg/dL — ABNORMAL HIGH (ref 65–99)
Glucose-Capillary: 113 mg/dL — ABNORMAL HIGH (ref 65–99)
Glucose-Capillary: 103 mg/dL — ABNORMAL HIGH (ref 65–99)

## 2015-09-12 LAB — MAGNESIUM
MAGNESIUM: 2.5 mg/dL — AB (ref 1.7–2.4)
MAGNESIUM: 2.2 mg/dL (ref 1.7–2.4)
Magnesium: 1.8 mg/dL (ref 1.7–2.4)

## 2015-09-12 LAB — TRIGLYCERIDES: Triglycerides: 172 mg/dL — ABNORMAL HIGH (ref <150)

## 2015-09-12 MED ORDER — SODIUM CHLORIDE 0.9 % IJ SOLN: 10.0000 mL | INTRAMUSCULAR | Status: DC | PRN

## 2015-09-12 MED ORDER — SODIUM CHLORIDE 0.9 % IJ SOLN
10.0000 mL | Freq: Two times a day (BID) | INTRAMUSCULAR | Status: DC
  Administered 2015-09-12 – 2015-09-13 (×2): 10 mL
  Administered 2015-09-13 – 2015-09-14 (×2): 30 mL
  Administered 2015-09-14 – 2015-09-20 (×9): 10 mL

## 2015-09-12 MED ORDER — SENNOSIDES-DOCUSATE SODIUM 8.6-50 MG PO TABS
1.0000 | ORAL_TABLET | Freq: Every day | ORAL | Status: DC
  Administered 2015-09-12 – 2015-09-20 (×8): 1
  Filled 2015-09-12 (×8): qty 1

## 2015-09-12 MED ORDER — MAGNESIUM SULFATE 2 GM/50ML IV SOLN
2.0000 g | Freq: Once | INTRAVENOUS | Status: AC
  Administered 2015-09-12: 2 g via INTRAVENOUS
  Filled 2015-09-12: qty 50

## 2015-09-12 MED ORDER — POLYETHYLENE GLYCOL 3350 17 G PO PACK
17.0000 g | PACK | Freq: Every day | ORAL | Status: DC
  Administered 2015-09-12 – 2015-09-20 (×6): 17 g
  Filled 2015-09-12 (×6): qty 1

## 2015-09-12 MED ORDER — SENNOSIDES-DOCUSATE SODIUM 8.6-50 MG PO TABS
1.0000 | ORAL_TABLET | Freq: Once | ORAL | Status: AC
  Administered 2015-09-12: 1
  Filled 2015-09-12: qty 1

## 2015-09-12 NOTE — Progress Notes (Signed)
Pt began having urine leak from around the urinary catheter.  Foley had been flushed with foley care this am per orders from the CCM NP, and had returned a couple of very small stringy clots.  After the leaking began again, I irrigated with a 60cc syringe and sterile saline, maintaining sterility as much as possible.  Returned pink tinged urine and several more pencil eraser  size clots.  Urine flowed freely at this point.  Notified Noe Gens, NP and Dr Lamonte Sakai who were in morning rounds.  I was instructed to continue irrigating periodically.  Carol Ada, RN

## 2015-09-12 NOTE — Progress Notes (Signed)
PULMONARY / CRITICAL CARE MEDICINE   Name: Patrick Sexton [facility] MRN: [id number] DOB: 1945-12-23    ADMISSION DATE:  09/11/2015  REFERRING MD:  EDP  CHIEF COMPLAINT:  Cardiac arrest    SUBJECTIVE:  RN reports improved hematuria, on propofol for sedation - apparent agitation overnight.  Fentanyl gtt remains at 400 mcg/hr.  TF held overnight with coughing & concern for possible TF colored secretions.  KUB with confirmed NGT placement.    VITAL SIGNS: BP 99/60 mmHg  Pulse 66  Temp(Src) 99.2 F (37.3 C) (Oral)  Resp 18  Ht 5\' 9"  (1.753 m)  Wt 179 lb 0.2 oz (81.2 kg)  BMI 26.42 kg/m2  SpO2 98%  HEMODYNAMICS: CVP:  [6 mmHg-14 mmHg] 9 mmHg  VENTILATOR SETTINGS: Vent Mode:  [-] PRVC FiO2 (%):  [40 %-60 %] 40 % Set Rate:  [18 bmp] 18 bmp Vt Set:  [550 mL] 550 mL PEEP:  [5 cmH20] 5 cmH20 Plateau Pressure:  [20 cmH20-26 cmH20] 21 cmH20  INTAKE / OUTPUT: I/O last 3 completed shifts: In: 4123.4 [I.V.:1701.4; NG/GT:1540; IV Piggyback:882] Out: [id number] [Urine:8830]  PHYSICAL EXAMINATION: General: acutely ill on mechanical ventilation Neuro: follows commands, generalized weakness but moves all extremities   HEENT: PERL, mm pink/moist Cardiovascular:  s1s2 irr Lungs: even/non-labored, lungs bilaterally coarse  Abdomen:  Soft + bs, no r/g Musculoskeletal: intact Skin: cool  LABS:  BMET  Recent Labs Lab 09/10/15 2340 09/11/15 1111 09/11/15 2315  NA 146* 146* 148*  K 2.6* 3.5 4.2  CL 102 104 106  CO2 30 30 31   BUN 40* 41* 40*  CREATININE 1.36* 1.36* 1.21  GLUCOSE 134* 149* 154*    Electrolytes  Recent Labs Lab 09/10/15 2340 09/11/15 1111 09/11/15 2315  CALCIUM 7.2* 7.2* 6.9*  MG 2.0 2.1 1.8  PHOS 3.3 2.8 3.2    CBC  Recent Labs Lab 09/10/15 0530 09/11/15 0550 09/12/15 0421  WBC 11.8* 14.6* 14.1*  HGB 8.3* 8.4* 8.2*  HCT 26.3* 26.9* 26.7*  PLT 159 196 217    Coag's  Recent Labs Lab 09/09/15 0016  APTT 115*    Sepsis Markers No results for input(s):  LATICACIDVEN, PROCALCITON, O2SATVEN in the last 168 hours.  ABG  Recent Labs Lab 09/07/15 0355 09/07/15 0631 09/10/15 2049  PHART 7.491* 7.439 7.504*  PCO2ART 30.2* 35.6 34.0*  PO2ART 118* 80.0 59.0*    Liver Enzymes  Recent Labs Lab 09/07/15 0437 09/08/15 0345 09/09/15 0230  AST 161* 98* 61*  ALT 153* 113* 83*  ALKPHOS 71 114 137*  BILITOT 2.2* 1.7* 1.4*  ALBUMIN 2.2* 2.1* 2.1*    Cardiac Enzymes  Recent Labs Lab 09/05/15 1100  TROPONINI >65.00*    Glucose  Recent Labs Lab 09/11/15 0852 09/11/15 1209 09/11/15 1559 09/11/15 1951 09/11/15 2326 09/12/15 0404  GLUCAP 142* 154* 138* 117* 132* 109*    Imaging Dg Abd Portable 1v  09/12/2015  CLINICAL DATA:  Orogastric tube placement. EXAM: PORTABLE ABDOMEN - 1 VIEW COMPARISON:  08/30/2015. FINDINGS: Orogastric tube tip lies in the stomach along the greater curvature. Gas pattern nonobstructive. IMPRESSION: Orogastric tube tip lies in the stomach. Electronically Signed   By: Staci Righter M.D.   On: 09/12/2015 07:09     STUDIES:  CT head 1/7 >> mild atrophy LHC 1/7 >> no intervention but severe LV dysfunction and severe native and graft disease EEG 1/9 >> Abnormal with moderate generalized nonspecific continuous slowing of cerebral activity, no epileptic disorder  CULTURES: 1/7 MRSA >> NEG 1/7 Urine  Cx >> NEG 1/7 Blood Cx >> NEG  1/7 Tracheal Cx >> ? If done >>  ANTIBIOTICS: Vancomycin 1/9 >> 1/11 Zosyn 1/9 >> 1/11 unasyn 1/11 >> 1/15  SIGNIFICANT EVENTS: 01/07  cardiac arrest 01/08  shock 01/09  rewarm 01/10  follows commands 01/11  dnr established  LINES/TUBES: 1/7 et >> 1/7 left IJ >> 1/7 rt rad a line >> 1/12  DISCUSSION: 70 y/o with hx severe CAD who was shoveling snow and collapsed, 15 min Vfib arrest. Cath no culprit, medical mgmt. Difficult weaning, frequent PVC's, bradycardia, AF   ASSESSMENT / PLAN:  PULMONARY A: VDRF post arrest Suspected aspiration, has patchy  infiltrates Derecruit overnight, desaturation, ATX, mucous plug P:   PRVC, 8 cc/kg Wean PEEP / FiO2 for sats > 92% Poor weaning, concerned CAD causing  Daily SBT / WUA  Intermittent CXR May need trach or comfort care, recommend comfort given no options CAD Diuresis as renal function / BP permit   CARDIOVASCULAR A:  Post cardiac arrest while shoveling snow. CPR 10 min epi x 1 LHC with severe native and bypass disease without clear culprit for infarct Hx CAD s/p CABG  CHF Shock (cardiogenic) - resolved Relative AI New Paroxsymal AF - 1/11 Severe CAD - no intervention options, medical management  P:  ASA QD  Hold heparin gtt, will need Cardiology input regarding restart and long term anticoagulation.  Hold for now as in [place].  Cont stress dose steroids Lasix to 80 mg BID (reduced 1/14) Continue plavix   RENAL A:   AKI ARF resolved Pos balance daily P:   Trend BMP / UOP  Replace electrolytes as indicated   Lasix as above  GASTROINTESTINAL A:  Elevated transaminases - likely d/t shock liver  GI protection P:   PPI Resume TF per protocol  Reglan 5 mg IV Q6  HEMATOLOGIC A:   Anticoagulation per cardiology  Leukocytosis Hematuria, trauma? P:  Hold heparin gtt with bleeding Trend CBC  Monitor urethral bleeding, improved 1/15  INFECTIOUS A:   Aspiration PNA P:   Monitor fever curve / WBC  Cultures / abx as above  Stop date in place, no fevers noted  ENDOCRINE A:   Hyperglycemia Sick euthyroid P:   SSI Lantus 10 units Q24 Repeat TSH 4-6 weeks  NEUROLOGIC A:   Currently sedated post intubation following cardiac arrest while shoveling snow At risk anoxia - EEG abnormal showing moderate generalized nonspecific slowing  P:   Fentanyl gtt for pain RASS goal: 0 Daily WUA / SBT   Noe Gens, NP-C [place] [facility] Pgr: [phone] or if no answer [phone] 09/12/2015, 7:58 AM   Attending Note:  I have examined patient, reviewed labs,  studies and notes. I have discussed the case with B Ollis, and I agree with the data and plans as amended above. Severe CAD s/p MI and cardiac arrest, course c/b A Fib and brady arrhythmias, near arrest. He is sedated but will wake to voice, follows commands. He did not do PSV 1/14 - has been less stable when adopting the work to breathe. While I am concerned that his cardiac disease may limit his ability to extubate, I believe we need to push for PSV and SBT 1/15 to determine this. He may not be able to extubate due to cardiac ischemia. His hematuria is improved. We will hold off on heparin for now as he is no longer in A fib.  Independent critical care time is 35 minutes.  Baltazar Apo, MD, PhD 09/12/2015, 8:53 AM [facility] [phone] or if no answer [phone]

## 2015-09-12 NOTE — Progress Notes (Signed)
CRITICAL VALUE ALERT  Critical value received:  K 2.8  Date of notification:  09/11/14  Time of notification:  23:52  Critical value read back:No.  Nurse who received alert:  Ezekiel Slocumb, lab didn't call with value  MD notified (1st page):  Dr. Ashok Cordia  Time of first page:  23:55  Responding MD:  Dr. Ashok Cordia  Time MD responded:  00:10

## 2015-09-12 NOTE — Progress Notes (Signed)
[place] Progress Note Patient Name: Patrick Sexton [facility] DOB: 1946/07/29 MRN: [id number]   Date of Service  09/12/2015  HPI/Events of Note  Low Mg 1.8  eICU Interventions  replaced     Intervention Category Minor Interventions: Electrolytes abnormality - evaluation and management  Mauri Brooklyn, P 09/12/2015, 1:19 AM

## 2015-09-13 ENCOUNTER — Inpatient Hospital Stay (HOSPITAL_COMMUNITY): Payer: Medicare Other

## 2015-09-13 DIAGNOSIS — I5023 Acute on chronic systolic (congestive) heart failure: Secondary | ICD-10-CM

## 2015-09-13 DIAGNOSIS — I255 Ischemic cardiomyopathy: Secondary | ICD-10-CM

## 2015-09-13 LAB — BASIC METABOLIC PANEL
Anion gap: 10 (ref 5–15)
Anion gap: 12 (ref 5–15)
BUN: 42 mg/dL — ABNORMAL HIGH (ref 6–20)
BUN: 41 mg/dL — AB (ref 6–20)
CALCIUM: 7.7 mg/dL — AB (ref 8.9–10.3)
CHLORIDE: 107 mmol/L (ref 101–111)
CO2: 32 mmol/L (ref 22–32)
CO2: 33 mmol/L — ABNORMAL HIGH (ref 22–32)
CREATININE: 1.2 mg/dL (ref 0.61–1.24)
Calcium: 7.5 mg/dL — ABNORMAL LOW (ref 8.9–10.3)
Chloride: 111 mmol/L (ref 101–111)
Creatinine, Ser: 1.27 mg/dL — ABNORMAL HIGH (ref 0.61–1.24)
GFR calc Af Amer: >60 mL/min (ref >60)
GFR calc non Af Amer: 60 mL/min — ABNORMAL LOW (ref >60)
GFR calc non Af Amer: 56 mL/min — ABNORMAL LOW (ref >60)
Glucose, Bld: 165 mg/dL — ABNORMAL HIGH (ref 65–99)
Glucose, Bld: 162 mg/dL — ABNORMAL HIGH (ref 65–99)
POTASSIUM: 3.9 mmol/L (ref 3.5–5.1)
Potassium: 3.1 mmol/L — ABNORMAL LOW (ref 3.5–5.1)
SODIUM: 153 mmol/L — AB (ref 135–145)
SODIUM: 152 mmol/L — AB (ref 135–145)

## 2015-09-13 LAB — CARBOXYHEMOGLOBIN
Carboxyhemoglobin: 1.5 % (ref 0.5–1.5)
Methemoglobin: 0.7 % (ref 0.0–1.5)
O2 Saturation: 64.3 %
TOTAL HEMOGLOBIN: 8.6 g/dL — AB (ref 13.5–18.0)

## 2015-09-13 LAB — PHOSPHORUS: PHOSPHORUS: 4.2 mg/dL (ref 2.5–4.6)

## 2015-09-13 LAB — GLUCOSE, CAPILLARY
GLUCOSE-CAPILLARY: 159 mg/dL — AB (ref 65–99)
GLUCOSE-CAPILLARY: 135 mg/dL — AB (ref 65–99)
GLUCOSE-CAPILLARY: 142 mg/dL — AB (ref 65–99)
Glucose-Capillary: 113 mg/dL — ABNORMAL HIGH (ref 65–99)
Glucose-Capillary: 149 mg/dL — ABNORMAL HIGH (ref 65–99)

## 2015-09-13 LAB — CBC
HEMATOCRIT: 28.2 % — AB (ref 39.0–52.0)
HEMOGLOBIN: 8.3 g/dL — AB (ref 13.0–17.0)
MCH: 24.4 pg — ABNORMAL LOW (ref 26.0–34.0)
MCHC: 29.4 g/dL — AB (ref 30.0–36.0)
MCV: 82.9 fL (ref 78.0–100.0)
Platelets: 273 10*3/uL (ref 150–400)
RBC: 3.4 MIL/uL — AB (ref 4.22–5.81)
RDW: 17 % — ABNORMAL HIGH (ref 11.5–15.5)
WBC: 14.1 10*3/uL — AB (ref 4.0–10.5)

## 2015-09-13 LAB — MAGNESIUM: Magnesium: 2.4 mg/dL (ref 1.7–2.4)

## 2015-09-13 MED ORDER — POTASSIUM CHLORIDE 20 MEQ/15ML (10%) PO SOLN
60.0000 meq | Freq: Once | ORAL | Status: AC
  Administered 2015-09-13: 60 meq
  Filled 2015-09-13: qty 45

## 2015-09-13 MED ORDER — FUROSEMIDE 10 MG/ML IJ SOLN
80.0000 mg | Freq: Two times a day (BID) | INTRAMUSCULAR | Status: DC
  Administered 2015-09-13 – 2015-09-14 (×2): 80 mg via INTRAVENOUS
  Filled 2015-09-13 (×2): qty 8

## 2015-09-13 MED ORDER — DIGOXIN 125 MCG PO TABS
0.1250 mg | ORAL_TABLET | Freq: Every day | ORAL | Status: DC
  Administered 2015-09-13 – 2015-09-21 (×9): 0.125 mg
  Filled 2015-09-13 (×9): qty 1

## 2015-09-13 MED ORDER — DEXMEDETOMIDINE HCL IN NACL 400 MCG/100ML IV SOLN
0.4000 ug/kg/h | INTRAVENOUS | Status: DC
  Administered 2015-09-13 – 2015-09-14 (×6): 0.4 ug/kg/h via INTRAVENOUS
  Administered 2015-09-15: 0.8 ug/kg/h via INTRAVENOUS
  Administered 2015-09-15 (×4): 1 ug/kg/h via INTRAVENOUS
  Administered 2015-09-15: 0.4 ug/kg/h via INTRAVENOUS
  Administered 2015-09-15: 0.8 ug/kg/h via INTRAVENOUS
  Administered 2015-09-15: 1 ug/kg/h via INTRAVENOUS
  Administered 2015-09-16: 0.8 ug/kg/h via INTRAVENOUS
  Administered 2015-09-16 (×4): 1 ug/kg/h via INTRAVENOUS
  Administered 2015-09-17 (×2): 1.1 ug/kg/h via INTRAVENOUS
  Administered 2015-09-17: 1.2 ug/kg/h via INTRAVENOUS
  Administered 2015-09-17 (×2): 1.1 ug/kg/h via INTRAVENOUS
  Administered 2015-09-18: 1.5 ug/kg/h via INTRAVENOUS
  Administered 2015-09-18: 1.8 ug/kg/h via INTRAVENOUS
  Administered 2015-09-18 (×2): 1.2 ug/kg/h via INTRAVENOUS
  Filled 2015-09-13 (×3): qty 100
  Filled 2015-09-13 (×2): qty 50
  Filled 2015-09-13: qty 100
  Filled 2015-09-13: qty 50
  Filled 2015-09-13 (×4): qty 100
  Filled 2015-09-13 (×3): qty 50
  Filled 2015-09-13: qty 100
  Filled 2015-09-13: qty 50
  Filled 2015-09-13: qty 100
  Filled 2015-09-13: qty 50
  Filled 2015-09-13: qty 100
  Filled 2015-09-13: qty 50
  Filled 2015-09-13: qty 100
  Filled 2015-09-13: qty 50
  Filled 2015-09-13 (×2): qty 100
  Filled 2015-09-13 (×4): qty 50

## 2015-09-13 MED ORDER — MIDAZOLAM HCL 2 MG/2ML IJ SOLN
INTRAMUSCULAR | Status: AC
  Filled 2015-09-13: qty 2

## 2015-09-13 MED ORDER — POTASSIUM CHLORIDE 20 MEQ/15ML (10%) PO SOLN
60.0000 meq | Freq: Once | ORAL | Status: AC
  Administered 2015-09-13: 60 meq

## 2015-09-13 MED ORDER — POTASSIUM CHLORIDE 10 MEQ/50ML IV SOLN
10.0000 meq | INTRAVENOUS | Status: AC
  Administered 2015-09-13 (×2): 10 meq via INTRAVENOUS
  Filled 2015-09-13 (×2): qty 50

## 2015-09-13 MED ORDER — ACETAMINOPHEN 325 MG PO TABS
650.0000 mg | ORAL_TABLET | ORAL | Status: DC | PRN
  Administered 2015-09-13 – 2015-09-19 (×8): 650 mg via ORAL
  Filled 2015-09-13 (×8): qty 2

## 2015-09-13 MED ORDER — MIDAZOLAM HCL 2 MG/2ML IJ SOLN
1.0000 mg | INTRAMUSCULAR | Status: DC | PRN
  Administered 2015-09-13 (×2): 2 mg via INTRAVENOUS
  Filled 2015-09-13 (×2): qty 2

## 2015-09-13 MED ORDER — POTASSIUM CHLORIDE CRYS ER 20 MEQ PO TBCR
40.0000 meq | EXTENDED_RELEASE_TABLET | Freq: Once | ORAL | Status: AC
  Administered 2015-09-13: 40 meq via ORAL
  Filled 2015-09-13: qty 2

## 2015-09-13 MED ORDER — FREE WATER
300.0000 mL | Freq: Four times a day (QID) | Status: DC
  Administered 2015-09-13 – 2015-09-18 (×20): 300 mL

## 2015-09-13 MED ORDER — AMIODARONE LOAD VIA INFUSION
150.0000 mg | Freq: Once | INTRAVENOUS | Status: AC
  Administered 2015-09-13: 150 mg via INTRAVENOUS
  Filled 2015-09-13: qty 83.34

## 2015-09-13 MED ORDER — AMIODARONE HCL IN DEXTROSE 360-4.14 MG/200ML-% IV SOLN
30.0000 mg/h | INTRAVENOUS | Status: DC
  Administered 2015-09-13 – 2015-09-21 (×16): 30 mg/h via INTRAVENOUS
  Filled 2015-09-13 (×16): qty 200

## 2015-09-13 MED ORDER — AMIODARONE HCL IN DEXTROSE 360-4.14 MG/200ML-% IV SOLN
60.0000 mg/h | INTRAVENOUS | Status: AC
  Administered 2015-09-13 (×2): 60 mg/h via INTRAVENOUS
  Filled 2015-09-13: qty 200

## 2015-09-13 MED ORDER — AMIODARONE HCL IN DEXTROSE 360-4.14 MG/200ML-% IV SOLN
INTRAVENOUS | Status: AC
  Filled 2015-09-13: qty 200

## 2015-09-13 MED ORDER — POTASSIUM CHLORIDE 20 MEQ/15ML (10%) PO SOLN
ORAL | Status: AC
  Filled 2015-09-13: qty 30

## 2015-09-13 NOTE — Progress Notes (Signed)
PULMONARY / CRITICAL CARE MEDICINE   Name: Patrick Sexton [facility] MRN: [id number] DOB: [birth year]    ADMISSION DATE:  09/17/2015  REFERRING MD:  EDP  CHIEF COMPLAINT:  Cardiac arrest    SUBJECTIVE:  Fevers overnight. Patient with agitation this AM during [employer]. Follows commands.    VITAL SIGNS: BP 106/62 mmHg  Pulse 70  Temp(Src) 101.2 F (38.4 C) (Oral)  Resp 16  Ht 5\' 9"  (1.753 m)  Wt 174 lb 13.2 oz (79.3 kg)  BMI 25.81 kg/m2  SpO2 99%  HEMODYNAMICS: CVP:  [9 mmHg-11 mmHg] 9 mmHg  VENTILATOR SETTINGS: Vent Mode:  [-] PRVC FiO2 (%):  [30 %-40 %] 30 % Set Rate:  [18 bmp] 18 bmp Vt Set:  [550 mL] 550 mL PEEP:  [5 cmH20] 5 cmH20 Pressure Support:  [10 cmH20] 10 cmH20 Plateau Pressure:  [19 cmH20-22 cmH20] 22 cmH20  INTAKE / OUTPUT: I/O last 3 completed shifts: In: 3353.1 [I.V.:1532.1; NG/GT:1171; IV Piggyback:650] Out: 6630 [id number]  PHYSICAL EXAMINATION: General: Intubated, slightly agitated on vent this AM.  Neuro: follows commands, generalized weakness but moves all extremities   HEENT: PERRL, mm pink/moist Cardiovascular:  Tachycardic, irregular Lungs: even/non-labored, lungs bilaterally coarse  Abdomen:  Soft + bs, no r/g Musculoskeletal: intact Skin: cool  LABS:  BMET  Recent Labs Lab 09/11/15 2315 09/12/15 1048 09/12/15 2237  NA 148* 149* 150*  K 4.2 3.2* 2.8*  CL 106 102 104  CO2 31 33* 34*  BUN 40* 42* 45*  CREATININE 1.21 1.21 1.37*  GLUCOSE 154* 136* 129*    Electrolytes  Recent Labs Lab 09/11/15 2315 09/12/15 1048 09/12/15 2237  CALCIUM 6.9* 7.7* 7.6*  MG 1.8 2.5* 2.2  PHOS 3.2 4.3 3.7    CBC  Recent Labs Lab 09/11/15 0550 09/12/15 0421 09/13/15 0059  WBC 14.6* 14.1* 14.1*  HGB 8.4* 8.2* 8.3*  HCT 26.9* 26.7* 28.2*  PLT 196 217 273    Coag's  Recent Labs Lab 09/09/15 0016  APTT 115*    ABG  Recent Labs Lab 09/07/15 0355 09/07/15 0631 09/10/15 2049  PHART 7.491* 7.439 7.504*  PCO2ART 30.2* 35.6 34.0*   PO2ART 118* 80.0 59.0*    Liver Enzymes  Recent Labs Lab 09/07/15 0437 09/08/15 0345 09/09/15 0230  AST 161* 98* 61*  ALT 153* 113* 83*  ALKPHOS 71 114 137*  BILITOT 2.2* 1.7* 1.4*  ALBUMIN 2.2* 2.1* 2.1*    Glucose  Recent Labs Lab 09/12/15 0819 09/12/15 1141 09/12/15 1629 09/12/15 2021 09/12/15 2344 09/13/15 0324  GLUCAP 134* 113* 103* 125* 131* 113*    Imaging Dg Chest Port 1 View  09/13/2015  CLINICAL DATA:  Acute respiratory failure. EXAM: PORTABLE CHEST 1 VIEW COMPARISON:  09/12/2015. FINDINGS: Endotracheal tube, NG tube, left IJ line stable position. Prior CABG. Cardiomegaly with diffuse bilateral pulmonary infiltrates. No interim change. No pleural effusion or pneumothorax. IMPRESSION: 1. Lines and tubes in stable position. 2. Prior CABG.  Persistent cardiomegaly. 3. Persistent bilateral airspace disease/pulmonary edema. No interim change . Electronically Signed   By: Marcello Moores  Register   On: 09/13/2015 07:10     STUDIES:  CT head 1/7 >> mild atrophy LHC 1/7 >> no intervention but severe LV dysfunction and severe native and graft disease EEG 1/9 >> Abnormal with moderate generalized nonspecific continuous slowing of cerebral activity, no epileptic disorder  CULTURES: 1/7 MRSA >> NEG 1/7 Urine Cx >> NEG 1/7 Blood Cx >> NEG  1/7 Tracheal Cx >> ? If done >> 1/16 Blood >>  1/16 Respiratory >> 1/16 Urine >>  ANTIBIOTICS: Vancomycin 1/9 >> 1/11 Zosyn 1/9 >> 1/11 unasyn 1/11 >> 1/15  SIGNIFICANT EVENTS: 01/07  cardiac arrest 01/08  shock 01/09  rewarm 01/10  follows commands 01/11  dnr established 01/16  fevers overnight   LINES/TUBES: 1/7 ETT >> 1/7 Left IJ >>  1/7 Rt rad a line >> 1/12  DISCUSSION: 70 y/o with hx severe CAD who was shoveling snow and collapsed, 15 min Vfib arrest. Cath no culprit, medical mgmt. Difficult weaning, frequent PVC's, bradycardia, AF   ASSESSMENT / PLAN:  PULMONARY A: VDRF s/p Cardiac Arrest Suspected  aspiration P:   Continue full vent support, arrhythmia with weaning. Wean PEEP / FiO2 for sats > 92%. Daily SBT / WUA  Intermittent CXR Spoke with wife, will need trach, wife to decide. Hold further diureses at this point.  CARDIOVASCULAR A:  Post cardiac arrest while shoveling snow. CPR 10 min epi x 1 LHC with severe native and bypass disease without clear culprit for infarct Hx CAD s/p CABG  CHF Shock (cardiogenic) - resolved Relative AI New Paroxsymal AF - 1/11 Severe CAD - no intervention options, medical management  P:  ASA  Continue stress dose steroids D/C Lasix. Continue Plavix  Hold off on anticoagulation for now Restart amiodarone.  RENAL A:   AKI Hypokalemia Hypernatremia P:   Trend BMP / UOP  Replace electrolytes as indicated   D/C lasix. Even goals  GASTROINTESTINAL A:  Elevated transaminases - likely d/t shock liver  GI protection P:   PPI Continue TF per protocol  Reglan 5 mg IV q6h  HEMATOLOGIC A:   Anticoagulation per cardiology  Leukocytosis Hematuria, trauma? P:  Hold heparin gtt with bleeding Trend CBC  Monitor urethral bleeding, improved 1/15  INFECTIOUS A:   Aspiration; completed full course of ABx Fever P:   Monitor fever curve / WBC  Recultured overnight, will continue to follow  ENDOCRINE A:   Hyperglycemia Sick euthyroid P:   SSI Lantus 10 units Q24 Repeat TSH 4-6 weeks  NEUROLOGIC A:   Currently sedated post intubation following cardiac arrest while shoveling snow At risk anoxia - EEG abnormal showing moderate generalized nonspecific slowing  P:   Fentanyl gtt for pain D/C propofol. PRN versed ordered. RASS goal: 0 Daily WUA / SBT  [name], MD PGY-3, Internal Medicine Pager: [phone]  Attending Note:  70 year old male who was shoveling snow and suffered a VF cardiac arrest, following commands but every time weaning was attempted, the patient HR increased to the 170's.  On exam, he is answering  questions and following commands with crackles on chest exam.  Patient becomes very agitated.  I reviewed CXR myself, ETT ok with bilateral pulmonary edema.  Will hold lasix for today given renal function, start free water and will likely add lasix at a lower dose in AM.  Spoke with wife, patient needs a tracheostomy if that is the way she would like to proceed.  She is currently discussing that with family and will let us know in AM whether or not to proceed with tracheosomy.  The patient is critically ill with multiple organ systems failure and requires high complexity decision making for assessment and support, frequent evaluation and titration of therapies, application of advanced monitoring technologies and extensive interpretation of multiple databases.   Critical Care Time devoted to patient care services described in this note is  35  Minutes. This time reflects time of care of this signee Dr  Jennet Maduro. This critical care time does not reflect procedure time, or teaching time or supervisory time of PA/NP/Med student/Med Resident etc but could involve care discussion time.  Rush Farmer, M.D. [facility] Pulmonary/Critical Care Medicine. Pager: [phone]. After hours pager: [phone].

## 2015-09-13 NOTE — Progress Notes (Signed)
Sputum sent to the LAB, RN aware

## 2015-09-13 NOTE — Progress Notes (Signed)
[place] Progress Note Patient Name: Patrick Sexton [facility] DOB: [birth year] MRN: [id number]   Date of Service  09/13/2015  HPI/Events of Note  RN notified of fever & short runs of wide complex tachycardia. K 2.8 & magnesium >2.0. Previously completed vanc & zosyn w/o recent cultures.  eICU Interventions  1. Tylenol prn fever 2. Blood, Tracheal Aspirate, & Urine cultures 3. KCl 60 mEq VT & 70mEq IV 4. Monitor on telemetry 5. Holding off on antibiotics for now     Intervention Category Major Interventions: Arrhythmia - evaluation and management  Tera Partridge 09/13/2015, 12:18 AM

## 2015-09-13 NOTE — Progress Notes (Signed)
Sputum collected

## 2015-09-13 NOTE — Progress Notes (Signed)
  Echocardiogram 2D Echocardiogram has been performed.  Patrick Sexton M 09/13/2015, 3:11 PM

## 2015-09-13 NOTE — Consult Note (Signed)
Advanced Heart Failure Consult Note  Referring MD: Dr Elsworth Soho PCP: Patrick Ginsberg, PA Primary Cardiologist: Dr Clayborn Bigness  Subjective:    Patrick Sexton [facility] is a 70 y.o. male with history of CAD s/p CABG x 4 2008, PAF, HTN, Ischemic cardiomyopathy Echo 03/2015 EF 45-50%, and dyslipidemia followed by Dr Patrick Sexton who presented to ER on 09/28/2015 after resuscitation from Cardiac arrest. He was shoveling snow and developed SOB then collapsed when EMS was called. CPR x 10-15 minutes with Vfib, 2 rounds epi and deibrillated to Afib. Cooling started on arrival to ER.   Went for emergent cath 09/08/2015 with severe native and bypass graft disease with no clear culprit for acute infarct. LIMA to LAD is patent and exhibits reduced flow. All other vessels totally occlude or high-grade stenosis (>95%). Vfib in cath lab x 2 with successful defib.   Intubated/Sedated 09/03/2015.Required levophed and vasopressin for pressor support and IV amio/heparin.  Rewarming initiated 09/06/15. Began to track and follow simple commands. Cardiology s/o 09/08/15. HF team asked to see 09/13/15  SIGNIFICANT EVENTS: 01/07 cardiac arrest/intubated/sedated/emergent cath 01/08 shock 01/09 rewarm 01/10 follows commands 01/11 dnr established, [facility] signed off 01/16 fevers overnight, HF team asked to see  STUDIES:  CT head 1/7>> mild atrophy LHC 1/7>> no intervention but severe LV dysfunction and severe native and graft disease CXR 1/9>> EEG 1/9>>Abnormal with moderate generalized nonspecific continuous slowing of cerebral activity, no epileptic disorder  He remains intubated and intermittently follows commands.  Afebrile overnight.  Wife wants to know if anything else can be done.  She wants to know if there would be any advanced options available (such as transplant) to help his heart if tracheostomy were placed.  She does not think she wants him to go into a nursing home with a trach if there is little to no change of him regaining quality of  life.  They had not discussed end of life wishes before, and she agrees to speak to palliative care.   Objective:   Weight Range: 174 lb 13.2 oz (79.3 kg) Body mass index is 25.81 kg/(m^2).   Vital Signs:   Temp:  [97.3 F (36.3 C)-102.6 F (39.2 C)] 98.3 F (36.8 C) (01/16 1100) Pulse Rate:  [64-115] 115 (01/16 1155) Resp:  [16-27] 27 (01/16 1155) BP: (97-148)/(57-96) 129/79 mmHg (01/16 1155) SpO2:  [93 %-100 %] 95 % (01/16 1155) FiO2 (%):  [30 %-40 %] 40 % (01/16 1155) Weight:  [174 lb 13.2 oz (79.3 kg)] 174 lb 13.2 oz (79.3 kg) (01/16 0320) Last BM Date:  (pta)  Weight change: Filed Weights   09/11/15 0500 09/12/15 0404 09/13/15 0320  Weight: 188 lb 0.8 oz (85.3 kg) 179 lb 0.2 oz (81.2 kg) 174 lb 13.2 oz (79.3 kg)    Intake/Output:   Intake/Output Summary (Last 24 hours) at 09/13/15 1208 Last data filed at 09/13/15 1100  Gross per 24 hour  Intake 2670.92 ml  Output   3625 ml  Net -954.08 ml     Physical Exam: General: Chronically ill appearing.  HEENT: Intubated, remains on vent.  Neck: supple. JVP to jaw. Carotids 2+ bilat; no bruits. No thyromegaly or nodule noted. Cor: PMI nondisplaced. Tachy, irregular. Lungs: Rhoncus throughout. Abdomen: soft, ND, no HSM. No bruits or masses. +BS  Extremities: no cyanosis, clubbing, rash. 1+ ankle edema. Neuro: Intubated, sedated on fentanyl currently. Intermittently follows commands.   Telemetry: Reviewed personally, Afib with LBBB 110-120s, rate up into 150s at time.  Labs: CBC  Recent Labs  09/12/15 0421 09/13/15 0059  WBC 14.1* 14.1*  HGB 8.2* 8.3*  HCT 26.7* 28.2*  MCV 82.4 82.9  PLT 217 [id number]   Basic Metabolic Panel  Recent Labs  09/12/15 1048 09/12/15 2237 09/13/15 0855  NA 149* 150* 153*  K 3.2* 2.8* 3.1*  CL 102 104 111  CO2 33* 34* 32  GLUCOSE 136* 129* 165*  BUN 42* 45* 42*  CREATININE 1.21 1.37* 1.20  CALCIUM 7.7* 7.6* 7.7*  MG 2.5* 2.2 2.4  PHOS 4.3 3.7  --    Liver Function Tests No  results for input(s): AST, ALT, ALKPHOS, BILITOT, PROT, ALBUMIN in the last 72 hours. No results for input(s): LIPASE, AMYLASE in the last 72 hours. Cardiac Enzymes No results for input(s): CKTOTAL, CKMB, CKMBINDEX, TROPONINI in the last 72 hours.  BNP: BNP (last 3 results)  Recent Labs  09/13/2015 1520 09/07/15 1057  BNP 235.5* 670.0*    ProBNP (last 3 results) No results for input(s): PROBNP in the last 8760 hours.   D-Dimer No results for input(s): DDIMER in the last 72 hours. Hemoglobin A1C No results for input(s): HGBA1C in the last 72 hours. Fasting Lipid Panel  Recent Labs  09/12/15 1049  TRIG 172*   Thyroid Function Tests No results for input(s): TSH, T4TOTAL, T3FREE, THYROIDAB in the last 72 hours.  Invalid input(s): FREET3  Other results:     Imaging/Studies:  Dg Chest Port 1 View  09/13/2015  CLINICAL DATA:  Acute respiratory failure. EXAM: PORTABLE CHEST 1 VIEW COMPARISON:  09/12/2015. FINDINGS: Endotracheal tube, NG tube, left IJ line stable position. Prior CABG. Cardiomegaly with diffuse bilateral pulmonary infiltrates. No interim change. No pleural effusion or pneumothorax. IMPRESSION: 1. Lines and tubes in stable position. 2. Prior CABG.  Persistent cardiomegaly. 3. Persistent bilateral airspace disease/pulmonary edema. No interim change . Electronically Signed   By: Marcello Moores  Register   On: 09/13/2015 07:10   Dg Chest Port 1 View  09/12/2015  CLINICAL DATA:  Acute respiratory failure. Shortness of breath. Acute cardiac arrest and left bundle branch block. Coronary artery disease. EXAM: PORTABLE CHEST 1 VIEW COMPARISON:  09/11/2015 FINDINGS: Support lines and tubes in appropriate position. Moderate cardiomegaly stable. Patient has undergone previous CABG. Heterogeneous bilateral airspace disease is again seen, which shows mild improvement since previous study. No pneumothorax visualized. IMPRESSION: Mild improvement in diffuse heterogeneous bilateral airspace  disease since prior study. Electronically Signed   By: Earle Gell M.D.   On: 09/12/2015 08:10   Dg Abd Portable 1v  09/12/2015  CLINICAL DATA:  Orogastric tube placement. EXAM: PORTABLE ABDOMEN - 1 VIEW COMPARISON:  09/14/2015. FINDINGS: Orogastric tube tip lies in the stomach along the greater curvature. Gas pattern nonobstructive. IMPRESSION: Orogastric tube tip lies in the stomach. Electronically Signed   By: Staci Righter M.D.   On: 09/12/2015 07:09     Latest Echo  Latest Cath   Medications:     Scheduled Medications: . albuterol  2.5 mg Nebulization Q12H  . amiodarone      . antiseptic oral rinse  7 mL Mouth Rinse 10 times per day  . aspirin  81 mg Oral Daily  . atorvastatin  80 mg Oral q1800  . chlorhexidine gluconate  15 mL Mouth Rinse BID  . clopidogrel  75 mg Oral Daily  . free water  300 mL Per Tube Q6H  . hydrocortisone sod succinate (SOLU-CORTEF) inj  50 mg Intravenous Q6H  . insulin aspart  2-6 Units Subcutaneous 6 times per day  .  insulin glargine  10 Units Subcutaneous Q24H  . metoCLOPramide (REGLAN) injection  5 mg Intravenous 4 times per day  . pantoprazole sodium  40 mg Per Tube Daily  . polyethylene glycol  17 g Per Tube Daily  . potassium chloride      . senna-docusate  1 tablet Per Tube QHS  . sodium chloride  10-40 mL Intracatheter Q12H  . sodium chloride  3 mL Intravenous Q12H     Infusions: . sodium chloride 10 mL/hr at 09/11/15 0700  . amiodarone 60 mg/hr (09/13/15 1017)   Followed by  . amiodarone    . feeding supplement (VITAL AF 1.2 CAL) 1,000 mL (09/12/15 2000)  . fentaNYL infusion INTRAVENOUS 400 mcg/hr (09/13/15 1111)     PRN Medications:  sodium chloride, acetaminophen, fentaNYL, midazolam, ondansetron (ZOFRAN) IV, pneumococcal 23 valent vaccine, sodium chloride, sodium chloride   Assessment/Plan   Patrick Sexton is a 70 y.o. male with history of CAD s/p CABG x 4 2008, PAF, HTN, Ischemic cardiomyopathy Echo 03/2015 EF 45-50%, and  dyslipidemia who [facility] after cardiac arrest. Emergent cath with severe CAD, EF 25%, and no culprit. Medical management. Remains on vent. HF team asked to see for second opinion regarding treatment of CAD (i.e. Advanced therapies) and recurrent SVT with attempts to wean vent.  1. Acute respiratory failure, VDRF - Per CCM. They suspect aspiration with patchy infiltrates. - ABX per primary. Fevers overnight. - Wife to decide on trach once weaned off vent. - Pan cultures 09/08/2015 negative, re-drawn 09/13/15 with bloody secretions and fever. 2. Severe CAD s/p cardiac arrest. CPR x 10 minutes with epi x 1. - Emergent cath 09/16/2015 with severe native and bypass disease with no clear culprit. - Not candidate for PCI or re CABG.  With the acute nature of his illness it is unlikely he would be a candidate for heart transplant, or if he would survive to that point.  He would not be a candidate for LVAD in his current condition, and it is unclear if he will recover.  - Remains on stress dose steroids. - Lasix d/c'd this am, though he remains volume overloaded.   - EEG 09/06/15 Abnormal with moderate generalized nonspecific continuous slowing of cerebral activity 3. Ischemic CM, EF 25% by cath - In setting of cardiac arrest- - lasix d/c'd this am.  Out 6.6 this admission, ~ 20 lbs. Consider adding back at 80 mg IV BID. CVP 10-11. - Need complete echo.  Will get stat coox.  4. PAF - Amio gtt resumed today. For recurrent SVTs with attempts at weaning vent. - Off heparin gtt with urethral bleeding into foley - Afib currently rates in 110-120s, LBBB - EKG this am undetermined rhyhtm, 89 bpm, though looks like at least intermittent afib. - This patients CHA2DS2-VASc Score and unadjusted Ischemic Stroke Rate (% per year) is equal to 7.2 % stroke rate/year from a score of 5 - Supp K for goal > 4.0. S/p supp per CCM. 6. Endocrine - Per CCM 7. CKD stage II - Follow cr closely. 8. Hypernatremia -  Getting free water boluses - repeat BMET this afternoon.  Length of Stay: [address]  Shirley Friar PA-C 09/13/2015, 12:08 PM  Advanced Heart Failure Team Pager [phone] (M-F; 7a - 4p)  Please contact [facility] for night-coverage after hours (4p -7a ) and weekends on [email or web address]  Patient seen with PA, agree with the above note.   1. Acute hypoxemic respiratory failure: Bilateral airspace disease,  suspect aspiration post-arrest.  Also may be pulmonary edema present, CVP 10-11 today on my measurement.  Attempted vent wean today limited by atrial fibrillation with RVR, rate up to the 170s.   - Would continue diuresis 80 mg IV bid today while replacing free water with free water boluses as ordered given hypernatremia.  I do think that there is likely still a component of pulmonary edema.  - Continue Unasyn for antibiotic coverage.  - Amiodarone gtt started, hopefully this will limit HR excursion when weaning attempted.  2. CAD: Severe native vessel disease with LIMA-LAD as the only functional graft.  Based on the appearance of the cath, his arrest may have been scar-mediated VF rather than primary plaque rupture/ACS.  No interventional or redo CABG option.   - Continue ASA 81, Plavix, atorvastatin.  3. Acute on chronic systolic CHF: LV-gram with EF 25%.  No echo this admission.  CVP 10-11, so suspect he still has some volume overload present.  BP is stable, off pressors for several days.  - Needs echo - Check co-ox now to get rough estimate of cardiac output.  - Creatinine stable, reasonable to add digoxin to help with HR control and cardiac output.  - Would continue to diurese today with Lasix 80 mg IV bid, CVP remains 10-11.  Good UOP on this regimen though sodium rising.  Would give free water boluses to help with sodium (ordered 300 cc q6 hrs).   4. Atrial fibrillation: Patient is currently in atrial fibrillation with rate around 100 bpm on amiodarone gtt.  He did have one ECG today that  looked like NSR with multifocal PACs.  When vent weaning was attempted, HR increased to 170s => I reviewed telemetry, it was atrial fibrillation.   - Started amiodarone today to help control rate.  - I will also add digoxin.  - He had bloody secretions via ET tube this morning, seems to have stopped now.  If no more bloody secretions, would start heparin gtt in the morning.  If he remains in atrial fibrillation despite amiodarone, could then consider DCCV down the road.  5. Hypernatremia: Free water boluses.  6. Palliative care service to talk with his wife today.  He is critically ill.  Aspiration PNA + volume overload/pulmonary edema are limiting his vent weaning.  If we can diurese him a bit more today and keep his HR controlled, his vent may be weanable. We'll get an idea of his cardiac output from co-ox today, but BP and creatinine are stable so I think that it likely is going to be ok.   Loralie Champagne 09/13/2015 2:46 PM

## 2015-09-14 ENCOUNTER — Inpatient Hospital Stay (HOSPITAL_COMMUNITY): Payer: Medicare Other

## 2015-09-14 ENCOUNTER — Encounter (HOSPITAL_COMMUNITY): Payer: Self-pay | Admitting: Internal Medicine

## 2015-09-14 DIAGNOSIS — Z66 Do not resuscitate: Secondary | ICD-10-CM

## 2015-09-14 DIAGNOSIS — Z515 Encounter for palliative care: Secondary | ICD-10-CM

## 2015-09-14 DIAGNOSIS — I4891 Unspecified atrial fibrillation: Secondary | ICD-10-CM

## 2015-09-14 LAB — URINALYSIS, ROUTINE W REFLEX MICROSCOPIC
BILIRUBIN URINE: NEGATIVE
Glucose, UA: NEGATIVE mg/dL
KETONES UR: NEGATIVE mg/dL
Leukocytes, UA: NEGATIVE
Nitrite: NEGATIVE
PH: 6 (ref 5.0–8.0)
Protein, ur: 30 mg/dL — AB
SPECIFIC GRAVITY, URINE: 1.019 (ref 1.005–1.030)

## 2015-09-14 LAB — GLUCOSE, CAPILLARY
GLUCOSE-CAPILLARY: 149 mg/dL — AB (ref 65–99)
GLUCOSE-CAPILLARY: 163 mg/dL — AB (ref 65–99)
GLUCOSE-CAPILLARY: 170 mg/dL — AB (ref 65–99)
Glucose-Capillary: 179 mg/dL — ABNORMAL HIGH (ref 65–99)
Glucose-Capillary: 161 mg/dL — ABNORMAL HIGH (ref 65–99)
Glucose-Capillary: 170 mg/dL — ABNORMAL HIGH (ref 65–99)

## 2015-09-14 LAB — BLOOD GAS, ARTERIAL
Acid-Base Excess: 7.5 mmol/L — ABNORMAL HIGH (ref 0.0–2.0)
Bicarbonate: 30.7 mEq/L — ABNORMAL HIGH (ref 20.0–24.0)
DRAWN BY: 345601
FIO2: 0.4
MECHVT: 550 mL
O2 Saturation: 96.9 %
PATIENT TEMPERATURE: 98.6
PEEP: 5 cmH2O
PO2 ART: 85.5 mmHg (ref 80.0–100.0)
RATE: 18 resp/min
TCO2: 31.8 mmol/L (ref 0–100)
pCO2 arterial: 37.2 mmHg (ref 35.0–45.0)
pH, Arterial: 7.527 — ABNORMAL HIGH (ref 7.350–7.450)

## 2015-09-14 LAB — CARBOXYHEMOGLOBIN
CARBOXYHEMOGLOBIN: 1.4 % (ref 0.5–1.5)
Carboxyhemoglobin: 1.7 % — ABNORMAL HIGH (ref 0.5–1.5)
Methemoglobin: 0.8 % (ref 0.0–1.5)
Methemoglobin: 0.6 % (ref 0.0–1.5)
O2 SAT: 51.4 %
O2 Saturation: 52.9 %
TOTAL HEMOGLOBIN: 8 g/dL — AB (ref 13.5–18.0)
TOTAL HEMOGLOBIN: 9.2 g/dL — AB (ref 13.5–18.0)

## 2015-09-14 LAB — BASIC METABOLIC PANEL
Anion gap: 9 (ref 5–15)
BUN: 43 mg/dL — ABNORMAL HIGH (ref 6–20)
CALCIUM: 7.3 mg/dL — AB (ref 8.9–10.3)
CHLORIDE: 109 mmol/L (ref 101–111)
CO2: 33 mmol/L — ABNORMAL HIGH (ref 22–32)
CREATININE: 1.36 mg/dL — AB (ref 0.61–1.24)
GFR, EST AFRICAN AMERICAN: 60 mL/min — AB (ref >60)
GFR, EST NON AFRICAN AMERICAN: 52 mL/min — AB (ref >60)
Glucose, Bld: 189 mg/dL — ABNORMAL HIGH (ref 65–99)
Potassium: 3.3 mmol/L — ABNORMAL LOW (ref 3.5–5.1)
SODIUM: 151 mmol/L — AB (ref 135–145)

## 2015-09-14 LAB — PHOSPHORUS: PHOSPHORUS: 3.8 mg/dL (ref 2.5–4.6)

## 2015-09-14 LAB — URINE CULTURE: CULTURE: NO GROWTH

## 2015-09-14 LAB — URINE MICROSCOPIC-ADD ON

## 2015-09-14 LAB — CBC
HEMATOCRIT: 27.1 % — AB (ref 39.0–52.0)
Hemoglobin: 8 g/dL — ABNORMAL LOW (ref 13.0–17.0)
MCH: 25.1 pg — ABNORMAL LOW (ref 26.0–34.0)
MCHC: 29.5 g/dL — AB (ref 30.0–36.0)
MCV: 85 fL (ref 78.0–100.0)
PLATELETS: 252 10*3/uL (ref 150–400)
RBC: 3.19 MIL/uL — ABNORMAL LOW (ref 4.22–5.81)
RDW: 17.4 % — AB (ref 11.5–15.5)
WBC: 15.6 10*3/uL — AB (ref 4.0–10.5)

## 2015-09-14 LAB — MAGNESIUM: MAGNESIUM: 2.2 mg/dL (ref 1.7–2.4)

## 2015-09-14 MED ORDER — DEXTROSE 5 % IV SOLN
2.0000 g | Freq: Three times a day (TID) | INTRAVENOUS | Status: DC
  Administered 2015-09-14 – 2015-09-15 (×5): 2 g via INTRAVENOUS
  Filled 2015-09-14 (×6): qty 2

## 2015-09-14 MED ORDER — VANCOMYCIN HCL IN DEXTROSE 750-5 MG/150ML-% IV SOLN
750.0000 mg | Freq: Once | INTRAVENOUS | Status: DC
  Filled 2015-09-14: qty 150

## 2015-09-14 MED ORDER — POTASSIUM CHLORIDE 20 MEQ/15ML (10%) PO SOLN
40.0000 meq | ORAL | Status: AC
  Administered 2015-09-14 (×2): 40 meq
  Filled 2015-09-14 (×2): qty 30

## 2015-09-14 MED ORDER — POTASSIUM CHLORIDE 20 MEQ/15ML (10%) PO SOLN
40.0000 meq | Freq: Once | ORAL | Status: AC
  Administered 2015-09-14: 40 meq
  Filled 2015-09-14: qty 30

## 2015-09-14 MED ORDER — FUROSEMIDE 10 MG/ML IJ SOLN: 40.0000 mg | Freq: Three times a day (TID) | INTRAMUSCULAR | Status: DC

## 2015-09-14 MED ORDER — POTASSIUM CHLORIDE 20 MEQ/15ML (10%) PO SOLN
20.0000 meq | ORAL | Status: DC
  Administered 2015-09-14: 20 meq
  Filled 2015-09-14: qty 15

## 2015-09-14 MED ORDER — FUROSEMIDE 10 MG/ML IJ SOLN
40.0000 mg | Freq: Three times a day (TID) | INTRAMUSCULAR | Status: AC
  Administered 2015-09-14 – 2015-09-15 (×2): 40 mg via INTRAVENOUS
  Filled 2015-09-14 (×2): qty 4

## 2015-09-14 MED ORDER — VITAL AF 1.2 CAL PO LIQD
1000.0000 mL | ORAL | Status: DC
  Administered 2015-09-14 – 2015-09-20 (×8): 1000 mL

## 2015-09-14 MED ORDER — METOLAZONE 2.5 MG PO TABS
2.5000 mg | ORAL_TABLET | Freq: Once | ORAL | Status: AC
Start: 2015-09-14 — End: 2015-09-14
  Administered 2015-09-14: 2.5 mg via ORAL
  Filled 2015-09-14: qty 1

## 2015-09-14 MED ORDER — VANCOMYCIN HCL IN DEXTROSE 750-5 MG/150ML-% IV SOLN
750.0000 mg | Freq: Two times a day (BID) | INTRAVENOUS | Status: DC
  Administered 2015-09-14 – 2015-09-16 (×5): 750 mg via INTRAVENOUS
  Filled 2015-09-14 (×6): qty 150

## 2015-09-14 NOTE — Progress Notes (Signed)
PULMONARY / CRITICAL CARE MEDICINE   Name: Patrick Sexton [facility] MRN: [id number] DOB: February 23, 1946    ADMISSION DATE:  09/08/2015  REFERRING MD:  EDP  CHIEF COMPLAINT:  Cardiac arrest    SUBJECTIVE:  Fever overnight, recultured, added Vanc/Ceftaz   VITAL SIGNS: BP 119/73 mmHg  Pulse 88  Temp(Src) 98.5 F (36.9 C) (Oral)  Resp 18  Ht 5\' 9"  (1.753 m)  Wt 173 lb 4.5 oz (78.6 kg)  BMI 25.58 kg/m2  SpO2 99%  HEMODYNAMICS: CVP:  [7 mmHg-10 mmHg] 7 mmHg  VENTILATOR SETTINGS: Vent Mode:  [-] PRVC FiO2 (%):  [30 %-40 %] 40 % Set Rate:  [18 bmp] 18 bmp Vt Set:  [550 mL] 550 mL PEEP:  [5 cmH20] 5 cmH20 Plateau Pressure:  [16 cmH20-24 cmH20] 24 cmH20  INTAKE / OUTPUT: I/O last 3 completed shifts: In: 5859.1 [I.V.:2519.1; NG/GT:2890; IV Piggyback:450] Out: 4800 [Urine:4800]  PHYSICAL EXAMINATION: General: Intubated, slightly agitated on vent this AM.  Neuro: Follows commands, generalized weakness but moves all extremities   HEENT: PERRL, mm pink/moist Cardiovascular:  Tachycardic, irregular Lungs: even/non-labored, lungs bilaterally coarse  Abdomen:  Soft + bs, no r/g Musculoskeletal: intact Skin: cool  LABS:  BMET  Recent Labs Lab 09/13/15 0855 09/13/15 1721 09/14/15 0511  NA 153* 152* 151*  K 3.1* 3.9 3.3*  CL 111 107 109  CO2 32 33* 33*  BUN 42* 41* 43*  CREATININE 1.20 1.27* 1.36*  GLUCOSE 165* 162* 189*    Electrolytes  Recent Labs Lab 09/12/15 2237 09/13/15 0855 09/13/15 1340 09/13/15 1721 09/14/15 0511  CALCIUM 7.6* 7.7*  --  7.5* 7.3*  MG 2.2 2.4  --   --  2.2  PHOS 3.7  --  4.2  --  3.8    CBC  Recent Labs Lab 09/12/15 0421 09/13/15 0059 09/14/15 0511  WBC 14.1* 14.1* 15.6*  HGB 8.2* 8.3* 8.0*  HCT 26.7* 28.2* 27.1*  PLT 217 273 252    Coag's  Recent Labs Lab 09/09/15 0016  APTT 115*    ABG  Recent Labs Lab 09/10/15 2049 09/14/15 0350  PHART 7.504* 7.527*  PCO2ART 34.0* 37.2  PO2ART 59.0* 85.5    Liver  Enzymes  Recent Labs Lab 09/08/15 0345 09/09/15 0230  AST 98* 61*  ALT 113* 83*  ALKPHOS 114 137*  BILITOT 1.7* 1.4*  ALBUMIN 2.1* 2.1*    Glucose  Recent Labs Lab 09/13/15 0824 09/13/15 1134 09/13/15 1755 09/13/15 2101 09/14/15 0011 09/14/15 0444  GLUCAP 159* 149* 135* 142* 149* 179*    Imaging Dg Chest Port 1 View  09/14/2015  CLINICAL DATA:  Shortness of breath. EXAM: PORTABLE CHEST 1 VIEW COMPARISON:  September 13, 2015. FINDINGS: Stable cardiomegaly. Status post coronary artery bypass graft. Endotracheal and nasogastric tubes are unchanged and in grossly good position. Left internal jugular catheter line is unchanged with distal tip in expected position of SVC. No pneumothorax or pleural effusion is noted. Stable bilateral diffuse lung opacities are noted concerning for edema or inflammation. Bony thorax is unremarkable. IMPRESSION: Stable support apparatus. Stable bilateral lung opacities concerning for edema or pneumonia. Electronically Signed   By: Marijo Conception, M.D.   On: 09/14/2015 07:32     STUDIES:  CT head 1/7 >> mild atrophy LHC 1/7 >> no intervention but severe LV dysfunction and severe native and graft disease EEG 1/9 >> Abnormal with moderate generalized nonspecific continuous slowing of cerebral activity, no epileptic disorder  CULTURES: 1/7 MRSA >> NEG 1/7 Urine Cx >>  NEG 1/7 Blood Cx >> NEG  1/7 Tracheal Cx >> ? If done >> 1/16 Blood >>  1/16 Respiratory >> 1/16 Urine >>  ANTIBIOTICS: Vancomycin 1/9 >> 1/11 Zosyn 1/9 >> 1/11 unasyn 1/11 >> 1/15 Vanc 1/17 >> Ceftaz >>  SIGNIFICANT EVENTS: 01/07  cardiac arrest 01/08  shock 01/09  rewarm 01/10  follows commands 01/11  dnr established 01/16  fevers overnight   LINES/TUBES: 1/7 ETT >> 1/7 Left IJ >>  1/7 Rt rad a line >> 1/12  DISCUSSION: 70 y/o with hx severe CAD who was shoveling snow and collapsed, 15 min Vfib arrest. Cath no culprit, medical mgmt. Difficult weaning, frequent  PVC's, bradycardia, AF with RVR.   ASSESSMENT / PLAN:  PULMONARY A: VDRF s/p Cardiac Arrest Suspected aspiration P:   Wean PEEP / FiO2 for sats > 92%. Begin PS trials. Intermittent CXR. Wife coming in at 12 noon to meet with palliative care. Decrease lasix to 40 mg IV q8 x2 doses. Decrease RR to 12.  CARDIOVASCULAR A:  Post cardiac arrest while shoveling snow. CPR 10 min epi x 1 LHC with severe native and bypass disease without clear culprit for infarct Hx CAD s/p CABG  CHF; Co-ox 53 Shock (cardiogenic) - resolved Relative AI Atrial fibrillation w/ RVR Severe CAD - no intervention options, medical management  P:  Cardiology following ASA  Continue stress dose steroids Lasix as above; also given dose of Metolazine yesterday (out 3.5 L over 24 hours) Hold Plavix for now given blood ETT secretions Continue Amio/Digoxin  RENAL A:   AKI Hypokalemia Hypernatremia; stable Metabolic Alkalosis; contraction vs GI losses P:   BMP in AM. Supplement electrolytes as indicated. Negative goals. Lasix 40 mg IV q8 x2 doses. KVO IVF.  GASTROINTESTINAL A:   GI PPx Nutrition P:   PPI Continue TF per protocol  Reglan 5 mg IV q6h  HEMATOLOGIC A:   Anticoagulation per cardiology  Leukocytosis Hematuria, trauma? P:  Hold heparin gtt with bleeding Trend CBC  Monitor urethral bleeding, improved 1/15  INFECTIOUS A:   Aspiration Pneumonia Fever P:   Restarted Vanc/Ceftaz overnight Follow cultures  ENDOCRINE A:   Hyperglycemia Sick euthyroid P:   SSI Lantus 10 units Q24 Repeat TSH 4-6 weeks  NEUROLOGIC A:   Acute Encephalopathy EEG abnormal showing moderate generalized nonspecific slowing  P:   RASS goal: 0 Fentanyl gtt for pain Versed prn Daily WUA    [name], MD PGY-3, Internal Medicine Pager: [phone]  Attending Note:  70 year old male who had a cardiac arrest shoveling snow, cath with severe non-operable CAD.  Patient in heart failure and  cardiogenic shock that is improving.  Overnight started on vanc/ceftaz for aspiration PNA and fever.  There is nothing that can be done from a cardiac standpoint.  Ideally would like to get him to dry weight then extubate with no intention to reintubate.  Would not recommend trach/peg.  Wife is to meet with palliative care today.  The patient is critically ill with multiple organ systems failure and requires high complexity decision making for assessment and support, frequent evaluation and titration of therapies, application of advanced monitoring technologies and extensive interpretation of multiple databases.   Critical Care Time devoted to patient care services described in this note is  35  Minutes. This time reflects time of care of this signee Dr Jennet Maduro. This critical care time does not reflect procedure time, or teaching time or supervisory time of PA/NP/Med student/Med Resident etc but could  involve care discussion time.  Rush Farmer, M.D. [facility] Pulmonary/Critical Care Medicine. Pager: [phone]. After hours pager: [phone].

## 2015-09-14 NOTE — Progress Notes (Addendum)
Advanced Heart Failure Consult Note  Referring MD: Dr Elsworth Soho PCP: Carmon Ginsberg, PA Primary Cardiologist: Dr Clayborn Bigness  Subjective:    SIGNIFICANT EVENTS: 01/07 cardiac arrest/intubated/sedated/emergent cath 01/08 shock 01/09 rewarm 01/10 follows commands 01/11 dnr established, [facility] signed off 01/16 fevers overnight, BCx sent. HF team asked to see. Fever spike 103, UA sent.  STUDIES:  CT head 1/7>> mild atrophy LHC 1/7>> no intervention but severe LV dysfunction and severe native and graft disease CXR 1/9>> EEG 1/9>>Abnormal with moderate generalized nonspecific continuous slowing of cerebral activity, no epileptic disorder Echo 09/13/15>> LVEF 30-35%, mildly reduce RV, mild MR, mod TR, PA peak pressure 60 mm Hg  Coox 51.4%. I/O positive despite lasix 80 mg IV BID yesterday. Creatinine trending up. 1.2 => 1.27 => 1.36. Remains intubated with intermittent command following on fentanyl gtt.  No further bloody secretions (tube or foley).    Objective:   Weight Range: 173 lb 4.5 oz (78.6 kg) Body mass index is 25.58 kg/(m^2).   Vital Signs:   Temp:  [97.3 F (36.3 C)-103.1 F (39.5 C)] 98.8 F (37.1 C) (01/17 0400) Pulse Rate:  [67-123] 77 (01/17 0600) Resp:  [0-33] 0 (01/17 0600) BP: (82-148)/(51-90) 99/61 mmHg (01/17 0600) SpO2:  [93 %-100 %] 99 % (01/17 0600) FiO2 (%):  [30 %-40 %] 40 % (01/17 0400) Weight:  [173 lb 4.5 oz (78.6 kg)] 173 lb 4.5 oz (78.6 kg) (01/17 0500) Last BM Date: 09/13/15  Weight change: Filed Weights   09/12/15 0404 09/13/15 0320 09/14/15 0500  Weight: 179 lb 0.2 oz (81.2 kg) 174 lb 13.2 oz (79.3 kg) 173 lb 4.5 oz (78.6 kg)    Intake/Output:   Intake/Output Summary (Last 24 hours) at 09/14/15 0739 Last data filed at 09/14/15 0600  Gross per 24 hour  Intake 4238.06 ml  Output   3500 ml  Net 738.06 ml     Physical Exam: General: Chronically ill appearing.  HEENT: Intubated, remains on vent.  Neck: supple. JVP elevated jaw.  Carotids 2+ bilat; no bruits. No thyromegaly or nodule noted. Cor: PMI nondisplaced. irregular. Lungs: Diffuse rhonchi  Abdomen: soft, ND, no HSM. No bruits or masses. +BS  Extremities: no cyanosis, clubbing, rash. 1+ ankle edema. Neuro: Intubated, sedated on fentanyl currently. Intermittently follows commands.   Telemetry: Reviewed personally, Afib with LBBB in 80s  Labs: CBC  Recent Labs  09/13/15 0059 09/14/15 0511  WBC 14.1* 15.6*  HGB 8.3* 8.0*  HCT 28.2* 27.1*  MCV 82.9 85.0  PLT 273 [id number]   Basic Metabolic Panel  Recent Labs  09/13/15 0855 09/13/15 1340 09/13/15 1721 09/14/15 0511  NA 153*  --  152* 151*  K 3.1*  --  3.9 3.3*  CL 111  --  107 109  CO2 32  --  33* 33*  GLUCOSE 165*  --  162* 189*  BUN 42*  --  41* 43*  CREATININE 1.20  --  1.27* 1.36*  CALCIUM 7.7*  --  7.5* 7.3*  MG 2.4  --   --  2.2  PHOS  --  4.2  --  3.8   Liver Function Tests No results for input(s): AST, ALT, ALKPHOS, BILITOT, PROT, ALBUMIN in the last 72 hours. No results for input(s): LIPASE, AMYLASE in the last 72 hours. Cardiac Enzymes No results for input(s): CKTOTAL, CKMB, CKMBINDEX, TROPONINI in the last 72 hours.  BNP: BNP (last 3 results)  Recent Labs  09/06/2015 1520 09/07/15 1057  BNP 235.5* 670.0*    ProBNP (last  3 results) No results for input(s): PROBNP in the last 8760 hours.   D-Dimer No results for input(s): DDIMER in the last 72 hours. Hemoglobin A1C No results for input(s): HGBA1C in the last 72 hours. Fasting Lipid Panel  Recent Labs  09/12/15 1049  TRIG 172*   Thyroid Function Tests No results for input(s): TSH, T4TOTAL, T3FREE, THYROIDAB in the last 72 hours.  Invalid input(s): FREET3  Other results:     Imaging/Studies:  Dg Chest Port 1 View  09/14/2015  CLINICAL DATA:  Shortness of breath. EXAM: PORTABLE CHEST 1 VIEW COMPARISON:  September 13, 2015. FINDINGS: Stable cardiomegaly. Status post coronary artery bypass graft. Endotracheal  and nasogastric tubes are unchanged and in grossly good position. Left internal jugular catheter line is unchanged with distal tip in expected position of SVC. No pneumothorax or pleural effusion is noted. Stable bilateral diffuse lung opacities are noted concerning for edema or inflammation. Bony thorax is unremarkable. IMPRESSION: Stable support apparatus. Stable bilateral lung opacities concerning for edema or pneumonia. Electronically Signed   By: Marijo Conception, M.D.   On: 09/14/2015 07:32   Dg Chest Port 1 View  09/13/2015  CLINICAL DATA:  Acute respiratory failure. EXAM: PORTABLE CHEST 1 VIEW COMPARISON:  09/12/2015. FINDINGS: Endotracheal tube, NG tube, left IJ line stable position. Prior CABG. Cardiomegaly with diffuse bilateral pulmonary infiltrates. No interim change. No pleural effusion or pneumothorax. IMPRESSION: 1. Lines and tubes in stable position. 2. Prior CABG.  Persistent cardiomegaly. 3. Persistent bilateral airspace disease/pulmonary edema. No interim change . Electronically Signed   By: Marcello Moores  Register   On: 09/13/2015 07:10    Latest Echo  Latest Cath   Medications:     Scheduled Medications: . albuterol  2.5 mg Nebulization Q12H  . antiseptic oral rinse  7 mL Mouth Rinse 10 times per day  . aspirin  81 mg Oral Daily  . atorvastatin  80 mg Oral q1800  . cefTAZidime (FORTAZ)  IV  2 g Intravenous 3 times per day  . chlorhexidine gluconate  15 mL Mouth Rinse BID  . clopidogrel  75 mg Oral Daily  . digoxin  0.125 mg Per Tube Daily  . free water  300 mL Per Tube Q6H  . furosemide  80 mg Intravenous BID  . hydrocortisone sod succinate (SOLU-CORTEF) inj  50 mg Intravenous Q6H  . insulin aspart  2-6 Units Subcutaneous 6 times per day  . insulin glargine  10 Units Subcutaneous Q24H  . metoCLOPramide (REGLAN) injection  5 mg Intravenous 4 times per day  . pantoprazole sodium  40 mg Per Tube Daily  . polyethylene glycol  17 g Per Tube Daily  . potassium chloride  20 mEq  Per Tube Q4H  . senna-docusate  1 tablet Per Tube QHS  . sodium chloride  10-40 mL Intracatheter Q12H  . sodium chloride  3 mL Intravenous Q12H  . vancomycin  750 mg Intravenous Once  . vancomycin  750 mg Intravenous Q12H    Infusions: . sodium chloride Stopped (09/13/15 2000)  . amiodarone 30 mg/hr (09/14/15 0644)  . dexmedetomidine 0.4 mcg/kg/hr (09/14/15 0434)  . feeding supplement (VITAL AF 1.2 CAL) 1,000 mL (09/13/15 2000)  . fentaNYL infusion INTRAVENOUS 350 mcg/hr (09/14/15 0033)    PRN Medications: sodium chloride, acetaminophen, fentaNYL, midazolam, ondansetron (ZOFRAN) IV, pneumococcal 23 valent vaccine, sodium chloride, sodium chloride   Assessment/Plan   Patrick Sexton is a 70 y.o. male with history of CAD s/p CABG x 4 2008, PAF,  HTN, Ischemic cardiomyopathy Echo 03/2015 EF 45-50%, and dyslipidemia who presented to [facility] 09/20/2015 after cardiac arrest. Emergent cath with severe CAD, EF 25%, and no culprit. Medical management. Remains on vent. HF team asked to see for second opinion regarding treatment of CAD/CHF (i.e. Advanced therapies) and recurrent SVT with attempts to wean vent.  1. Acute respiratory failure, VDRF - Per CCM. They suspect aspiration with patchy infiltrates. - ABX per primary. Fevers overnight. - Concerned that he will need tracheostomy.  - Pan cultures 09/16/2015 negative, re-drawn 09/13/15 with bloody secretions and fever. 2. Severe CAD s/p cardiac arrest. CPR x 10 minutes with epi x 1. - Emergent cath 09/19/2015 with severe native and bypass disease with no clear culprit. - Not candidate for PCI or CABG redo.  - EEG 09/06/15 Abnormal with moderate generalized nonspecific continuous slowing of cerebral activity 3. Acute Systolic HF, EF [id number] by Echo, Ischemic CM - in setting of cardiac arrest - Coox 51% this am. I/O positive yesterday despite lasix 80 mg IV BID. Add metolazone 2.5 mg. Supp K.  - Echo 1/16 LVEF 30-35%, mildly reduce RV, mild MR, mod TR, PA peak  pressure 60 mm Hg 4. PAF -  Rate much improved with Amio gtt starting yesterday, suspect attempts at weaning may be more successful on continued amio.  - Off heparin gtt with urethral bleeding into foley - Afib currently rates in 80s, LBBB - CHA2DS2-VASc = 5 - Supp K for goal > 4.0.  - hgb 8.0. Could consider DCCV once more stable.  6. Endocrine - Per CCM 7.AKI on CKD stage II - Trending up. - Continue to follow closely. 8. Hypernatremia - Coming down slowly with free water boluses - Per CCM 9. ID - UA negative, few bacteria, + protein - Completed Unasyn. Added back on Ceftaz and vanc last night with fevers into 103. - WBC 14.1 => 15.6. Remains on stress dose steroids.   Length of Stay: 10  Shirley Friar PA-C 09/14/2015, 7:39 AM  Advanced Heart Failure Team Pager [phone] (M-F; 7a - 4p)  Please contact [facility] for night-coverage after hours (4p -7a ) and weekends on [email or web address]  Patient seen with PA, agree with the above note.  1. Acute hypoxemic respiratory failure: Bilateral airspace disease, suspect aspiration post-arrest. Also suspect pulmonary edema, CVP 16 today on my measurement. Vent wean yesterday limited by afib with RVR.  Now on amiodarone with controlled rate but spiked fever to 103 last night.  With fever and high CVP, suspect vent weaning would be difficult at this time.   - Needs more diuresis => good UOP yesterday with weight down 1 lb but not negative given significant fluid intake via IVs and tube. Continue Lasix 80 mg IV bid and will give dose of metolazone today.  - Antibiotic coverage with vanco/ceftazidime, recultured.  - Has been intubated for over a week, wife reluctant yesterday for tracheostomy.  With recurrent fevers, think it is going to be hard to move quickly to extubation.  As above, will try to continue aggressive diuresis today to see if this can improve situation. Will need ongoing discussions with his wife.   2. CAD: Severe  native vessel disease with LIMA-LAD as the only functional graft. Based on the appearance of the cath, his arrest may have been scar-mediated VF rather than primary plaque rupture/ACS. No interventional or redo CABG option.  - Continue ASA 81, Plavix, atorvastatin.  3. Acute on chronic systolic CHF: LV-gram with EF 25%, echo 09/13/15  with EF 30-35%.  Co-ox 64% (adequate) yesterday but lower at 51% today.  Started digoxin.  CVP 16, so suspect he has significant volume overload present. BP is stable, off pressors for several days.  - Repeat co-ox, hopefully higher this morning.  Would like to avoid milrinone if possible give atrial fibrillation, but can use if co-ox remains low. - Continue digoxin and Lasix/metolazone as above.   4. Atrial fibrillation: Patient is currently in atrial fibrillation with rate improved in the 70s on amiodarone gtt. When vent weaning was attempted yesterday, HR increased to 170s with afib/RVR but now on amiodarone.   - Continue amiodarone and digoxin for rate control. - He had bloody secretions via ET tube yesterday morning.  Hemoglobin low but stable.  No more bloody secretions. Will stop Plavix today but continue ASA.  If hemoglobin stable and no more overt bleeding, will start heparin gtt in the morning and tentatively plan TEE-guided DCCV perhaps Thursday to get him back in NSR.  5. Hypernatremia: Free water boluses continue, sodium down a bit this morning.  6. Renal: Creatinine relatively stable so far, watch carefully with diuresis.  7. ID: Recurrent fever last night to 103.  Suspect from lung source with probable aspiration PNA.  He was recultured and started on ceftazidime/vancomycin.   8. Would be reasonable to involve palliative care. He is critically ill. Aspiration PNA + volume overload/pulmonary edema are limiting his vent weaning. If we can diurese him a bit more today and keep his HR controlled, his vent may be weanable in the future but ongoing  fevers are an issue.   35 minutes critical care time.   Loralie Champagne 09/14/2015 8:17 AM

## 2015-09-14 NOTE — Progress Notes (Signed)
ANTIBIOTIC CONSULT NOTE - INITIAL  Pharmacy Consult for Vancocin and Tressie Ellis Indication: rule out sepsis  No Known Allergies  Patient Measurements: Height: 5\' 9"  (175.3 cm) Weight: 174 lb 13.2 oz (79.3 kg) IBW/kg (Calculated) : 70.7  Vital Signs: Temp: 103.1 F (39.5 C) (01/17 0000) Temp Source: Axillary (01/17 0000) BP: 82/54 mmHg (01/16 2311) Pulse Rate: 79 (01/16 2311) Intake/Output from previous day: 01/16 0701 - 01/17 0700 In: 3410.5 [I.V.:1575.5; NG/GT:1835] Out: 2950 [Urine:2950] Intake/Output from this shift: Total I/O In: 1454.5 [I.V.:879.5; NG/GT:575] Out: 1375 [Urine:1375]  Labs:  Recent Labs  09/11/15 0550  09/12/15 0421  09/12/15 2237 09/13/15 0059 09/13/15 0855 09/13/15 1721  WBC 14.6*  --  14.1*  --   --  14.1*  --   --   HGB 8.4*  --  8.2*  --   --  8.3*  --   --   PLT 196  --  217  --   --  273  --   --   CREATININE  --   < >  --   < > 1.37*  --  1.20 1.27*  < > = values in this interval not displayed. Estimated Creatinine Clearance: 54.9 mL/min (by C-G formula based on Cr of 1.27).  Microbiology: Recent Results (from the past 720 hour(s))  Urine culture     Status: None   Collection Time: 09/03/2015  4:51 PM  Result Value Ref Range Status   Specimen Description URINE, CATHETERIZED  Final   Special Requests Normal  Final   Culture NO GROWTH 2 DAYS  Final   Report Status 09/06/2015 FINAL  Final  Culture, blood (routine x 2)     Status: None   Collection Time: 09/17/2015  6:55 PM  Result Value Ref Range Status   Specimen Description BLOOD LEFT HAND  Final   Special Requests BOTTLES DRAWN AEROBIC ONLY 5CC  Final   Culture NO GROWTH 5 DAYS  Final   Report Status 09/09/2015 FINAL  Final  Culture, blood (routine x 2)     Status: None   Collection Time: 09/10/2015  7:20 PM  Result Value Ref Range Status   Specimen Description BLOOD RIGHT HAND  Final   Special Requests IN PEDIATRIC BOTTLE 4CC  Final   Culture NO GROWTH 5 DAYS  Final   Report Status  09/09/2015 FINAL  Final  MRSA PCR Screening     Status: None   Collection Time: 09/15/2015 11:55 PM  Result Value Ref Range Status   MRSA by PCR NEGATIVE NEGATIVE Final    Comment:        The GeneXpert MRSA Assay (FDA approved for NASAL specimens only), is one component of a comprehensive MRSA colonization surveillance program. It is not intended to diagnose MRSA infection nor to guide or monitor treatment for MRSA infections.     Medical History: Past Medical History  Diagnosis Date  . Congestive cardiac failure ([place])   . Arthritis   . Hypertension   . Hyperlipidemia   . MI (myocardial infarction) ([place])     Medications:  Prescriptions prior to admission  Medication Sig Dispense Refill Last Dose  . aspirin EC 81 MG tablet Take 81 mg by mouth daily.    09/03/2015 at Unknown time  . atorvastatin (LIPITOR) 40 MG tablet Take 1 tablet (40 mg total) by mouth daily. 30 tablet 11 09/07/2015 at Unknown time  . lisinopril (PRINIVIL,ZESTRIL) 20 MG tablet Take 1 tablet (20 mg total) by mouth daily. 30 tablet  5 09/25/2015 at Unknown time  . metoprolol succinate (TOPROL-XL) 25 MG 24 hr tablet Take 25 mg by mouth daily.    09/03/2015 at betw 10 & 11  . naproxen sodium (ALEVE) 220 MG tablet Take 440-660 mg by mouth 2 (two) times daily as needed (pain).   unknown  . vitamin C (ASCORBIC ACID) 500 MG tablet Take 500 mg by mouth daily.   couple weeks ago  . HYDROcodone-homatropine (HYCODAN) 5-1.5 MG/5ML syrup Take 5 mLs by mouth every 6 (six) hours as needed for cough. (Patient not taking: Reported on 09/27/2015) 240 mL 0 Not Taking at Unknown time   Scheduled:  . albuterol  2.5 mg Nebulization Q12H  . antiseptic oral rinse  7 mL Mouth Rinse 10 times per day  . aspirin  81 mg Oral Daily  . atorvastatin  80 mg Oral q1800  . chlorhexidine gluconate  15 mL Mouth Rinse BID  . clopidogrel  75 mg Oral Daily  . digoxin  0.125 mg Per Tube Daily  . free water  300 mL Per Tube Q6H  . furosemide  80 mg  Intravenous BID  . hydrocortisone sod succinate (SOLU-CORTEF) inj  50 mg Intravenous Q6H  . insulin aspart  2-6 Units Subcutaneous 6 times per day  . insulin glargine  10 Units Subcutaneous Q24H  . metoCLOPramide (REGLAN) injection  5 mg Intravenous 4 times per day  . pantoprazole sodium  40 mg Per Tube Daily  . polyethylene glycol  17 g Per Tube Daily  . senna-docusate  1 tablet Per Tube QHS  . sodium chloride  10-40 mL Intracatheter Q12H  . sodium chloride  3 mL Intravenous Q12H   Infusions:  . sodium chloride Stopped (09/13/15 2000)  . amiodarone 30 mg/hr (09/13/15 2055)  . dexmedetomidine 0.4 mcg/kg/hr (09/13/15 2203)  . feeding supplement (VITAL AF 1.2 CAL) 1,000 mL (09/13/15 2000)  . fentaNYL infusion INTRAVENOUS 350 mcg/hr (09/14/15 0033)    Assessment: 70yo male admitted 1/7 s/p cardiac arrest, was on IV ABX for presumed PNA though stopped after 2d of tx, no change on CXR 1/16 though now concern for sepsis 2/2 PNA, to resume IV ABX.  Goal of Therapy:  Vancomycin trough level 15-20 mcg/ml  Plan:  Will begin vancomycin 750mg  IV Q12H and Fortaz 2g IV Q8H and monitor CBC, Cx, levels prn.  Wynona Neat, PharmD, BCPS  09/14/2015,1:04 AM

## 2015-09-14 NOTE — Progress Notes (Signed)
[facility] ADULT ICU REPLACEMENT PROTOCOL FOR AM LAB REPLACEMENT ONLY  The patient does apply for the [facility] Adult ICU Electrolyte Replacment Protocol based on the criteria listed below:   1. Is GFR >/= 40 ml/min? Yes.    Patient's GFR today is 52 2. Is urine output >/= 0.5 ml/kg/hr for the last 6 hours? Yes.   Patient's UOP is 1.26 ml/kg/hr 3. Is BUN < 60 mg/dL? Yes.    Patient's BUN today is 43 4. Abnormal electrolyte  K 3.3 5. Ordered repletion with: per protocol 6. If a panic level lab has been reported, has the CCM MD in charge been notified? Yes.  .   Physician:  Mike Craze 09/14/2015 5:51 AM

## 2015-09-14 NOTE — Progress Notes (Signed)
[place] Progress Note Patient Name: [name] [facility] DOB: 05-19-1946 MRN: [id number]   Date of Service  09/14/2015  HPI/Events of Note  fevr 75  eICU Interventions  BC sent recent No UA?/ Send UA, likley pNA source, asp recent on vent Add vanc, ceftaz, sputum     Intervention Category Major Interventions: Infection - evaluation and management  FEINSTEIN,DANIEL J. 09/14/2015, 12:42 AM

## 2015-09-14 NOTE — Progress Notes (Signed)
Nutrition Follow-up  INTERVENTION:   Increase Vital AF 1.2 to 70 ml/hr via OG tube Provides: 2016 kcal (98% of needs), 126 grams protein, and 1362 ml H2O.  NUTRITION DIAGNOSIS:   Inadequate oral intake related to inability to eat as evidenced by NPO status. Ongoing.   GOAL:   Patient will meet greater than or equal to 90% of their needs Met.   MONITOR:   Labs, Vent status, TF tolerance, I & O's  ASSESSMENT:   Pt with hx of severe heart disease. Admitted after arrest while shovelling snow. SCD/VF arrest/ CPR/cath/ [place].   Patient is currently intubated on ventilator support MV: 11 L/min Temp (24hrs), Avg:101.5 F (38.6 C), Min:98.5 F (36.9 C), Max:103.1 F (39.5 C)  OG tube with Vital AF 1.2 @ 55 Free water: 300 ml every 6 hours = 1200 ml Medications reviewed and include: reglan, miralax, KCl, senokot-s Labs reviewed: sodium elevated (151), potassium low (3.3), Phos and Magnesium are WNL CBG's: 163-179 Fevers Off propofol.   Wife to meet with palliative care today at noon.   Diet Order:  Diet NPO time specified  Skin:  Reviewed, no issues  Last BM:  1/17  Height:   Ht Readings from Last 1 Encounters:  09/10/2015 _0  (1.753 m)   Weight:   Wt Readings from Last 1 Encounters:  09/14/15 173 lb 4.5 oz (78.6 kg)   Ideal Body Weight:  72.7 kg  BMI:  Body mass index is 25.58 kg/(m^2).  Estimated Nutritional Needs:   Kcal:  2059  Protein:  100-120 grams  Fluid:  > 2 L/day  EDUCATION NEEDS:   No education needs identified at this time  [place], [place], [place] Pager [phone] After Hours Pager

## 2015-09-14 NOTE — Progress Notes (Signed)
Pt placed back on full vent support due to increased WOB 

## 2015-09-14 NOTE — Consult Note (Signed)
Consultation Note Date: 09/14/2015   Patient Name: Patrick Sexton [facility]  DOB: [birth year]  MRN: [id number]  Age / Sex: 70 y.o., male  PCP: Carmon Ginsberg, PA Referring Physician: Rigoberto Noel, MD  Reason for Consultation: Establishing goals of care, psych support    Clinical Assessment/Narrative:  70 y.o. male with history of CAD s/p CABG x 4 2008, PAF, HTN, Ischemic cardiomyopathy Echo 03/2015 EF 45-50%, and dyslipidemia followed by Dr Jerrye Beavers who presented to ER on 09/15/2015 after resuscitation from Cardiac arrest. He was shoveling snow and developed SOB then collapsed when EMS was called. CPR x 10-15 minutes with Vfib, 2 rounds epi and deibrillated to Afib. Cooling started on arrival to ER.   Went for emergent cath 09/06/2015 with severe native and bypass graft disease with no clear culprit for acute infarct. LIMA to LAD is patent and exhibits reduced flow. All other vessels totally occlude or high-grade stenosis (>95%). Vfib in cath lab x 2 with successful defib.   Intubated/Sedated 09/03/2015.Required levophed and vasopressin for pressor support and IV amio/heparin. Remains intubated with poor weaning trials.  Rewarming initiated 09/06/15.  follow simple commands, but overall poor prognosis.  Family faced with advanced directive decisions.  Wife struggling with treatment options, worries that she is not "giving him every chance".  Her brother in law is pushing her to consider transfer to another facility, namely [facility].  She is requesting a some form of collaboration between physicians  here and physicians at [facility]  I have taken every opportunity to build trust and  help her understand that her husband is getting excellent care here, concepts of mortality and limitations of modern medicine.   The difference between a aggressive medical intervention path  and a palliative comfort care path for this patient at this time was had.  Values and  goals of care important to patient and family were attempted to be elicited.  She verbalizes that her husband would never want to live dependent or in a SNF  70 year old male who had a cardiac arrest shoveling snow, cath with severe non-operable CAD. There is nothing that can be done from a cardiac standpoint at this point  Ideally would like to get him to dry weight then extubate with no intention to reintubate.   Natural trajectory and expectations at EOL were discussed.  Questions and concerns addressed.  Hard Choices booklet left for review. Family encouraged to call with questions or concerns.  PMT will continue to support holistically.   Primary Decision Maker: wife    - at this time continue current medical treatmetn plan, wife is aware that decisions regarding Trach and Peg are at hand.  She is also aware that both are not recommended   Code Status/Advance Care Planning: DNR    Code Status Orders        Start     Ordered   09/08/15 1325  Do not attempt resuscitation (DNR)   Continuous    Question Answer Comment  In the event of cardiac or respiratory ARREST Do not call a "code blue"   In the event of cardiac or respiratory ARREST Do not perform Intubation, CPR, defibrillation or ACLS   In the event of cardiac or respiratory ARREST Use medication by any route, position, wound care, and other measures to relive pain and suffering. May use oxygen, suction and manual treatment of airway obstruction as needed for comfort.      09/08/15 1324    Code Status History  Date Active Date Inactive Code Status Order ID Comments User Context   09/18/2015  6:11 PM 09/08/2015  1:24 PM Full Code [id number]  Elsie Stain, MD Inpatient   09/14/2015  5:05 PM 09/25/2015  6:11 PM Full Code [id number]  Grace Bushy Minor, NP Inpatient   09/14/2015  4:58 PM 09/03/2015  5:05 PM Full Code [id number]  Grace Bushy Minor, NP Inpatient        Palliative Prophylaxis:   Aspiration, Bowel Regimen, Delirium  Protocol, Frequent Pain Assessment, Oral Care and Turn Reposition   Psycho-social/Spiritual:  Support System: [place] Desire for further Chaplaincy support:yes Additional Recommendations: Education on Hospice and Grief/Bereavement Support  Prognosis: Unable to determine  Discharge Planning: Pending   Chief Complaint/ Primary Diagnoses: Present on Admission:  . VF i shocked twice in field . HLD (hyperlipidemia) . Essential hypertension . PAF- NSR at LOV- now in AF with RVR post arrest . Cardiac arrest while shoveling snow . LBBB (left bundle branch block)  I have reviewed the medical record, interviewed the patient and family, and examined the patient. The following aspects are pertinent.  Past Medical History  Diagnosis Date  . Congestive cardiac failure ([place])   . Arthritis   . Hypertension   . Hyperlipidemia   . MI (myocardial infarction) [facility])    Social History   Social History  . Marital Status: Married    Spouse Name: N/A  . Number of Children: N/A  . Years of Education: N/A   Social History Main Topics  . Smoking status: Former Smoker    Types: Pipe    Start date: 08/28/1985  . Smokeless tobacco: None  . Alcohol Use: None  . Drug Use: None  . Sexual Activity: Not Asked   Other Topics Concern  . None   Social History Narrative   History reviewed. No pertinent family history. Scheduled Meds: . albuterol  2.5 mg Nebulization Q12H  . antiseptic oral rinse  7 mL Mouth Rinse 10 times per day  . aspirin  81 mg Oral Daily  . atorvastatin  80 mg Oral q1800  . cefTAZidime (FORTAZ)  IV  2 g Intravenous 3 times per day  . chlorhexidine gluconate  15 mL Mouth Rinse BID  . clopidogrel  75 mg Oral Daily  . digoxin  0.125 mg Per Tube Daily  . free water  300 mL Per Tube Q6H  . furosemide  40 mg Intravenous Q8H  . hydrocortisone sod succinate (SOLU-CORTEF) inj  50 mg Intravenous Q6H  . insulin aspart  2-6 Units Subcutaneous 6 times per day  . insulin glargine  10  Units Subcutaneous Q24H  . metoCLOPramide (REGLAN) injection  5 mg Intravenous 4 times per day  . pantoprazole sodium  40 mg Per Tube Daily  . polyethylene glycol  17 g Per Tube Daily  . potassium chloride  40 mEq Per Tube Once  . potassium chloride  40 mEq Per Tube Q4H  . senna-docusate  1 tablet Per Tube QHS  . sodium chloride  10-40 mL Intracatheter Q12H  . sodium chloride  3 mL Intravenous Q12H  . vancomycin  750 mg Intravenous Once  . vancomycin  750 mg Intravenous Q12H   Continuous Infusions: . sodium chloride Stopped (09/13/15 2000)  . amiodarone 30 mg/hr (09/14/15 0644)  . dexmedetomidine 0.4 mcg/kg/hr (09/14/15 1047)  . feeding supplement (VITAL AF 1.2 CAL) 1,000 mL (09/13/15 2000)  . fentaNYL infusion INTRAVENOUS 350 mcg/hr (09/14/15 0803)   PRN Meds:.sodium chloride, acetaminophen, fentaNYL, midazolam,  ondansetron (ZOFRAN) IV, pneumococcal 23 valent vaccine, sodium chloride, sodium chloride Medications Prior to Admission:  Prior to Admission medications   Medication Sig Start Date End Date Taking? Authorizing Provider  aspirin EC 81 MG tablet Take 81 mg by mouth daily.  04/15/15 04/14/16 Yes Historical Provider, MD  atorvastatin (LIPITOR) 40 MG tablet Take 1 tablet (40 mg total) by mouth daily. 03/10/15  Yes Carmon Ginsberg, PA  lisinopril (PRINIVIL,ZESTRIL) 20 MG tablet Take 1 tablet (20 mg total) by mouth daily. 03/10/15  Yes Carmon Ginsberg, PA  metoprolol succinate (TOPROL-XL) 25 MG 24 hr tablet Take 25 mg by mouth daily.  04/15/15 04/14/16 Yes Historical Provider, MD  naproxen sodium (ALEVE) 220 MG tablet Take 440-660 mg by mouth 2 (two) times daily as needed (pain).   Yes Historical Provider, MD  vitamin C (ASCORBIC ACID) 500 MG tablet Take 500 mg by mouth daily.   Yes Historical Provider, MD  HYDROcodone-homatropine (HYCODAN) 5-1.5 MG/5ML syrup Take 5 mLs by mouth every 6 (six) hours as needed for cough. Patient not taking: Reported on 08/30/2015 05/26/15   Carmon Ginsberg, PA    No Known Allergies  Review of Systems  Unable to perform ROS   Physical Exam  Constitutional: He is intubated.  Cardiovascular: Normal rate, regular rhythm and normal heart sounds.   Respiratory: He is intubated. He has decreased breath sounds in the right lower field and the left lower field.  Musculoskeletal:       Right shoulder: He exhibits decreased strength.  Neurological: He displays atrophy.  - follows simple commands  Skin: Skin is warm and dry.    Vital Signs: BP 119/73 mmHg  Pulse 70  Temp(Src) 98.5 F (36.9 C) (Oral)  Resp 18  Ht 5\' 9"  (1.753 m)  Wt 78.6 kg (173 lb 4.5 oz)  BMI 25.58 kg/m2  SpO2 99%  SpO2: SpO2: 99 % O2 Device:SpO2: 99 % O2 Flow Rate: .   IO: Intake/output summary:  Intake/Output Summary (Last 24 hours) at 09/14/15 1057 Last data filed at 09/14/15 1043  Gross per 24 hour  Intake 3723.06 ml  Output   5375 ml  Net -1651.94 ml    LBM: Last BM Date: 09/14/15 Baseline Weight: Weight: 79.379 kg (175 lb) Most recent weight: Weight: 78.6 kg (173 lb 4.5 oz)      Palliative Assessment/Data:  Flowsheet Rows        Most Recent Value   Intake Tab    Referral Department  Cardiology   Unit at Time of Referral  ICU   Palliative Care Primary Diagnosis  Cardiac   Date Notified  09/13/15   Palliative Care Type  New Palliative care   Reason for referral  Clarify Goals of Care   Date of Admission  09/26/2015   # of days IP prior to Palliative referral  9   Clinical Assessment    Psychosocial & Spiritual Assessment    Palliative Care Outcomes       Additional Data Reviewed:  CBC:    Component Value Date/Time   WBC 15.6* 09/14/2015 0511   HGB 8.0* 09/14/2015 0511   HCT 27.1* 09/14/2015 0511   PLT 252 09/14/2015 0511   MCV 85.0 09/14/2015 0511   NEUTROABS 11.8* 09/09/2015 0230   LYMPHSABS 0.6* 09/09/2015 0230   MONOABS 0.7 09/09/2015 0230   EOSABS 0.0 09/09/2015 0230   BASOSABS 0.0 09/09/2015 0230   Comprehensive Metabolic Panel:     Component Value Date/Time   NA 151* 09/14/2015 0511  K 3.3* 09/14/2015 0511   CL 109 09/14/2015 0511   CO2 33* 09/14/2015 0511   BUN 43* 09/14/2015 0511   CREATININE 1.36* 09/14/2015 0511   GLUCOSE 189* 09/14/2015 0511   CALCIUM 7.3* 09/14/2015 0511   AST 61* 09/09/2015 0230   ALT 83* 09/09/2015 0230   ALKPHOS 137* 09/09/2015 0230   BILITOT 1.4* 09/09/2015 0230   PROT 5.0* 09/09/2015 0230   ALBUMIN 2.1* 09/09/2015 0230   Discussed with Dr Lindwood Qua PA-C   Time In: 1200 Time Out: 1330 Time Total: 90 min Greater than 50%  of this time was spent counseling and coordinating care related to the above assessment and plan.  Signed by: Wadie Lessen, NP  Knox Royalty, NP  09/14/2015, 10:57 AM  Please contact Palliative Medicine Team phone at [phone] for questions and concerns.

## 2015-09-15 ENCOUNTER — Inpatient Hospital Stay (HOSPITAL_COMMUNITY): Payer: Medicare Other

## 2015-09-15 DIAGNOSIS — Z515 Encounter for palliative care: Secondary | ICD-10-CM | POA: Insufficient documentation

## 2015-09-15 LAB — BLOOD GAS, ARTERIAL
Acid-Base Excess: 11.1 mmol/L — ABNORMAL HIGH (ref 0.0–2.0)
BICARBONATE: 34.2 meq/L — AB (ref 20.0–24.0)
Drawn by: 345601
FIO2: 0.4
LHR: 18 {breaths}/min
O2 SAT: 97 %
PATIENT TEMPERATURE: 98.6
PCO2 ART: 36.3 mmHg (ref 35.0–45.0)
PEEP/CPAP: 5 cmH2O
PH ART: 7.581 — AB (ref 7.350–7.450)
TCO2: 35.3 mmol/L (ref 0–100)
VT: 0.55 mL
pO2, Arterial: 85.5 mmHg (ref 80.0–100.0)

## 2015-09-15 LAB — PROTIME-INR
INR: 1.43 (ref 0.00–1.49)
PROTHROMBIN TIME: 17.5 s — AB (ref 11.6–15.2)

## 2015-09-15 LAB — BASIC METABOLIC PANEL
ANION GAP: 12 (ref 5–15)
ANION GAP: 15 (ref 5–15)
BUN: 46 mg/dL — ABNORMAL HIGH (ref 6–20)
BUN: 44 mg/dL — ABNORMAL HIGH (ref 6–20)
CALCIUM: 7.3 mg/dL — AB (ref 8.9–10.3)
CHLORIDE: 98 mmol/L — AB (ref 101–111)
CO2: 35 mmol/L — ABNORMAL HIGH (ref 22–32)
CO2: 32 mmol/L (ref 22–32)
CREATININE: 1.4 mg/dL — AB (ref 0.61–1.24)
Calcium: 6.5 mg/dL — ABNORMAL LOW (ref 8.9–10.3)
Chloride: 103 mmol/L (ref 101–111)
Creatinine, Ser: 1.44 mg/dL — ABNORMAL HIGH (ref 0.61–1.24)
GFR calc non Af Amer: 50 mL/min — ABNORMAL LOW (ref >60)
GFR, EST AFRICAN AMERICAN: 56 mL/min — AB (ref >60)
GFR, EST AFRICAN AMERICAN: 58 mL/min — AB (ref >60)
GFR, EST NON AFRICAN AMERICAN: 48 mL/min — AB (ref >60)
GLUCOSE: 173 mg/dL — AB (ref 65–99)
Glucose, Bld: 376 mg/dL — ABNORMAL HIGH (ref 65–99)
POTASSIUM: 2.8 mmol/L — AB (ref 3.5–5.1)
POTASSIUM: 2.6 mmol/L — AB (ref 3.5–5.1)
SODIUM: 150 mmol/L — AB (ref 135–145)
SODIUM: 145 mmol/L (ref 135–145)

## 2015-09-15 LAB — CBC
HEMATOCRIT: 28.1 % — AB (ref 39.0–52.0)
HEMOGLOBIN: 8.3 g/dL — AB (ref 13.0–17.0)
MCH: 25.1 pg — ABNORMAL LOW (ref 26.0–34.0)
MCHC: 29.5 g/dL — AB (ref 30.0–36.0)
MCV: 84.9 fL (ref 78.0–100.0)
Platelets: 274 10*3/uL (ref 150–400)
RBC: 3.31 MIL/uL — ABNORMAL LOW (ref 4.22–5.81)
RDW: 17.1 % — ABNORMAL HIGH (ref 11.5–15.5)
WBC: 18.3 10*3/uL — AB (ref 4.0–10.5)

## 2015-09-15 LAB — GLUCOSE, CAPILLARY
GLUCOSE-CAPILLARY: 136 mg/dL — AB (ref 65–99)
GLUCOSE-CAPILLARY: 138 mg/dL — AB (ref 65–99)
GLUCOSE-CAPILLARY: 138 mg/dL — AB (ref 65–99)
Glucose-Capillary: 152 mg/dL — ABNORMAL HIGH (ref 65–99)
Glucose-Capillary: 153 mg/dL — ABNORMAL HIGH (ref 65–99)
Glucose-Capillary: 197 mg/dL — ABNORMAL HIGH (ref 65–99)

## 2015-09-15 LAB — CARBOXYHEMOGLOBIN
CARBOXYHEMOGLOBIN: 1.7 % — AB (ref 0.5–1.5)
METHEMOGLOBIN: 0.6 % (ref 0.0–1.5)
O2 Saturation: 57.3 %
Total hemoglobin: 9 g/dL — ABNORMAL LOW (ref 13.5–18.0)

## 2015-09-15 LAB — MAGNESIUM: MAGNESIUM: 2.2 mg/dL (ref 1.7–2.4)

## 2015-09-15 LAB — POCT I-STAT 3, ART BLOOD GAS (G3+)
ACID-BASE EXCESS: 14 mmol/L — AB (ref 0.0–2.0)
Bicarbonate: 37.9 mEq/L — ABNORMAL HIGH (ref 20.0–24.0)
O2 Saturation: 99 %
PH ART: 7.544 — AB (ref 7.350–7.450)
TCO2: 39 mmol/L (ref 0–100)
pCO2 arterial: 44 mmHg (ref 35.0–45.0)
pO2, Arterial: 110 mmHg — ABNORMAL HIGH (ref 80.0–100.0)

## 2015-09-15 LAB — TRIGLYCERIDES: Triglycerides: 70 mg/dL (ref <150)

## 2015-09-15 LAB — PHOSPHORUS: Phosphorus: 3.7 mg/dL (ref 2.5–4.6)

## 2015-09-15 MED ORDER — POTASSIUM CHLORIDE 20 MEQ/15ML (10%) PO SOLN
40.0000 meq | ORAL | Status: AC
  Administered 2015-09-15 (×2): 40 meq
  Filled 2015-09-15 (×2): qty 30

## 2015-09-15 MED ORDER — FUROSEMIDE 10 MG/ML IJ SOLN
80.0000 mg | Freq: Two times a day (BID) | INTRAMUSCULAR | Status: DC
  Administered 2015-09-15: 80 mg via INTRAVENOUS
  Filled 2015-09-15: qty 8

## 2015-09-15 MED ORDER — POTASSIUM CHLORIDE 20 MEQ/15ML (10%) PO SOLN
40.0000 meq | Freq: Once | ORAL | Status: AC
  Administered 2015-09-15: 40 meq
  Filled 2015-09-15: qty 30

## 2015-09-15 MED ORDER — ACETAZOLAMIDE SODIUM 500 MG IJ SOLR
250.0000 mg | Freq: Three times a day (TID) | INTRAMUSCULAR | Status: AC
  Administered 2015-09-15 (×2): 250 mg via INTRAVENOUS
  Filled 2015-09-15 (×2): qty 500

## 2015-09-15 MED ORDER — POTASSIUM CHLORIDE 10 MEQ/50ML IV SOLN
10.0000 meq | INTRAVENOUS | Status: AC
  Administered 2015-09-15 – 2015-09-16 (×4): 10 meq via INTRAVENOUS
  Filled 2015-09-15 (×4): qty 50

## 2015-09-15 MED ORDER — POTASSIUM CHLORIDE 20 MEQ/15ML (10%) PO SOLN
40.0000 meq | Freq: Two times a day (BID) | ORAL | Status: DC
  Administered 2015-09-15: 40 meq via ORAL
  Filled 2015-09-15: qty 30

## 2015-09-15 MED ORDER — DEXTROSE 5 % IV SOLN
2.0000 g | Freq: Two times a day (BID) | INTRAVENOUS | Status: DC
  Administered 2015-09-15: 2 g via INTRAVENOUS
  Filled 2015-09-15 (×3): qty 2

## 2015-09-15 MED ORDER — FUROSEMIDE 10 MG/ML IJ SOLN
40.0000 mg | Freq: Two times a day (BID) | INTRAMUSCULAR | Status: DC
  Administered 2015-09-15: 40 mg via INTRAVENOUS
  Filled 2015-09-15: qty 4

## 2015-09-15 NOTE — Progress Notes (Signed)
[name] [facility] DOB: 08-Aug-1946 MRN: [id number]   Date of Service  09/15/2015  HPI/Events of Note    eICU Interventions  Hypokalemia -repleted      Intervention Category Intermediate Interventions: Electrolyte abnormality - evaluation and management  ALVA,RAKESH V. 09/15/2015, 10:24 PM

## 2015-09-15 NOTE — Progress Notes (Signed)
Daily Progress Note   Patient Name: Patrick Sexton [facility]       Date: 09/15/2015 DOB: September 28, 1945  Age: 70 y.o. MRN#: [id number] Attending Physician: Rigoberto Noel, MD Primary Care Physician: Carmon Ginsberg, PA Admit Date: 09/26/2015  Reason for Consultation/Follow-up: Establishing goals of care and Psychosocial/spiritual support  Subjective:  Continued conversation at the patient's bedside along with wife  to discuss diagnosis, prognosis, GOC, treatment plan  and options.  Created space and opportunity for [name] to share her thoughts and feelings and fears regarding current situation.  Today she verbalizes her understanding that patient will continue to wean from vent, cardoversion tomorrow with a possible wean from the ventilator.  She verbalizes an understanding that her husband would not be re-intubated if he begins to decompensate.   However she "feels very hopeful today:" that he is "going to turn around".    I expressed my worry that his prognosis remains poor and he may decompensate over hours to days after extubation  Values and goals of care important to patient and family were attempted to be elicited.  Family encouraged to call with questions or concerns.  PMT will continue to support holistically.   Length of Stay: 11 days  Current Medications: Scheduled Meds:  . albuterol  2.5 mg Nebulization Q12H  . antiseptic oral rinse  7 mL Mouth Rinse 10 times per day  . aspirin  81 mg Oral Daily  . atorvastatin  80 mg Oral q1800  . cefTAZidime (FORTAZ)  IV  2 g Intravenous Q12H  . chlorhexidine gluconate  15 mL Mouth Rinse BID  . clopidogrel  75 mg Oral Daily  . digoxin  0.125 mg Per Tube Daily  . free water  300 mL Per Tube Q6H  . furosemide  80 mg Intravenous BID  . hydrocortisone  sod succinate (SOLU-CORTEF) inj  50 mg Intravenous Q6H  . insulin aspart  2-6 Units Subcutaneous 6 times per day  . insulin glargine  10 Units Subcutaneous Q24H  . metoCLOPramide (REGLAN) injection  5 mg Intravenous 4 times per day  . pantoprazole sodium  40 mg Per Tube Daily  . polyethylene glycol  17 g Per Tube Daily  . potassium chloride  40 mEq Per Tube Q4H  . potassium chloride  40 mEq Per Tube Once  .  potassium chloride  40 mEq Oral BID  . senna-docusate  1 tablet Per Tube QHS  . sodium chloride  10-40 mL Intracatheter Q12H  . sodium chloride  3 mL Intravenous Q12H  . vancomycin  750 mg Intravenous Q12H    Continuous Infusions: . sodium chloride Stopped (09/13/15 2000)  . amiodarone 30 mg/hr (09/15/15 0926)  . dexmedetomidine 0.8 mcg/kg/hr (09/15/15 0927)  . feeding supplement (VITAL AF 1.2 CAL) Stopped (09/15/15 0536)  . fentaNYL infusion INTRAVENOUS 350 mcg/hr (09/15/15 0836)    PRN Meds: sodium chloride, acetaminophen, fentaNYL, midazolam, ondansetron (ZOFRAN) IV, pneumococcal 23 valent vaccine, sodium chloride, sodium chloride  Physical Exam: Physical Exam  Constitutional: He appears cachectic. He appears ill. He is intubated.  Pulmonary/Chest: He is intubated.  Neurological: He is alert.  Follows simply commands                Vital Signs: BP 93/56 mmHg  Pulse 68  Temp(Src) 98.9 F (37.2 C) (Oral)  Resp 18  Ht 5\' 9"  (1.753 m)  Wt 76.6 kg (168 lb 14 oz)  BMI 24.93 kg/m2  SpO2 99% SpO2: SpO2: 99 % O2 Device: O2 Device: Ventilator O2 Flow Rate:    Intake/output summary:  Intake/Output Summary (Last 24 hours) at 09/15/15 1004 Last data filed at 09/15/15 0800  Gross per 24 hour  Intake 3369.5 ml  Output   5405 ml  Net -2035.5 ml   LBM: Last BM Date: 09/14/15 Baseline Weight: Weight: 79.379 kg (175 lb) Most recent weight: Weight: 76.6 kg (168 lb 14 oz)       Palliative Assessment/Data: Flowsheet Rows        Most Recent Value   Intake Tab     Referral Department  Cardiology   Unit at Time of Referral  ICU   Palliative Care Primary Diagnosis  Cardiac   Date Notified  09/13/15   Palliative Care Type  New Palliative care   Reason for referral  Clarify Goals of Care   Date of Admission  08/29/2015   # of days IP prior to Palliative referral  9   Clinical Assessment    Psychosocial & Spiritual Assessment    Palliative Care Outcomes       Additional Data Reviewed: CBC    Component Value Date/Time   WBC 18.3* 09/15/2015 0444   RBC 3.31* 09/15/2015 0444   HGB 8.3* 09/15/2015 0444   HCT 28.1* 09/15/2015 0444   PLT 274 09/15/2015 0444   MCV 84.9 09/15/2015 0444   MCH 25.1* 09/15/2015 0444   MCHC 29.5* 09/15/2015 0444   RDW 17.1* 09/15/2015 0444   LYMPHSABS 0.6* 09/09/2015 0230   MONOABS 0.7 09/09/2015 0230   EOSABS 0.0 09/09/2015 0230   BASOSABS 0.0 09/09/2015 0230    CMP     Component Value Date/Time   NA 150* 09/15/2015 0444   K 2.8* 09/15/2015 0444   CL 103 09/15/2015 0444   CO2 35* 09/15/2015 0444   GLUCOSE 173* 09/15/2015 0444   BUN 46* 09/15/2015 0444   CREATININE 1.44* 09/15/2015 0444   CALCIUM 7.3* 09/15/2015 0444   PROT 5.0* 09/09/2015 0230   ALBUMIN 2.1* 09/09/2015 0230   AST 61* 09/09/2015 0230   ALT 83* 09/09/2015 0230   ALKPHOS 137* 09/09/2015 0230   BILITOT 1.4* 09/09/2015 0230   GFRNONAA 48* 09/15/2015 0444   GFRAA 56* 09/15/2015 0444       Problem List:  Patient Active Problem List   Diagnosis Date Noted  . Palliative care  encounter   . DNR (do not resuscitate) 09/14/2015  . Encounter for orogastric tube placement   . Acute respiratory failure with hypoxemia ([place])   . Encounter for central line placement   . Acute respiratory failure ([place])   . Aspiration pneumonia ([place])   . VF i shocked twice in field 09/22/2015  . PAF- NSR at LOV- now in AF with RVR post arrest 09/17/2015  . Cardiac arrest while shoveling snow 09/26/2015  . LBBB (left bundle branch block) 09/08/2015  . Elevated  troponin   . Lactic acid acidosis   . CCF (congestive cardiac failure) ([place]) 02/10/2015  . Arthritis, degenerative 02/10/2015  . HLD (hyperlipidemia) 02/10/2015  . Essential hypertension 02/10/2015  . AF (paroxysmal atrial fibrillation) ([place]) 02/10/2015  . H/O CABG x 4 2008 02/10/2015     Palliative Care Assessment & Plan    Code Status:  DNR-previously documented     Code Status Orders        Start     Ordered   09/08/15 1325  Do not attempt resuscitation (DNR)   Continuous    Question Answer Comment  In the event of cardiac or respiratory ARREST Do not call a "code blue"   In the event of cardiac or respiratory ARREST Do not perform Intubation, CPR, defibrillation or ACLS   In the event of cardiac or respiratory ARREST Use medication by any route, position, wound care, and other measures to relive pain and suffering. May use oxygen, suction and manual treatment of airway obstruction as needed for comfort.      09/08/15 1324    Code Status History    Date Active Date Inactive Code Status Order ID Comments User Context   09/10/2015  6:11 PM 09/08/2015  1:24 PM Full Code [id number]  Elsie Stain, MD Inpatient   09/16/2015  5:05 PM 09/01/2015  6:11 PM Full Code [id number]  Grace Bushy Minor, NP Inpatient   09/10/2015  4:58 PM 09/18/2015  5:05 PM Full Code [id number]  Grace Bushy Minor, NP Inpatient        Goals of Care/Additional Recommendations:   Desire for further Chaplaincy support:yes  Psycho-social Needs: Grief/Bereavement Support     Palliative Prophylaxis:   Aspiration, Bowel Regimen, Delirium Protocol, Eye Care, Frequent Pain Assessment, Oral Care and Turn Reposition     Discharge Planning:  Pending outcomes   Care plan was discussed with Dr Nelda Marseille  Thank you for allowing the Palliative Medicine Team to assist in the care of this patient.   Time In: 0915 Time Out: 1000 Total Time 45 min Prolonged Time Billed  no         Knox Royalty, NP  09/15/2015,  10:04 AM  Please contact Palliative Medicine Team phone at [phone] for questions and concerns.

## 2015-09-15 NOTE — Progress Notes (Signed)
Advanced Heart Failure Consult Note  Referring MD: Dr Elsworth Soho PCP: Carmon Ginsberg, PA Primary Cardiologist: Dr Clayborn Bigness  Subjective:    SIGNIFICANT EVENTS: 01/07 cardiac arrest/intubated/sedated/emergent cath 01/08 shock 01/09 rewarm 01/10 follows commands 01/11 dnr established, [facility] signed off 01/16 fevers overnight, BCx sent. HF team asked to see. Fever spike 103, UA sent.  STUDIES:  CT head 1/7>> mild atrophy LHC 1/7>> no intervention but severe LV dysfunction and severe native and graft disease CXR 1/9>> EEG 1/9>>Abnormal with moderate generalized nonspecific continuous slowing of cerebral activity, no epileptic disorder Echo 09/13/15>> LVEF 30-35%, mildly reduce RV, mild MR, mod TR, PA peak pressure 60 mm Hg  Coox 57.3%.Out net ~ 3L x 24 hrs.  Down 5 lbs. Lasix decreased by CCM. Creatinine and BUN trending up. 1.2 => 1.27 => 1.36 => 1.44. Remains intubated with intermittent command following on fentanyl gtt (400 => 350).  No further bloody secretions (tube or foley). Attempted wean of vent reversed yesterday with increased WOB.    Objective:   Weight Range: 168 lb 14 oz (76.6 kg) Body mass index is 24.93 kg/(m^2).   Vital Signs:   Temp:  [98.5 F (36.9 C)-102.1 F (38.9 C)] 100.4 F (38 C) (01/18 0500) Pulse Rate:  [66-94] 66 (01/18 0600) Resp:  [15-25] 18 (01/18 0600) BP: (85-125)/(43-73) 85/43 mmHg (01/18 0600) SpO2:  [97 %-100 %] 98 % (01/18 0600) FiO2 (%):  [40 %] 40 % (01/18 0320) Weight:  [168 lb 14 oz (76.6 kg)] 168 lb 14 oz (76.6 kg) (01/18 0437) Last BM Date: 09/14/15  Weight change: Filed Weights   09/13/15 0320 09/14/15 0500 09/15/15 0437  Weight: 174 lb 13.2 oz (79.3 kg) 173 lb 4.5 oz (78.6 kg) 168 lb 14 oz (76.6 kg)    Intake/Output:   Intake/Output Summary (Last 24 hours) at 09/15/15 0711 Last data filed at 09/15/15 0600  Gross per 24 hour  Intake 3267.7 ml  Output   6525 ml  Net -3257.3 ml     Physical Exam: CVP: 9-10 General:  Chronically ill appearing, intubated and sedated. HEENT: Remains on vent.  Neck: supple. JVP elevated jaw. Carotids 2+ bilat; no bruits. No thyromegaly or lymphadenopathy noted. Cor: PMI nondisplaced. Irregularly irregular. Lungs: Scattered rhonchi , L worse than R. Abdomen: soft, ND, no HSM. No bruits or masses. +BS  Extremities: no cyanosis, clubbing, rash. Trace-1+ ankle edema. Neuro: Intubated, sedated on fentanyl currently. Intermittently follows simple commands.  Telemetry: Reviewed personally, Afib 70s with LBBB  Labs: CBC  Recent Labs  09/14/15 0511 09/15/15 0444  WBC 15.6* 18.3*  HGB 8.0* 8.3*  HCT 27.1* 28.1*  MCV 85.0 84.9  PLT 252 [id number]   Basic Metabolic Panel  Recent Labs  09/14/15 0511 09/15/15 0444  NA 151* 150*  K 3.3* 2.8*  CL 109 103  CO2 33* 35*  GLUCOSE 189* 173*  BUN 43* 46*  CREATININE 1.36* 1.44*  CALCIUM 7.3* 7.3*  MG 2.2 2.2  PHOS 3.8 3.7   Liver Function Tests No results for input(s): AST, ALT, ALKPHOS, BILITOT, PROT, ALBUMIN in the last 72 hours. No results for input(s): LIPASE, AMYLASE in the last 72 hours. Cardiac Enzymes No results for input(s): CKTOTAL, CKMB, CKMBINDEX, TROPONINI in the last 72 hours.  BNP: BNP (last 3 results)  Recent Labs  09/17/2015 1520 09/07/15 1057  BNP 235.5* 670.0*    ProBNP (last 3 results) No results for input(s): PROBNP in the last 8760 hours.   D-Dimer No results for input(s): DDIMER in  the last 72 hours. Hemoglobin A1C No results for input(s): HGBA1C in the last 72 hours. Fasting Lipid Panel  Recent Labs  09/12/15 1049  TRIG 172*   Thyroid Function Tests No results for input(s): TSH, T4TOTAL, T3FREE, THYROIDAB in the last 72 hours.  Invalid input(s): FREET3  Other results:     Imaging/Studies:  Dg Chest Port 1 View  09/14/2015  CLINICAL DATA:  Shortness of breath. EXAM: PORTABLE CHEST 1 VIEW COMPARISON:  September 13, 2015. FINDINGS: Stable cardiomegaly. Status post coronary  artery bypass graft. Endotracheal and nasogastric tubes are unchanged and in grossly good position. Left internal jugular catheter line is unchanged with distal tip in expected position of SVC. No pneumothorax or pleural effusion is noted. Stable bilateral diffuse lung opacities are noted concerning for edema or inflammation. Bony thorax is unremarkable. IMPRESSION: Stable support apparatus. Stable bilateral lung opacities concerning for edema or pneumonia. Electronically Signed   By: Marijo Conception, M.D.   On: 09/14/2015 07:32    Latest Echo  Latest Cath   Medications:     Scheduled Medications: . albuterol  2.5 mg Nebulization Q12H  . antiseptic oral rinse  7 mL Mouth Rinse 10 times per day  . aspirin  81 mg Oral Daily  . atorvastatin  80 mg Oral q1800  . cefTAZidime (FORTAZ)  IV  2 g Intravenous 3 times per day  . chlorhexidine gluconate  15 mL Mouth Rinse BID  . clopidogrel  75 mg Oral Daily  . digoxin  0.125 mg Per Tube Daily  . free water  300 mL Per Tube Q6H  . hydrocortisone sod succinate (SOLU-CORTEF) inj  50 mg Intravenous Q6H  . insulin aspart  2-6 Units Subcutaneous 6 times per day  . insulin glargine  10 Units Subcutaneous Q24H  . metoCLOPramide (REGLAN) injection  5 mg Intravenous 4 times per day  . pantoprazole sodium  40 mg Per Tube Daily  . polyethylene glycol  17 g Per Tube Daily  . potassium chloride  40 mEq Per Tube Q4H  . senna-docusate  1 tablet Per Tube QHS  . sodium chloride  10-40 mL Intracatheter Q12H  . sodium chloride  3 mL Intravenous Q12H  . vancomycin  750 mg Intravenous Q12H    Infusions: . sodium chloride Stopped (09/13/15 2000)  . amiodarone 30 mg/hr (09/14/15 2227)  . dexmedetomidine 0.8 mcg/kg/hr (09/15/15 0536)  . feeding supplement (VITAL AF 1.2 CAL) Stopped (09/15/15 0536)  . fentaNYL infusion INTRAVENOUS 350 mcg/hr (09/14/15 1448)    PRN Medications: sodium chloride, acetaminophen, fentaNYL, midazolam, ondansetron (ZOFRAN) IV,  pneumococcal 23 valent vaccine, sodium chloride, sodium chloride   Assessment/Plan   NAYEL WARNCKE is a 70 y.o. male with history of CAD s/p CABG x 4 2008, PAF, HTN, Ischemic cardiomyopathy Echo 03/2015 EF 45-50%, and dyslipidemia who presented to [facility] 09/08/2015 after cardiac arrest. Emergent cath with severe CAD, EF 25%, and no culprit. Medical management. Remains on vent. HF team asked to see for second opinion regarding treatment of CAD/CHF (i.e. Advanced therapies) and recurrent SVT with attempts to wean vent.  1. Acute respiratory failure, VDRF - Per CCM. They suspect aspiration with patchy infiltrates. - ABX per primary. Continues to have fever TMax 102.1 - Concerned that he will need tracheostomy.  - Pan cultures 09/16/2015 negative, re-drawn 09/13/15 with bloody secretions and fever. - Now on Vanc/Ceftaz. 2. Severe CAD s/p cardiac arrest. CPR x 10 minutes with epi x 1. - Emergent cath 09/18/2015 with severe native  and bypass disease with no clear culprit. - Not candidate for PCI or CABG redo.  - EEG 09/06/15 Abnormal with moderate generalized nonspecific continuous slowing of cerebral activity 3. Acute Systolic HF, EF [id number] by Echo, Ischemic CM - in setting of cardiac arrest - Coox 57.3% this am. I/O negative yesterday but aggressive diurese curtailed by CCM.  - With Creatinine and BUN trending up, need to be careful with diuresis. CVP 9-10 this am. Will discuss optimal dosing with Dr. Aundra Dubin. - Echo 1/16 LVEF 30-35%, mildly reduce RV, mild MR, mod TR, PA peak pressure 60 mm Hg 4. PAF - Rates continue to improve on amio - Remains off Off heparin gtt with urethral bleeding into foley - With no further bleeding could consider starting back heparin and planning DCCV once more stable, later this week. But with repeated fails attempt to wean now, question efffectiveness. Hgb 8.3. - CHA2DS2-VASc = 5 - Supp K for goal > 4.0.  6. Endocrine - Per CCM 7.AKI on CKD stage II - BUN and Cr trending up. -  Continue to follow closely. 8. Hypernatremia - Continues to trend down slowl with free water boluses. - Per CCM 9. ID - UA negative, few bacteria, + protein - Remains febrile. Tmax 102.1. WBC 14.1 => 15.6 => 18.3 - Completed Unasyn. Added back on Ceftaz/vanc 09/13/15 with fevers into 103.  Length of Stay: [address]  Shirley Friar PA-C 09/15/2015, 7:11 AM  Advanced Heart Failure Team Pager [phone] (M-F; 7a - 4p)  Please contact [facility] for night-coverage after hours (4p -7a ) and weekends on [email or web address]  1. Acute hypoxemic respiratory failure: Bilateral airspace disease, suspect aspiration post-arrest. Also suspect pulmonary edema, CVP 12 today on my measurement. Good diuresis yesterday, down 5 lbs.  Spiked fever to 102 again last night.  - Needs more diuresis => would give Lasix 80 mg IV bid, hold off on metolazone today with mild rise in creatinine.   - Antibiotic coverage with vanco/ceftazidime.  - At this point, think infectious/PNA issues are more limiting in terms of extubation, pulmonary edema improving.   2. CAD: Severe native vessel disease with LIMA-LAD as the only functional graft. Based on the appearance of the cath, his arrest may have been scar-mediated VF rather than primary plaque rupture/ACS. No interventional or redo CABG option.  - Continue ASA 81, Plavix, atorvastatin.  3. Acute on chronic systolic CHF: LV-gram with EF 25%, echo 09/13/15 with EF 30-35%. Co-ox 57% (adequate). Started digoxin. CVP 12, improved from yesterday with good diuresis. - Continue digoxin - Lasix 80 mg IV bid today.   4. Atrial fibrillation: Patient is currently in atrial fibrillation with rate improved in the 70s on amiodarone gtt. When vent weaning was attempted 1/16, HR increased to 170s with afib/RVR but now on amiodarone.   - Continue amiodarone and digoxin for rate control. - No further bloody secretions from ET tube per nursing, hemoglobin higher.  TEE-guided DCCV  to help with cardiac function could be considered, however would hold off on this until fevers are controlled to improve chances of holding normal rhythm.  Will continue to follow hemoglobin and look for further bleeding for another day prior to starting a heparin gtt in anticipation of DCCV.  5. Hypernatremia: Free water boluses continue, sodium with slow downward trend but still high at 150.  6. Renal: Mild rise in creatinine with diuresis.   7. ID: Recurrent fever last night to 102. Suspect from lung source with probable aspiration PNA. He  was recultured and started on ceftazidime/vancomycin.  8. Would be reasonable to involve palliative care. He is critically ill. Aspiration PNA + volume overload/pulmonary edema are limiting his vent weaning. If we can diurese him a bit more today and keep his HR controlled, his vent may be weanable in the future but ongoing fevers are an issue. I think that his cardiac function is not the main limiting factor at this point (think PNA playing a larger role).   Loralie Champagne 09/15/2015 8:41 AM

## 2015-09-15 NOTE — Progress Notes (Signed)
MD placed pt on wean. Patient passed

## 2015-09-15 NOTE — Progress Notes (Signed)
CRITICAL VALUE ALERT  Critical value received: Potassium 2.6  Date of notification:  09/15/15  Time of notification:  9:20  Critical value read back:Yes.    Nurse who received alert:  Cleotis Nipper, RN  MD notified (1st page): Dr. Elsworth Soho  Time of first page:  9:40  Time MD responded: 9:40

## 2015-09-15 NOTE — Progress Notes (Signed)
PULMONARY / CRITICAL CARE MEDICINE   Name: Patrick Sexton [facility] MRN: [id number] DOB: [birth year]    ADMISSION DATE:  09/07/2015  REFERRING MD:  EDP  CHIEF COMPLAINT:  Cardiac arrest    SUBJECTIVE:  Continued fever overnight as high as 102 F. Hypotensive this AM.    VITAL SIGNS: BP 93/56 mmHg  Pulse 68  Temp(Src) 98.9 F (37.2 C) (Oral)  Resp 18  Ht 5\' 9"  (1.753 m)  Wt 168 lb 14 oz (76.6 kg)  BMI 24.93 kg/m2  SpO2 99%  HEMODYNAMICS: CVP:  [5 mmHg-13 mmHg] 7 mmHg  VENTILATOR SETTINGS: Vent Mode:  [-] PRVC FiO2 (%):  [40 %] 40 % Set Rate:  [18 bmp] 18 bmp Vt Set:  [550 mL] 550 mL PEEP:  [5 cmH20] 5 cmH20 Pressure Support:  [8 cmH20] 8 cmH20 Plateau Pressure:  [18 cmH20-23 cmH20] 23 cmH20  INTAKE / OUTPUT: I/O last 3 completed shifts: In: 5656.5 [I.V.:2614.5; NG/GT:2342; IV [id number] Out: [id number] [Urine:8450]  PHYSICAL EXAMINATION: General: Intubated, sedated.  Neuro: Sedated, follows commands. Moves all extremities spontaneously.  HEENT: PERRL, ETT/OGT in place. Moist mucus membranes.  Cardiovascular: Regular rate, irregular rhythm. No murmurs.  Lungs: Air entry equal, coarse breath sounds throughout. No wheezes.  Abdomen:  Soft, non-tender, non-distended. BS + Musculoskeletal: Intact.  Skin: Cool, no rashes.   LABS:  BMET  Recent Labs Lab 09/13/15 1721 09/14/15 0511 09/15/15 0444  NA 152* 151* 150*  K 3.9 3.3* 2.8*  CL 107 109 103  CO2 33* 33* 35*  BUN 41* 43* 46*  CREATININE 1.27* 1.36* 1.44*  GLUCOSE 162* 189* 173*    Electrolytes  Recent Labs Lab 09/13/15 0855 09/13/15 1340 09/13/15 1721 09/14/15 0511 09/15/15 0444  CALCIUM 7.7*  --  7.5* 7.3* 7.3*  MG 2.4  --   --  2.2 2.2  PHOS  --  4.2  --  3.8 3.7    CBC  Recent Labs Lab 09/13/15 0059 09/14/15 0511 09/15/15 0444  WBC 14.1* 15.6* 18.3*  HGB 8.3* 8.0* 8.3*  HCT 28.2* 27.1* 28.1*  PLT 273 252 274    Coag's  Recent Labs Lab 09/09/15 0016 09/15/15 0444  APTT 115*  --    INR  --  1.43    ABG  Recent Labs Lab 09/10/15 2049 09/14/15 0350 09/15/15 0344  PHART 7.504* 7.527* 7.581*  PCO2ART 34.0* 37.2 36.3  PO2ART 59.0* 85.5 85.5    Liver Enzymes  Recent Labs Lab 09/09/15 0230  AST 61*  ALT 83*  ALKPHOS 137*  BILITOT 1.4*  ALBUMIN 2.1*    Glucose  Recent Labs Lab 09/14/15 0745 09/14/15 1222 09/14/15 1638 09/14/15 2002 09/15/15 0024 09/15/15 0328  GLUCAP 163* 170* 161* 170* 153* 152*    Imaging Dg Chest Port 1 View  09/15/2015  CLINICAL DATA:  Acute respiratory failure. Status post cardiac arrest. Aspiration pneumonia. EXAM: PORTABLE CHEST 1 VIEW COMPARISON:  09/14/2015 FINDINGS: Support lines and tubes in appropriate position. No pneumothorax visualized. Mild diffuse heterogeneous bilateral airspace disease shows no significant change. Cardiomegaly stable. Prior CABG again noted. IMPRESSION: Stable mild diffuse heterogeneous airspace disease and cardiomegaly. No pneumothorax visualized. Electronically Signed   By: Earle Gell M.D.   On: 09/15/2015 07:26     STUDIES:  CT head 1/7 >> mild atrophy LHC 1/7 >> no intervention but severe LV dysfunction and severe native and graft disease EEG 1/9 >> Abnormal with moderate generalized nonspecific continuous slowing of cerebral activity, no epileptic disorder  CULTURES: 1/7 MRSA >>  NEG 1/7 Urine Cx >> NEG 1/7 Blood Cx >> NEG  1/7 Tracheal Cx >> ? If done >> 1/16 Blood >>  1/16 Respiratory >> Gram neg rods 1/16 Urine >>  ANTIBIOTICS: Vancomycin 1/9 >> 1/11 Zosyn 1/9 >> 1/11 unasyn 1/11 >> 1/15 Vanc 1/17 >> Ceftaz 1/17 >>  SIGNIFICANT EVENTS: 01/07  cardiac arrest 01/08  shock 01/09  rewarm 01/10  follows commands 01/11  dnr established 01/16  fevers overnight   LINES/TUBES: 1/7 ETT >> 1/7 Left IJ >>  1/7 Rt rad a line >> 1/12  DISCUSSION: 71 y/o with hx severe CAD who was shoveling snow and collapsed, 15 min Vfib arrest. Cath no culprit, medical mgmt. Difficult  weaning, frequent PVC's, bradycardia, AF with RVR.   ASSESSMENT / PLAN:  PULMONARY A: VDRF s/p Cardiac Arrest Suspected aspiration ?Pulmonary Edema P:   Wean PEEP/FiO2 for sats > 92%. Daily SBT Decrease RR to 12 Decrease Lasix to 40 bid  CARDIOVASCULAR A:  Post cardiac arrest while shoveling snow. CPR 10 min epi x 1 LHC with severe native and bypass disease without clear culprit for infarct Hx CAD s/p CABG  CHF; Co-ox improved to 57 Shock (cardiogenic) - resolved Relative AI Atrial fibrillation w/ RVR Severe CAD - no intervention options, medical management  P:  Cardiology following Continue ASA, Plavix  Continue steroids Lasix as above Continue Amiodarone, Digoxin  RENAL A:   AKI Hypokalemia Hypernatremia; slight improvement Metabolic Alkalosis; Contraction vs GI losses P:   BMP in AM. Supplement electrolytes as indicated. Negative goals Decrease Lasix 40 mg IV bid KVO IVF  GASTROINTESTINAL A:   GI PPx Nutrition P:   PPI Continue TF per protocol  Reglan 5 mg IV q6h  HEMATOLOGIC A:   Anticoagulation per cardiology  Leukocytosis Hematuria; resolved P:  Trend CBC  See ID  INFECTIOUS A:   Aspiration Pneumonia Continued Fever P:   Continue Vanc/Ceftaz Follow cultures  ENDOCRINE A:   Hyperglycemia Sick euthyroid P:   SSI Lantus 10 units Q24 Repeat TSH 4-6 weeks  NEUROLOGIC A:   Acute Encephalopathy EEG abnormal showing moderate generalized nonspecific slowing  P:   RASS goal: 0 Precedex gtt, Fentanyl gtt  Versed prn Daily WUA  [name], MD PGY-3, Internal Medicine Pager: [phone]  Attending Note:  70 year old male with extensive CAD presenting after an arrest.  Currently weaning and doing relatively ok.  On exam, coarse BS bilaterally.  Will decrease lasix today and give 2 doses of diamox.  Replace K.  Begin PS trials.  Ideally would like to extubate by tomorrow.  Discussed with wife.  She would like to take records to [facility]  for a second opinion.  Dr. Aundra Dubin is assisting with that.  Will maintain on PS trials today and monitor.  The patient is critically ill with multiple organ systems failure and requires high complexity decision making for assessment and support, frequent evaluation and titration of therapies, application of advanced monitoring technologies and extensive interpretation of multiple databases.   Critical Care Time devoted to patient care services described in this note is  35  Minutes. This time reflects time of care of this signee Dr Jennet Maduro. This critical care time does not reflect procedure time, or teaching time or supervisory time of PA/NP/Med student/Med Resident etc but could involve care discussion time.  Rush Farmer, M.D. [facility] Pulmonary/Critical Care Medicine. Pager: [phone]. After hours pager: [phone].

## 2015-09-15 NOTE — Plan of Care (Signed)
Problem: Phase I Progression Outcomes Goal: Cool target temp reached 2 hrs from start Outcome: Not Met (add Reason) Arrived ED 1515, to Cath LAb 1647, Achieved 33C 2111 Ice packs were applied after ED phase.

## 2015-09-15 NOTE — Progress Notes (Signed)
[facility] ADULT ICU REPLACEMENT PROTOCOL FOR AM LAB REPLACEMENT ONLY  The patient does apply for the [facility] Adult ICU Electrolyte Replacment Protocol based on the criteria listed below:   1. Is GFR >/= 40 ml/min? Yes.    Patient's GFR today is 48 2. Is urine output >/= 0.5 ml/kg/hr for the last 6 hours? Yes.   Patient's UOP is 1.95 ml/kg/hr 3. Is BUN < 60 mg/dL? Yes.    Patient's BUN today is 46 4. Abnormal electrolyte  K 2.8 5. Ordered repletion with: per protocol 6. If a panic level lab has been reported, has the CCM MD in charge been notified? Yes.  .   Physician:  Frederik Schmidt, Canary Brim 09/15/2015 5:27 AM

## 2015-09-16 ENCOUNTER — Inpatient Hospital Stay (HOSPITAL_COMMUNITY): Payer: Medicare Other

## 2015-09-16 DIAGNOSIS — I481 Persistent atrial fibrillation: Secondary | ICD-10-CM

## 2015-09-16 LAB — POCT I-STAT 3, ART BLOOD GAS (G3+)
ACID-BASE EXCESS: 6 mmol/L — AB (ref 0.0–2.0)
BICARBONATE: 29.9 meq/L — AB (ref 20.0–24.0)
O2 Saturation: 99 %
PCO2 ART: 36.8 mmHg (ref 35.0–45.0)
PH ART: 7.518 — AB (ref 7.350–7.450)
PO2 ART: 108 mmHg — AB (ref 80.0–100.0)
Patient temperature: 98.6
TCO2: 31 mmol/L (ref 0–100)

## 2015-09-16 LAB — CULTURE, RESPIRATORY

## 2015-09-16 LAB — GLUCOSE, CAPILLARY
GLUCOSE-CAPILLARY: 144 mg/dL — AB (ref 65–99)
GLUCOSE-CAPILLARY: 169 mg/dL — AB (ref 65–99)
GLUCOSE-CAPILLARY: 194 mg/dL — AB (ref 65–99)
Glucose-Capillary: 132 mg/dL — ABNORMAL HIGH (ref 65–99)
Glucose-Capillary: 144 mg/dL — ABNORMAL HIGH (ref 65–99)
Glucose-Capillary: 153 mg/dL — ABNORMAL HIGH (ref 65–99)
Glucose-Capillary: 206 mg/dL — ABNORMAL HIGH (ref 65–99)

## 2015-09-16 LAB — CBC
HEMATOCRIT: 19.6 % — AB (ref 39.0–52.0)
HEMOGLOBIN: 5.8 g/dL — AB (ref 13.0–17.0)
MCH: 25.4 pg — AB (ref 26.0–34.0)
MCHC: 29.6 g/dL — AB (ref 30.0–36.0)
MCV: 86 fL (ref 78.0–100.0)
Platelets: 226 10*3/uL (ref 150–400)
RBC: 2.28 MIL/uL — ABNORMAL LOW (ref 4.22–5.81)
RDW: 17.1 % — AB (ref 11.5–15.5)
WBC: 12.3 10*3/uL — ABNORMAL HIGH (ref 4.0–10.5)

## 2015-09-16 LAB — CULTURE, RESPIRATORY W GRAM STAIN

## 2015-09-16 LAB — BASIC METABOLIC PANEL
Anion gap: 9 (ref 5–15)
BUN: 49 mg/dL — AB (ref 6–20)
CALCIUM: 7.2 mg/dL — AB (ref 8.9–10.3)
CO2: 31 mmol/L (ref 22–32)
CREATININE: 1.31 mg/dL — AB (ref 0.61–1.24)
Chloride: 107 mmol/L (ref 101–111)
GFR calc Af Amer: >60 mL/min (ref >60)
GFR, EST NON AFRICAN AMERICAN: 54 mL/min — AB (ref >60)
GLUCOSE: 231 mg/dL — AB (ref 65–99)
POTASSIUM: 3 mmol/L — AB (ref 3.5–5.1)
SODIUM: 147 mmol/L — AB (ref 135–145)

## 2015-09-16 LAB — HEPARIN LEVEL (UNFRACTIONATED)
HEPARIN UNFRACTIONATED: 0.72 [IU]/mL — AB (ref 0.30–0.70)
HEPARIN UNFRACTIONATED: 0.9 [IU]/mL — AB (ref 0.30–0.70)

## 2015-09-16 LAB — CARBOXYHEMOGLOBIN
Carboxyhemoglobin: 1.6 % — ABNORMAL HIGH (ref 0.5–1.5)
Methemoglobin: 0.6 % (ref 0.0–1.5)
O2 Saturation: 50.3 %
TOTAL HEMOGLOBIN: 8.4 g/dL — AB (ref 13.5–18.0)

## 2015-09-16 LAB — HEMOGLOBIN AND HEMATOCRIT, BLOOD
HCT: 28.1 % — ABNORMAL LOW (ref 39.0–52.0)
HEMOGLOBIN: 8.2 g/dL — AB (ref 13.0–17.0)

## 2015-09-16 LAB — TYPE AND SCREEN
ABO/RH(D): O POS
Antibody Screen: NEGATIVE

## 2015-09-16 LAB — MAGNESIUM: Magnesium: 2.4 mg/dL (ref 1.7–2.4)

## 2015-09-16 LAB — ABO/RH: ABO/RH(D): O POS

## 2015-09-16 LAB — PHOSPHORUS: PHOSPHORUS: 4.7 mg/dL — AB (ref 2.5–4.6)

## 2015-09-16 MED ORDER — HYDROCORTISONE NA SUCCINATE PF 100 MG IJ SOLR
50.0000 mg | Freq: Two times a day (BID) | INTRAMUSCULAR | Status: DC
  Administered 2015-09-16 – 2015-09-18 (×4): 50 mg via INTRAVENOUS
  Filled 2015-09-16 (×4): qty 2

## 2015-09-16 MED ORDER — CEFAZOLIN SODIUM-DEXTROSE 2-3 GM-% IV SOLR
2.0000 g | Freq: Three times a day (TID) | INTRAVENOUS | Status: DC
  Administered 2015-09-16 – 2015-09-20 (×11): 2 g via INTRAVENOUS
  Filled 2015-09-16 (×14): qty 50

## 2015-09-16 MED ORDER — POTASSIUM CHLORIDE 20 MEQ/15ML (10%) PO SOLN: 40.0000 meq | Freq: Three times a day (TID) | ORAL | Status: DC

## 2015-09-16 MED ORDER — SODIUM CHLORIDE 0.9 % IJ SOLN: 3.0000 mL | INTRAMUSCULAR | Status: DC | PRN

## 2015-09-16 MED ORDER — ALBUTEROL SULFATE (2.5 MG/3ML) 0.083% IN NEBU
2.5000 mg | INHALATION_SOLUTION | Freq: Two times a day (BID) | RESPIRATORY_TRACT | Status: DC
  Administered 2015-09-16 – 2015-09-18 (×4): 2.5 mg via RESPIRATORY_TRACT
  Filled 2015-09-16 (×4): qty 3

## 2015-09-16 MED ORDER — SODIUM CHLORIDE 0.9 % IV SOLN
Freq: Once | INTRAVENOUS | Status: AC
  Administered 2015-09-16: 10 mL/h via INTRAVENOUS

## 2015-09-16 MED ORDER — ACETAZOLAMIDE SODIUM 500 MG IJ SOLR
500.0000 mg | Freq: Two times a day (BID) | INTRAMUSCULAR | Status: AC
  Administered 2015-09-16 (×2): 500 mg via INTRAVENOUS
  Filled 2015-09-16 (×2): qty 500

## 2015-09-16 MED ORDER — HEPARIN BOLUS VIA INFUSION
4000.0000 [IU] | Freq: Once | INTRAVENOUS | Status: AC
  Administered 2015-09-16: 4000 [IU] via INTRAVENOUS
  Filled 2015-09-16: qty 4000

## 2015-09-16 MED ORDER — SODIUM CHLORIDE 0.9 % IJ SOLN
3.0000 mL | Freq: Two times a day (BID) | INTRAMUSCULAR | Status: DC
  Administered 2015-09-16 – 2015-09-20 (×6): 3 mL via INTRAVENOUS

## 2015-09-16 MED ORDER — HEPARIN (PORCINE) IN NACL 100-0.45 UNIT/ML-% IJ SOLN
1400.0000 [IU]/h | INTRAMUSCULAR | Status: DC
  Administered 2015-09-16: 1500 [IU]/h via INTRAVENOUS
  Administered 2015-09-17: 1400 [IU]/h via INTRAVENOUS
  Filled 2015-09-16 (×2): qty 250

## 2015-09-16 MED ORDER — FUROSEMIDE 10 MG/ML IJ SOLN
80.0000 mg | Freq: Two times a day (BID) | INTRAMUSCULAR | Status: DC
  Administered 2015-09-16 – 2015-09-17 (×4): 80 mg via INTRAVENOUS
  Filled 2015-09-16 (×4): qty 8

## 2015-09-16 MED ORDER — POTASSIUM CHLORIDE 20 MEQ/15ML (10%) PO SOLN
40.0000 meq | Freq: Three times a day (TID) | ORAL | Status: AC
  Administered 2015-09-16 (×3): 40 meq via ORAL
  Filled 2015-09-16 (×3): qty 30

## 2015-09-16 MED ORDER — SODIUM CHLORIDE 0.9 % IV SOLN: 250.0000 mL | INTRAVENOUS | Status: DC

## 2015-09-16 NOTE — Progress Notes (Signed)
Patients spouse is concerned about her brother-in-law. Spouse states that she is ok with brother-in-law receiving update. However, she doesn't want the brother to speak with physicians about her husbands care.

## 2015-09-16 NOTE — Progress Notes (Signed)
ANTICOAGULATION CONSULT NOTE - Follow Up Consult  Pharmacy Consult for heparin Indication: atrial fibrillation  No Known Allergies  Patient Measurements: Height: 5\' 9"  (175.3 cm) Weight: 168 lb 3.4 oz (76.3 kg) IBW/kg (Calculated) : 70.7 Heparin Dosing Weight:   Vital Signs: Temp: 99.9 F (37.7 C) (01/19 0719) Temp Source: Oral (01/19 0719) BP: 105/64 mmHg (01/19 0600) Pulse Rate: 67 (01/19 0600)  Labs:  Recent Labs  09/14/15 0511 09/15/15 0444 09/15/15 2050 09/16/15 0500 09/16/15 0610  HGB 8.0* 8.3*  --  5.8* 8.2*  HCT 27.1* 28.1*  --  19.6* 28.1*  PLT 252 274  --  226  --   LABPROT  --  17.5*  --   --   --   INR  --  1.43  --   --   --   CREATININE 1.36* 1.44* 1.40* 1.31*  --     Estimated Creatinine Clearance: 53.2 mL/min (by C-G formula based on Cr of 1.31).  Assessment: AC/Heme: PAF s/p hypothermia protocol. Hep stopped d/t bleeding issues 1/14 hematuria and bleeding from [facility], currently using SCDs.  Hgb low 5.8 this am, repeat Hgb stable in 8s-plan for dccv in am, start heparin this am.  Goal of Therapy:  Heparin level 0.3-0.7 units/ml Monitor platelets by anticoagulation protocol: Yes   Plan:  Give 4000 units bolus x 1 Start heparin infusion at 1500 units/hr Check anti-Xa level in 6 hours and daily while on heparin Continue to monitor H&H and platelets  Erin Hearing PharmD., BCPS Clinical Pharmacist Pager [phone] 09/16/2015 1:43 PM

## 2015-09-16 NOTE — Progress Notes (Signed)
Pharmacy Antibiotic Follow-up Note  Patrick Sexton [facility] is a 70 y.o. year-old male admitted on 09/05/2015.  The patient is currently on day 3 of antibiotics for pneumonia. The sputum culture grew Klebsiella Pneumonia resistant to only ampicillin  Assessment/Plan: After discussion with Dr. Ronnald Ramp, the current antibiotic(s) vancomycin and ceftazidime will be narrowed to cefazolin. The recommended duration of therapy should be 7 days. We will follow-up with stopping antibiotics in a few days with continued clinical improvement  Temp (24hrs), Avg:99.3 F (37.4 C), Min:98.3 F (36.8 C), Max:101.4 F (38.6 C)   Recent Labs Lab 09/12/15 0421 09/13/15 0059 09/14/15 0511 09/15/15 0444 09/16/15 0500  WBC 14.1* 14.1* 15.6* 18.3* 12.3*    Recent Labs Lab 09/13/15 1721 09/14/15 0511 09/15/15 0444 09/15/15 2050 09/16/15 0500  CREATININE 1.27* 1.36* 1.44* 1.40* 1.31*   Estimated Creatinine Clearance: 53.2 mL/min (by C-G formula based on Cr of 1.31).    No Known Allergies  Antimicrobials this admission: Unasyn 1/8 >> 1/9;; 1/11 >> 1/15 Vancomycin 1/9 >> 1/11, 1/17>>1/19 Zosyn 1/9 >> 1/11 Fortaz 1/17>>1/19 Cefazolin 1/19>>  Levels/dose changes this admission: None  Microbiology results: 1/7 MRSA PCR negative 1/7 BCx x2: NG final 1/7 UCx: NG final 1/16 BCx x2: ngtd 1/16 UCx: ng 1/16 trach aspir: kleb pneumonia (r to ampicillin)  Thank you for allowing pharmacy to be a part of this patient's care.  Levester Fresh, PharmD, BCPS, [facility] Clinical Pharmacist Pager [phone] 09/16/2015 10:12 AM

## 2015-09-16 NOTE — Progress Notes (Signed)
CRITICAL VALUE ALERT  Critical value received:  Hemoglobin 5.8  Date of notification: 09/16/15  Time of notification:  5:45  Critical value read back:Yes.    Nurse who received alert:  Cleotis Nipper, RN  MD notified (1st page):  [email or web address]  Time of first page:  5:50  Responding MD:  E-link  Time MD responded:  5:50

## 2015-09-16 NOTE — Progress Notes (Addendum)
PULMONARY / CRITICAL CARE MEDICINE   Name: Patrick Sexton MRN: [id number] DOB: [birth year]    ADMISSION DATE:  09/11/2015  REFERRING MD:  EDP  CHIEF COMPLAINT:  Cardiac arrest    SUBJECTIVE:  Continued fever overnight as high as 101.4 F.    VITAL SIGNS: BP 109/61 mmHg  Pulse 84  Temp(Src) 99.9 F (37.7 C) (Oral)  Resp 31  Ht '5\' 9"'$  (1.753 m)  Wt 168 lb 3.4 oz (76.3 kg)  BMI 24.83 kg/m2  SpO2 100%  HEMODYNAMICS: CVP:  [5 mmHg-11 mmHg] 11 mmHg  VENTILATOR SETTINGS: Vent Mode:  [-] PSV;CPAP FiO2 (%):  [40 %] 40 % Set Rate:  [12 bmp] 12 bmp Vt Set:  [550 mL] 550 mL PEEP:  [5 cmH20] 5 cmH20 Pressure Support:  [8 cmH20-10 cmH20] 10 cmH20 Plateau Pressure:  [19 cmH20-20 cmH20] 19 cmH20  INTAKE / OUTPUT: I/O last 3 completed shifts: In: 5234.2 [I.V.:2211.2; NG/GT:2223; IV OEUMPNTIR:443] Out: 6110 [Urine:6110]  PHYSICAL EXAMINATION: General: Intubated, sedated.  Neuro: RASS -1. Sedated, follows commands. Moves all extremities spontaneously.  HEENT: PERRL, ETT/OGT in place. Moist mucus membranes.  Cardiovascular: Regular rate, irregular rhythm. No murmurs.  Lungs: Air entry equal, coarse breath sounds throughout. No wheezes.  Abdomen:  Soft, non-tender, non-distended. BS + Musculoskeletal: Intact.  Skin: Cool, no rashes.   LABS:  BMET  Recent Labs Lab 09/15/15 0444 09/15/15 2050 09/16/15 0500  NA 150* 145 147*  K 2.8* 2.6* 3.0*  CL 103 98* 107  CO2 35* 32 31  BUN 46* 44* 49*  CREATININE 1.44* 1.40* 1.31*  GLUCOSE 173* 376* 231*    Electrolytes  Recent Labs Lab 09/14/15 0511 09/15/15 0444 09/15/15 2050 09/16/15 0500  CALCIUM 7.3* 7.3* 6.5* 7.2*  MG 2.2 2.2  --  2.4  PHOS 3.8 3.7  --  4.7*    CBC  Recent Labs Lab 09/14/15 0511 09/15/15 0444 09/16/15 0500 09/16/15 0610  WBC 15.6* 18.3* 12.3*  --   HGB 8.0* 8.3* 5.8* 8.2*  HCT 27.1* 28.1* 19.6* 28.1*  PLT 252 274 226  --     Coag's  Recent Labs Lab 09/15/15 0444  INR 1.43     ABG  Recent Labs Lab 09/15/15 0344 09/15/15 1153 09/16/15 0337  PHART 7.581* 7.544* 7.518*  PCO2ART 36.3 44.0 36.8  PO2ART 85.5 110.0* 108.0*     Glucose  Recent Labs Lab 09/15/15 1109 09/15/15 1625 09/15/15 1945 09/16/15 0019 09/16/15 0511 09/16/15 0815  GLUCAP 138* 138* 197* 144* 194* 153*    Imaging Dg Chest Port 1 View  09/16/2015  CLINICAL DATA:  Hypoxia EXAM: PORTABLE CHEST 1 VIEW COMPARISON:  September 15, 2015 FINDINGS: Endotracheal tube tip is 2.9 cm above the carina. Central catheter tip is in the superior vena cava. Nasogastric tube tip and side port below the diaphragm. No pneumothorax. There is mild generalized interstitial edema, unchanged. There is mild patchy bibasilar airspace consolidation, stable. No new opacity. Heart is prominent with pulmonary vascularity within normal limits, stable. No adenopathy. Patient is status post coronary artery bypass grafting. IMPRESSION: Tube and catheter positions as described without pneumothorax. Interstitial edema with patchy bibasilar airspace opacity, stable. No new opacity. Stable cardiac silhouette. Electronically Signed   By: Lowella Grip III M.D.   On: 09/16/2015 07:30     STUDIES:  CT head 1/7 >> mild atrophy LHC 1/7 >> no intervention but severe LV dysfunction and severe native and graft disease EEG 1/9 >> Abnormal with moderate generalized nonspecific continuous slowing of  cerebral activity, no epileptic disorder  CULTURES: 1/7 MRSA >> NEG 1/7 Urine Cx >> NEG 1/7 Blood Cx >> NEG  1/7 Tracheal Cx >> ? If done >> 1/16 Blood >>  1/16 Respiratory >> Pan-sensitive Klebsiella 1/16 Urine >>   ANTIBIOTICS: Vancomycin 1/9 >> 1/11 Zosyn 1/9 >> 1/11 unasyn 1/11 >> 1/15 Vanc 1/17 >> 1/19 Ceftaz 1/17 >>  SIGNIFICANT EVENTS: 01/07  cardiac arrest 01/08  shock 01/09  rewarm 01/10  follows commands 01/11  dnr established 01/16  fevers overnight   LINES/TUBES: 1/7 ETT >> 1/7 Left IJ >>  1/7 Rt rad  a line >> 1/12  DISCUSSION: 70 y/o with hx severe CAD who was shoveling snow and collapsed, 15 min Vfib arrest. Cath no culprit, medical mgmt. Difficult weaning, frequent PVC's, bradycardia, AF with RVR.   ASSESSMENT / PLAN:  PULMONARY A: VDRF s/p Cardiac Arrest Suspected aspiration vs HCAP ?Pulmonary Edema P:   Wean PEEP/FiO2 for sats > 92%. Daily SBT; did well on wean this AM Continue Lasix per CHF team See ID  CARDIOVASCULAR A:  Post cardiac arrest while shoveling snow. CPR 10 min epi x 1 LHC with severe native and bypass disease without clear culprit for infarct Hx CAD s/p CABG  CHF; Co-ox improved to 57 Shock (cardiogenic) - resolved Relative AI Atrial fibrillation w/ RVR Severe CAD - no intervention options, medical management  P:  Cardiology following Started Heparin gtt with plan for TEE DCCV in AM Continue ASA, Plavix  Decrease steroids to 50 q12h Lasix as above Continue Amiodarone, Digoxin  RENAL A:   AKI Hypokalemia Hypernatremia; improving Metabolic Alkalosis; Contraction vs GI losses P:   BMP in AM Supp K; mag ok Negative goals Lasix as above KVO IVF Continue Diamox  GASTROINTESTINAL A:   GI PPx Nutrition P:   PPI Continue TF per protocol  Reglan 5 mg IV q6h  HEMATOLOGIC A:   Anticoagulation per cardiology  Leukocytosis Hematuria; resolved P:  Start Heparin gtt for plan DCCV in AM Trend CBC  See ID  INFECTIOUS A:   Klebsiella Pneumonia Continued Fever P:   Change to Ancef D/c Vanc Continue to follow cultures  ENDOCRINE A:   Hyperglycemia Sick euthyroid P:   SSI Lantus 10 units Q24 Repeat TSH 4-6 weeks  NEUROLOGIC A:   Acute Encephalopathy EEG abnormal showing moderate generalized nonspecific slowing  P:   RASS goal: 0 Precedex gtt, Fentanyl gtt  Versed prn Daily WUA  [name], MD PGY-3, Internal Medicine Pager: [phone]  Attending Note:  70 year old male s/p cardiac arrest, intubated, weaning, on  exam decreased BS diffusely, continue diureses, add diamox, change abx to ancef for klebsiella pneumonia, d/c vanc.  Plan is to cardiovert in AM then extubate with no intention to reintubate.  Wife met with palliative care, will continue current care.  CXR I reviewed myself and ETT ok, infiltrate noted.  The patient is critically ill with multiple organ systems failure and requires high complexity decision making for assessment and support, frequent evaluation and titration of therapies, application of advanced monitoring technologies and extensive interpretation of multiple databases.   Critical Care Time devoted to patient care services described in this note is  35  Minutes. This time reflects time of care of this signee Dr Patrick Sexton. This critical care time does not reflect procedure time, or teaching time or supervisory time of PA/NP/Med student/Med Resident etc but could involve care discussion time.  Patrick Sexton, M.D. [facility] Pulmonary/Critical Care Medicine.  Pager: [phone]. After hours pager: [phone].

## 2015-09-16 NOTE — Progress Notes (Signed)
ANTICOAGULATION CONSULT NOTE - Follow Up Consult  Pharmacy Consult for heparin Indication: atrial fibrillation  No Known Allergies  Patient Measurements: Height: 5\' 9"  (175.3 cm) Weight: 168 lb 3.4 oz (76.3 kg) IBW/kg (Calculated) : 70.7 Heparin Dosing Weight:   Vital Signs: Temp: 98 F (36.7 C) (01/19 1600) Temp Source: Axillary (01/19 1600) BP: 108/61 mmHg (01/19 1500) Pulse Rate: 68 (01/19 1500)  Labs:  Recent Labs  09/14/15 0511 09/15/15 0444 09/15/15 2050 09/16/15 0500 09/16/15 0610 09/16/15 1545  HGB 8.0* 8.3*  --  5.8* 8.2*  --   HCT 27.1* 28.1*  --  19.6* 28.1*  --   PLT 252 274  --  226  --   --   LABPROT  --  17.5*  --   --   --   --   INR  --  1.43  --   --   --   --   HEPARINUNFRC  --   --   --   --   --  0.72*  CREATININE 1.36* 1.44* 1.40* 1.31*  --   --     Estimated Creatinine Clearance: 53.2 mL/min (by C-G formula based on Cr of 1.31).  Assessment: AC/Heme: PAF s/p hypothermia protocol. Hep stopped d/t bleeding issues 1/14 hematuria and bleeding from [facility], currently using SCDs.  Plan for dccv in am, restarted heparin this am.  Heparin level 0.72 on heparin 1500 un/hr, will decrease slightly and recheck a level in 6 hours  Goal of Therapy:  Heparin level 0.3-0.7 units/ml Monitor platelets by anticoagulation protocol: Yes   Plan:  Decrease heparin infusion to 1400 units/hr Check anti-Xa level in 6 hours and daily while on heparin Continue to monitor H&H and platelets  Thank you for allowing [place] to participate in this patients care. Jens Som, PharmD  09/16/2015 5:01 PM

## 2015-09-16 NOTE — Progress Notes (Signed)
Advanced Heart Failure Consult Note  Referring MD: Dr Elsworth Soho PCP: Carmon Ginsberg, PA Primary Cardiologist: Dr Clayborn Bigness  Subjective:    SIGNIFICANT EVENTS: 01/07 cardiac arrest/intubated/sedated/emergent cath 01/08 shock 01/09 rewarm 01/10 follows commands 01/11 dnr established, [facility] signed off 01/16 fevers overnight, BCx sent. HF team asked to see. Fever spike 103, UA sent.  STUDIES:  CT head 1/7>> mild atrophy LHC 1/7>> no intervention but severe LV dysfunction and severe native and graft disease CXR 1/9>> EEG 1/9>>Abnormal with moderate generalized nonspecific continuous slowing of cerebral activity, no epileptic disorder Echo 09/13/15>> LVEF 30-35%, mildly reduce RV, mild MR, mod TR, PA peak pressure 60 mm Hg  Coox 50.3%.  Out 1.4 L net yesterday. Weight stable. K evening of 1/18 2.6, supped by Elink MD. Remains 3.0 this am. Supp ordered. Critical value Hgb this morning at 5.8. Stat repeat 8.2. Type and screen ordered for possible transfusion.  Tmax 101.4.  98.3 this am.     Creatinine trending down slightly 1.44 => 1.40 => 1.31. Ongoing attempt at weaning from ETT. Fentanyl now at 175.   Objective:   Weight Range: 168 lb 3.4 oz (76.3 kg) Body mass index is 24.83 kg/(m^2).   Vital Signs:   Temp:  [98.3 F (36.8 C)-101.4 F (38.6 C)] 98.3 F (36.8 C) (01/19 0400) Pulse Rate:  [63-92] 67 (01/19 0600) Resp:  [12-37] 16 (01/19 0600) BP: (87-110)/(49-83) 105/64 mmHg (01/19 0600) SpO2:  [98 %-100 %] 100 % (01/19 0600) FiO2 (%):  [40 %] 40 % (01/19 0321) Weight:  [168 lb 3.4 oz (76.3 kg)] 168 lb 3.4 oz (76.3 kg) (01/19 0400) Last BM Date: 09/14/15  Weight change: Filed Weights   09/14/15 0500 09/15/15 0437 09/16/15 0400  Weight: 173 lb 4.5 oz (78.6 kg) 168 lb 14 oz (76.6 kg) 168 lb 3.4 oz (76.3 kg)    Intake/Output:   Intake/Output Summary (Last 24 hours) at 09/16/15 0709 Last data filed at 09/16/15 [id number]  Gross per 24 hour  Intake 3543.02 ml  Output   3610  ml  Net -66.98 ml     Physical Exam: CVP: 9-10 General: Chronically ill appearing, intubated and sedated. HEENT: Remains on vent.  Neck: supple. JVP appears elevated . Carotids 2+ bilat; no bruits. No thyromegaly or nodule noted. Cor: PMI nondisplaced. Irregularly irregular. Lungs: Mechanical breath sounds.  Abdomen: soft, non-distended, no HSM. No bruits or masses. +BS  Extremities: no cyanosis, clubbing, rash. Trace-1+ LE edema bilaterally.  Neuro: Intubated, sedated on fentanyl currently at decreasing doses. Intermittently follows commands. Asleep and stirs on my exam.   Telemetry: Reviewed personally, Afib 60s with LBBB  Labs: CBC  Recent Labs  09/15/15 0444 09/16/15 0500  WBC 18.3* 12.3*  HGB 8.3* 5.8*  HCT 28.1* 19.6*  MCV 84.9 86.0  PLT 274 [id number]   Basic Metabolic Panel  Recent Labs  09/15/15 0444 09/15/15 2050 09/16/15 0500  NA 150* 145 147*  K 2.8* 2.6* 3.0*  CL 103 98* 107  CO2 35* 32 31  GLUCOSE 173* 376* 231*  BUN 46* 44* 49*  CREATININE 1.44* 1.40* 1.31*  CALCIUM 7.3* 6.5* 7.2*  MG 2.2  --  2.4  PHOS 3.7  --  4.7*   Liver Function Tests No results for input(s): AST, ALT, ALKPHOS, BILITOT, PROT, ALBUMIN in the last 72 hours. No results for input(s): LIPASE, AMYLASE in the last 72 hours. Cardiac Enzymes No results for input(s): CKTOTAL, CKMB, CKMBINDEX, TROPONINI in the last 72 hours.  BNP: BNP (last 3  results)  Recent Labs  09/19/2015 1520 09/07/15 1057  BNP 235.5* 670.0*    ProBNP (last 3 results) No results for input(s): PROBNP in the last 8760 hours.   D-Dimer No results for input(s): DDIMER in the last 72 hours. Hemoglobin A1C No results for input(s): HGBA1C in the last 72 hours. Fasting Lipid Panel  Recent Labs  09/19/2015 1019  TRIG 70   Thyroid Function Tests No results for input(s): TSH, T4TOTAL, T3FREE, THYROIDAB in the last 72 hours.  Invalid input(s): FREET3  Other results:     Imaging/Studies:  Dg Chest Port  1 View  09/19/15  CLINICAL DATA:  Acute respiratory failure. Status post cardiac arrest. Aspiration pneumonia. EXAM: PORTABLE CHEST 1 VIEW COMPARISON:  09/14/2015 FINDINGS: Support lines and tubes in appropriate position. No pneumothorax visualized. Mild diffuse heterogeneous bilateral airspace disease shows no significant change. Cardiomegaly stable. Prior CABG again noted. IMPRESSION: Stable mild diffuse heterogeneous airspace disease and cardiomegaly. No pneumothorax visualized. Electronically Signed   By: Earle Gell M.D.   On: 09/19/2015 07:26    Latest Echo  Latest Cath   Medications:     Scheduled Medications: . albuterol  2.5 mg Nebulization Q12H  . antiseptic oral rinse  7 mL Mouth Rinse 10 times per day  . aspirin  81 mg Oral Daily  . atorvastatin  80 mg Oral q1800  . cefTAZidime (FORTAZ)  IV  2 g Intravenous Q12H  . chlorhexidine gluconate  15 mL Mouth Rinse BID  . clopidogrel  75 mg Oral Daily  . digoxin  0.125 mg Per Tube Daily  . free water  300 mL Per Tube Q6H  . furosemide  40 mg Intravenous BID  . hydrocortisone sod succinate (SOLU-CORTEF) inj  50 mg Intravenous Q6H  . insulin aspart  2-6 Units Subcutaneous 6 times per day  . insulin glargine  10 Units Subcutaneous Q24H  . metoCLOPramide (REGLAN) injection  5 mg Intravenous 4 times per day  . pantoprazole sodium  40 mg Per Tube Daily  . polyethylene glycol  17 g Per Tube Daily  . potassium chloride  40 mEq Oral TID  . senna-docusate  1 tablet Per Tube QHS  . sodium chloride  10-40 mL Intracatheter Q12H  . sodium chloride  3 mL Intravenous Q12H  . vancomycin  750 mg Intravenous Q12H    Infusions: . sodium chloride Stopped (09/13/15 2000)  . amiodarone 30 mg/hr (09/19/15 2057)  . dexmedetomidine 0.7 mcg/kg/hr (09/16/15 0654)  . feeding supplement (VITAL AF 1.2 CAL) 1,000 mL (09/16/15 0200)  . fentaNYL infusion INTRAVENOUS 175 mcg/hr (09/16/15 0631)    PRN Medications: sodium chloride, acetaminophen,  fentaNYL, midazolam, ondansetron (ZOFRAN) IV, pneumococcal 23 valent vaccine, sodium chloride, sodium chloride   Assessment/Plan   Patrick Sexton is a 70 y.o. male with history of CAD s/p CABG x 4 2008, PAF, HTN, Ischemic cardiomyopathy Echo 03/2015 EF 45-50%, and dyslipidemia who [facility] to [facility]19[facility] after cardiac arrest. Emergent cath with severe CAD, EF 25%, and no culprit. Medical management. Remains on vent. HF team asked to see for second opinion regarding treatment of CAD/CHF (i.e. Advanced therapies) and recurrent SVT with attempts to wean vent.  1. Acute respiratory failure, VDRF - Per CCM. They suspect aspiration with patchy infiltrates. - ABX per primary. Continues to have fever TMax 101 - Pan cultures 08/31/2015 negative, re-drawn 09/13/15 with bloody secretions and fever. - Now on Vanc/Ceftaz. - Attempting extubation today per CCM.  2. Severe CAD s/p cardiac arrest. CPR  x 10 minutes with epi x 1. - Emergent cath 09/19/2015 with severe native and bypass disease with no clear culprit. - Not candidate for PCI or CABG redo.  - EEG 09/06/15 Abnormal with moderate generalized nonspecific continuous slowing of cerebral activity 3. Acute Systolic HF, EF [id number] by Echo, Ischemic CM - in setting of cardiac arrest - Coox low/marginal this am at 50.3% this am. Good UO yesterday with 80 mg am/ 40 mg pm IV lasix.  - Creatinine improved but BUN trending up. CVP 11, needs further diuresis. Will repeat 80 mg IV BID today.  - Echo 1/16 LVEF 30-35%, mildly reduce RV, mild MR, mod TR, PA peak pressure 60 mm Hg 4. PAF - Rates now in 60s on amio. Hope rates will be more tolerant of extubation.  - Remains off Off heparin gtt with urethral bleeding into foley. Won't add back today with Anemia. CBC repeat pending. - Will need Hgb to stabilize if attempt at DCCV to be made. Would need anticoag.  Repeat hgb 8.2 - CHA2DS2-VASc = 5 - Supp K for goal > 4.0.  6. Endocrine - Per CCM 7.AKI on CKD stage II - Continue  to follow closely with diuresis. 8. Hypernatremia - Improving. Now 147 this am.   - Per CCM, free water boluses 9. ID - UA negative, few bacteria, + protein - Remains febrile. Tmax 101.4. WBC 14.1 => 15.6 => 18.3 => 12.3 - On Ceftaz/vanc as of 09/13/15 with fevers into 103. 10. Palliative Care  - Following  Length of Stay: [address]  Shirley Friar PA-C 09/16/2015, 7:09 AM  Advanced Heart Failure Team Pager [phone] (M-F; 7a - 4p)  Please contact [facility] for night-coverage after hours (4p -7a ) and weekends on [email or web address]  Patient seen with PA, agree with the above note.  1. Acute hypoxemic respiratory failure: Bilateral airspace disease, suspect aspiration post-arrest. Also suspect pulmonary edema, CVP 11 today on my measurement. Fever curve trending down, one spike to 101 last night.  - Needs more diuresis => Lasix 80 mg IV bid today.  Continue acetazolamide, HCO3 level looks fine this morning though he has respiratory alkalosis.   - Antibiotic coverage with vanco/ceftazidime.  - Suspect he can be extubated soon, would like to do TEE-guided cardioversion first however.    2. CAD: Severe native vessel disease with LIMA-LAD as the only functional graft. Based on the appearance of the cath, his arrest may have been scar-mediated VF rather than primary plaque rupture/ACS. No interventional or redo CABG option.  - Continue ASA 81, atorvastatin.  - Plan to start heparin gtt today so will stop Plavix with no recent PCI.  3. Acute on chronic systolic CHF: LV-gram with EF 25%, echo 09/13/15 with EF 30-35%. Co-ox lower today at 50%. Started digoxin. CVP 11. - Recheck co-ox this morning.  - Continue digoxin - Lasix 80 mg IV bid today along with acetazolamide.  4. Atrial fibrillation: Patient is currently in atrial fibrillation with rate improved in the 70s on amiodarone gtt.  - Continue amiodarone and digoxin for rate control. - No further bloody secretions from ET  tube or hematuria per nursing, hemoglobin has been stable for several days (initial reading this morning was spurious). Will start heparin gtt this morning, plan TEE-guided DCCV tomorrow am at 7:30 to help with cardiac function now that fever curve seems to be improving. 5. Hypernatremia: Free water boluses continue, sodium with slow downward trend but still high at 147.  6. Renal: Creatinine stable  today.   7. ID: Fever curve and WBCs decreasing, temp to 101 last night. Suspect from lung source with probable aspiration PNA. He was recultured and started on ceftazidime/vancomycin.   Discussed the above with patient's wife.   35 minutes critical care time.   Loralie Champagne 09/16/2015 8:37 AM

## 2015-09-16 NOTE — Progress Notes (Signed)
   09/16/15 1714  Clinical Encounter Type  Visited With Patient not available;Health care provider  Visit Type Follow-up   Chaplain attempted to follow-up with patient and patient's wife, but patient's wife is not present and patient is intubated. Chaplain will seek to continue to follow-up, and our services are available as needed.  Jeri Lager, Chaplain 09/16/2015 5:15 PM

## 2015-09-17 ENCOUNTER — Inpatient Hospital Stay (HOSPITAL_COMMUNITY): Payer: Medicare Other

## 2015-09-17 DIAGNOSIS — G9340 Encephalopathy, unspecified: Secondary | ICD-10-CM

## 2015-09-17 DIAGNOSIS — J9621 Acute and chronic respiratory failure with hypoxia: Secondary | ICD-10-CM

## 2015-09-17 LAB — MAGNESIUM
MAGNESIUM: 2.7 mg/dL — AB (ref 1.7–2.4)
Magnesium: 3.2 mg/dL — ABNORMAL HIGH (ref 1.7–2.4)

## 2015-09-17 LAB — POCT I-STAT 3, ART BLOOD GAS (G3+)
Acid-Base Excess: 4 mmol/L — ABNORMAL HIGH (ref 0.0–2.0)
Bicarbonate: 27.8 mEq/L — ABNORMAL HIGH (ref 20.0–24.0)
O2 SAT: 99 %
PCO2 ART: 37.9 mmHg (ref 35.0–45.0)
PO2 ART: 117 mmHg — AB (ref 80.0–100.0)
Patient temperature: 100.1
TCO2: 29 mmol/L (ref 0–100)
pH, Arterial: 7.476 — ABNORMAL HIGH (ref 7.350–7.450)

## 2015-09-17 LAB — BASIC METABOLIC PANEL
ANION GAP: 8 (ref 5–15)
Anion gap: 11 (ref 5–15)
Anion gap: 12 (ref 5–15)
Anion gap: 8 (ref 5–15)
BUN: 46 mg/dL — AB (ref 6–20)
BUN: 47 mg/dL — AB (ref 6–20)
BUN: 48 mg/dL — AB (ref 6–20)
BUN: 50 mg/dL — AB (ref 6–20)
CALCIUM: 7.7 mg/dL — AB (ref 8.9–10.3)
CALCIUM: 7.4 mg/dL — AB (ref 8.9–10.3)
CALCIUM: 7.5 mg/dL — AB (ref 8.9–10.3)
CHLORIDE: 114 mmol/L — AB (ref 101–111)
CO2: 28 mmol/L (ref 22–32)
CO2: 29 mmol/L (ref 22–32)
CO2: 29 mmol/L (ref 22–32)
CO2: 28 mmol/L (ref 22–32)
CREATININE: 1.23 mg/dL (ref 0.61–1.24)
CREATININE: 1.28 mg/dL — AB (ref 0.61–1.24)
Calcium: 7.2 mg/dL — ABNORMAL LOW (ref 8.9–10.3)
Chloride: 112 mmol/L — ABNORMAL HIGH (ref 101–111)
Chloride: 111 mmol/L (ref 101–111)
Chloride: 107 mmol/L (ref 101–111)
Creatinine, Ser: 1.13 mg/dL (ref 0.61–1.24)
Creatinine, Ser: 1.37 mg/dL — ABNORMAL HIGH (ref 0.61–1.24)
GFR calc Af Amer: >60 mL/min (ref >60)
GFR calc Af Amer: >60 mL/min (ref >60)
GFR calc Af Amer: 59 mL/min — ABNORMAL LOW (ref >60)
GFR calc non Af Amer: >60 mL/min (ref >60)
GFR, EST NON AFRICAN AMERICAN: 58 mL/min — AB (ref >60)
GFR, EST NON AFRICAN AMERICAN: 51 mL/min — AB (ref >60)
GFR, EST NON AFRICAN AMERICAN: 55 mL/min — AB (ref >60)
GLUCOSE: 154 mg/dL — AB (ref 65–99)
GLUCOSE: 194 mg/dL — AB (ref 65–99)
GLUCOSE: 247 mg/dL — AB (ref 65–99)
Glucose, Bld: 178 mg/dL — ABNORMAL HIGH (ref 65–99)
Potassium: 2.5 mmol/L — CL (ref 3.5–5.1)
Potassium: 3.4 mmol/L — ABNORMAL LOW (ref 3.5–5.1)
Potassium: 3.4 mmol/L — ABNORMAL LOW (ref 3.5–5.1)
Potassium: 4.4 mmol/L (ref 3.5–5.1)
SODIUM: 150 mmol/L — AB (ref 135–145)
Sodium: 148 mmol/L — ABNORMAL HIGH (ref 135–145)
Sodium: 151 mmol/L — ABNORMAL HIGH (ref 135–145)
Sodium: 148 mmol/L — ABNORMAL HIGH (ref 135–145)

## 2015-09-17 LAB — CBC
HCT: 27.4 % — ABNORMAL LOW (ref 39.0–52.0)
HEMATOCRIT: 27.9 % — AB (ref 39.0–52.0)
HEMOGLOBIN: 8.3 g/dL — AB (ref 13.0–17.0)
Hemoglobin: 7.8 g/dL — ABNORMAL LOW (ref 13.0–17.0)
MCH: 24.9 pg — AB (ref 26.0–34.0)
MCH: 25.5 pg — ABNORMAL LOW (ref 26.0–34.0)
MCHC: 28.5 g/dL — ABNORMAL LOW (ref 30.0–36.0)
MCHC: 29.7 g/dL — ABNORMAL LOW (ref 30.0–36.0)
MCV: 87.5 fL (ref 78.0–100.0)
MCV: 85.8 fL (ref 78.0–100.0)
PLATELETS: 337 10*3/uL (ref 150–400)
Platelets: 343 10*3/uL (ref 150–400)
RBC: 3.13 MIL/uL — ABNORMAL LOW (ref 4.22–5.81)
RBC: 3.25 MIL/uL — ABNORMAL LOW (ref 4.22–5.81)
RDW: 17.5 % — AB (ref 11.5–15.5)
RDW: 17.7 % — AB (ref 11.5–15.5)
WBC: 16 10*3/uL — AB (ref 4.0–10.5)
WBC: 18.6 10*3/uL — AB (ref 4.0–10.5)

## 2015-09-17 LAB — GLUCOSE, CAPILLARY
GLUCOSE-CAPILLARY: 126 mg/dL — AB (ref 65–99)
GLUCOSE-CAPILLARY: 121 mg/dL — AB (ref 65–99)
GLUCOSE-CAPILLARY: 139 mg/dL — AB (ref 65–99)
Glucose-Capillary: 176 mg/dL — ABNORMAL HIGH (ref 65–99)
Glucose-Capillary: 148 mg/dL — ABNORMAL HIGH (ref 65–99)

## 2015-09-17 LAB — CARBOXYHEMOGLOBIN
CARBOXYHEMOGLOBIN: 1.2 % (ref 0.5–1.5)
Methemoglobin: 0.7 % (ref 0.0–1.5)
O2 SAT: 56.6 %
TOTAL HEMOGLOBIN: 10.8 g/dL — AB (ref 13.5–18.0)

## 2015-09-17 LAB — HEPARIN LEVEL (UNFRACTIONATED)
Heparin Unfractionated: 0.7 IU/mL (ref 0.30–0.70)
Heparin Unfractionated: 1.03 IU/mL — ABNORMAL HIGH (ref 0.30–0.70)

## 2015-09-17 MED ORDER — MAGNESIUM SULFATE 2 GM/50ML IV SOLN
2.0000 g | Freq: Once | INTRAVENOUS | Status: AC
  Administered 2015-09-17: 2 g via INTRAVENOUS
  Filled 2015-09-17: qty 50

## 2015-09-17 MED ORDER — POTASSIUM CHLORIDE 20 MEQ/15ML (10%) PO SOLN
40.0000 meq | ORAL | Status: AC
  Administered 2015-09-17 (×3): 40 meq via ORAL
  Filled 2015-09-17 (×3): qty 30

## 2015-09-17 MED ORDER — POTASSIUM CHLORIDE 20 MEQ/15ML (10%) PO SOLN: 40.0000 meq | ORAL | Status: DC

## 2015-09-17 MED ORDER — POTASSIUM CHLORIDE 10 MEQ/50ML IV SOLN: 10.0000 meq | INTRAVENOUS | Status: AC

## 2015-09-17 MED ORDER — POTASSIUM CHLORIDE 10 MEQ/50ML IV SOLN
10.0000 meq | INTRAVENOUS | Status: AC
  Administered 2015-09-17 (×4): 10 meq via INTRAVENOUS
  Filled 2015-09-17 (×4): qty 50

## 2015-09-17 MED ORDER — MIDAZOLAM HCL 2 MG/2ML IJ SOLN
2.0000 mg | INTRAMUSCULAR | Status: DC | PRN
  Administered 2015-09-17 – 2015-09-18 (×8): 2 mg via INTRAVENOUS
  Filled 2015-09-17 (×8): qty 2

## 2015-09-17 MED ORDER — POTASSIUM CHLORIDE 20 MEQ/15ML (10%) PO SOLN
ORAL | Status: AC
  Administered 2015-09-17: 40 meq
  Filled 2015-09-17: qty 30

## 2015-09-17 MED ORDER — ATROPINE SULFATE 0.1 MG/ML IJ SOLN
INTRAMUSCULAR | Status: AC
  Filled 2015-09-17: qty 10

## 2015-09-17 MED ORDER — DOPAMINE-DEXTROSE 3.2-5 MG/ML-% IV SOLN
INTRAVENOUS | Status: AC
  Filled 2015-09-17: qty 250

## 2015-09-17 MED ORDER — POTASSIUM CHLORIDE 10 MEQ/50ML IV SOLN
10.0000 meq | INTRAVENOUS | Status: DC
  Administered 2015-09-17: 10 meq via INTRAVENOUS
  Filled 2015-09-17: qty 50

## 2015-09-17 NOTE — Progress Notes (Signed)
Heparin stopped at 1230 per pharmacy. RN called pharmacy at 1330 to see when to restart heparin, orders given for heparin to remain off at this time due to no cardioversion.

## 2015-09-17 NOTE — Progress Notes (Signed)
Dr. Nelda Marseille and myself entered the patients room to inform the patients wife that [facility] did not accept the transfer. She stated "and I should just take your word for that". Dr. Nelda Marseille handed the paper of the name of the doctor and told her she could call back to [facility].

## 2015-09-17 NOTE — Progress Notes (Signed)
ANTICOAGULATION CONSULT NOTE - Follow Up Consult  Pharmacy Consult for Heparin  Indication: atrial fibrillation  No Known Allergies  Patient Measurements: Height: 5\' 9"  (175.3 cm) Weight: 169 lb 8.5 oz (76.9 kg) IBW/kg (Calculated) : 70.7  Vital Signs: Temp: 100.1 F (37.8 C) (01/20 0800) Temp Source: Oral (01/20 0800) BP: 126/74 mmHg (01/20 1200) Pulse Rate: 96 (01/20 1200)  Labs:  Recent Labs  09/15/15 0444  09/16/15 0500 09/16/15 0610  09/16/15 2030 09/17/15 0152 09/17/15 0530 09/17/15 1045  HGB 8.3*  --  5.8* 8.2*  --   --   --  7.8*  --   HCT 28.1*  --  19.6* 28.1*  --   --   --  27.4*  --   PLT 274  --  226  --   --   --   --  337  --   LABPROT 17.5*  --   --   --   --   --   --   --   --   INR 1.43  --   --   --   --   --   --   --   --   HEPARINUNFRC  --   --   --   --   < > 0.90* 0.70  --  1.03*  CREATININE 1.44*  < > 1.31*  --   --   --   --  1.13 1.23  < > = values in this interval not displayed.  Estimated Creatinine Clearance: 56.7 mL/min (by C-G formula based on Cr of 1.23).  Assessment: On heparin for afib s/p hypothermia protocol, TEE guided DCCV was cancelled after very brief episode of asystole trying to get the scope down.  Heparin level is again elevated at 1.0 this morning. Some hematuria noted by nursing. Will hold heparin for now and I have informed the HF team of changes. Hgb was down to 7.8 this am so will check a cbc again later today.  Heparin was restarted d/t possible DCCV which has been cancelled so will stop heparin.  Goal of Therapy:  Heparin level 0.3-0.7 units/ml Monitor platelets by anticoagulation protocol: Yes   Plan:  -stop heparin per MD orders   Erin Hearing PharmD., BCPS Clinical Pharmacist Pager [phone] 09/17/2015 12:24 PM

## 2015-09-17 NOTE — Progress Notes (Signed)
Pt wife at beside around noon 09/17/15 demanding that I call a physician to get her husband transferred to [facility]. She stated "all [facility] needs is for a doctor to call the doctor there" and her husband can be transferred immediately. She also told me "time is of the essence". I informed the patients wife that I would call one of my doctors as soon as I get finished with the care I was giving her husband at that time. She stated that I needed to make that call "now". I exited the patients room and asked my secretary to page Dr. Aundra Dubin and Dr. Nelda Marseille to discuss who was going to call [facility] to discuss the transfer.

## 2015-09-17 NOTE — Progress Notes (Signed)
Addendum:  Spoke with Dr. Solmon Ice (SP) from [place], presented the entire case to him, he stated that there is little that they will differently and that transfer would be of very little value and patient was rejected in transfer.  I asked Dr. Lemar Livings if they have a Dr. Ronny Flurry that reportedly accepted the patient and he states that he has had the pager all day and that here is no Dr. Ronny Flurry.  The only Dr. Ronny Flurry in the [facility] system is an immunologist.  I informed the patient that the patient was rejected by [facility] and gave her the name of the MD and her response was "and I should just take your word for that".  I calmly gave her the paper with the name of the MD and apologized that that is all I can do for her.  Of note, I had offered to trach the patient and when she refused I offered comfort care and she refused that as well.  She wants to take the patient home off the vent which I told her was not a possibility.  The patient is critically ill with multiple organ systems failure and requires high complexity decision making for assessment and support, frequent evaluation and titration of therapies, application of advanced monitoring technologies and extensive interpretation of multiple databases.   Critical Care Time devoted to patient care services described in this note is  45  Minutes. This time reflects time of care of this signee Dr Jennet Maduro. This critical care time does not reflect procedure time, or teaching time or supervisory time of PA/NP/Med student/Med Resident etc but could involve care discussion time.  Rush Farmer, M.D. [facility] Pulmonary/Critical Care Medicine. Pager: [phone]. After hours pager: [phone].

## 2015-09-17 NOTE — Progress Notes (Signed)
PULMONARY / CRITICAL CARE MEDICINE   Name: Patrick Sexton [facility] MRN: [id number] DOB: 1946/05/09    ADMISSION DATE:  09/20/2015  REFERRING MD:  EDP  CHIEF COMPLAINT:  Cardiac arrest    SUBJECTIVE:  Still with mild fever. This AM, TEE probe was inserted into the esophagus, patient had asystole for short period (10 seconds) with ROSC. Externally paced until return of previous rhythm. K 2.5 this AM.    VITAL SIGNS: BP 91/54 mmHg  Pulse 65  Temp(Src) 100.1 F (37.8 C) (Oral)  Resp 17  Ht '5\' 9"'$  (1.753 m)  Wt 169 lb 8.5 oz (76.9 kg)  BMI 25.02 kg/m2  SpO2 100%  HEMODYNAMICS: CVP:  [5 mmHg-9 mmHg] 9 mmHg  VENTILATOR SETTINGS: Vent Mode:  [-] PRVC FiO2 (%):  [40 %] 40 % Set Rate:  [12 bmp] 12 bmp Vt Set:  [550 mL] 550 mL PEEP:  [5 cmH20] 5 cmH20 Pressure Support:  [10 cmH20] 10 cmH20 Plateau Pressure:  [17 cmH20-21 cmH20] 21 cmH20  INTAKE / OUTPUT: I/O last 3 completed shifts: In: 6103.5 [I.V.:2496.5; NG/GT:3157; IV Piggyback:450] Out: 8938 [Urine:5185]  PHYSICAL EXAMINATION: General: Intubated, sedated.  Neuro: RASS -1. Sedated, follows commands. Moves all extremities spontaneously.  HEENT: PERRL, ETT/OGT in place. Moist mucus membranes.  Cardiovascular: Regular rate, irregular rhythm. No murmurs.  Lungs: Air entry equal, coarse breath sounds throughout. No wheezes.  Abdomen:  Soft, non-tender, non-distended. BS + Musculoskeletal: Intact.  Skin: Cool, no rashes.   LABS:  BMET  Recent Labs Lab 09/15/15 2050 09/16/15 0500 09/17/15 0530  NA 145 147* 148*  K 2.6* 3.0* 2.5*  CL 98* 107 112*  CO2 32 31 28  BUN 44* 49* 46*  CREATININE 1.40* 1.31* 1.13  GLUCOSE 376* 231* 154*    Electrolytes  Recent Labs Lab 09/14/15 0511 09/15/15 0444 09/15/15 2050 09/16/15 0500 09/17/15 0530  CALCIUM 7.3* 7.3* 6.5* 7.2* 7.2*  MG 2.2 2.2  --  2.4  --   PHOS 3.8 3.7  --  4.7*  --     CBC  Recent Labs Lab 09/15/15 0444 09/16/15 0500 09/16/15 0610 09/17/15 0530  WBC  18.3* 12.3*  --  16.0*  HGB 8.3* 5.8* 8.2* 7.8*  HCT 28.1* 19.6* 28.1* 27.4*  PLT 274 226  --  337    Coag's  Recent Labs Lab 09/15/15 0444  INR 1.43    ABG  Recent Labs Lab 09/15/15 0344 09/15/15 1153 09/16/15 0337  PHART 7.581* 7.544* 7.518*  PCO2ART 36.3 44.0 36.8  PO2ART 85.5 110.0* 108.0*     Glucose  Recent Labs Lab 09/16/15 0815 09/16/15 1222 09/16/15 1635 09/16/15 2035 09/16/15 2344 09/17/15 0535  GLUCAP 153* 169* 206* 132* 144* 126*    Imaging Dg Chest Port 1 View  09/17/2015  CLINICAL DATA:  Respiratory failure. EXAM: PORTABLE CHEST 1 VIEW COMPARISON:  09/16/2015. FINDINGS: Endotracheal tube, NG tube, left IJ line in stable position. Prior CABG. Stable cardiomegaly. Stable bilateral interstitial prominence and patchy infiltrates again noted. Findings suggest congestive heart failure and/or bilateral pneumonia. No pleural effusion or pneumothorax IMPRESSION: 1. Lines and tubes in stable position. 2. Prior CABG.  Stable cardiomegaly. 3. Persistent bilateral from interstitial prominence and patchy bilateral pulmonary infiltrates, no change. Findings consistent with pulmonary edema and/or bilateral pneumonia . Electronically Signed   By: Marcello Moores  Register   On: 09/17/2015 07:35     STUDIES:  CT head 1/7 >> mild atrophy LHC 1/7 >> no intervention but severe LV dysfunction and severe native and  graft disease EEG 1/9 >> Abnormal with moderate generalized nonspecific continuous slowing of cerebral activity, no epileptic disorder  CULTURES: 1/7 MRSA >> NEG 1/7 Urine Cx >> NEG 1/7 Blood Cx >> NEG  1/16 Blood >> NEG 1/16 Respiratory >> Pan-sensitive Klebsiella 1/16 Urine >> NEG  ANTIBIOTICS: Vancomycin 1/9 >> 1/11 Zosyn 1/9 >> 1/11 Unasyn 1/11 >> 1/15 Vanc 1/17 >> 1/19 Ceftaz 1/17 >> 1/19 Ancef 1/19 >>  SIGNIFICANT EVENTS: 01/07  cardiac arrest 01/08  shock 01/09  rewarm 01/10  follows commands 01/11  dnr established 01/16  fevers overnight   01/20  short period of asystole during TEE  LINES/TUBES: 1/7 ETT >> 1/7 Left IJ >>  1/7 R rad a line >> 1/12  DISCUSSION: 70 y/o with hx severe CAD who was shoveling snow and collapsed, 15 min Vfib arrest. Cath no culprit, medical mgmt. Difficult weaning, frequent PVC's, bradycardia, AF with RVR.   ASSESSMENT / PLAN:  PULMONARY A: VDRF s/p Cardiac Arrest Suspected aspiration vs HCAP ?Pulmonary Edema P:   Check ABG noted, respiratory alkalosis Wean PEEP/FiO2 for sats > 92%. Will expire if extubated, need to speak with wife, patient has no chance of surviving extubation, RN to call. Continue Lasix per CHF team See ID  CARDIOVASCULAR A:  Post cardiac arrest while shoveling snow. CPR 10 min epi x 1 LHC with severe native and bypass disease without clear culprit for infarct Hx CAD s/p CABG  CHF; Co-ox improved to 57 Shock (cardiogenic) - resolved Relative AI Atrial fibrillation w/ RVR Severe CAD - no intervention options, medical management  P:  Cardiology following No further plans for TEE/DCCV at this time.  Continue ASA, Plavix  Continue steroids @ 50 mg q12h; taper off Lasix 80 mg IV bid today per CHF Continue Amiodarone, Digoxin  RENAL A:   AKI Hypokalemia; severe Hypernatremia; improving Metabolic Alkalosis; improving P:   Repeat BMP in PM Supp K; 10 mEq x4 IV + 40 mEq x3 per tube Check Mag this AM Even goals Lasix as above KVO IVF Continue Diamox  GASTROINTESTINAL A:   GI PPx Nutrition P:   PPI Continue TF per protocol  Reglan 5 mg IV q6h  HEMATOLOGIC A:   Anticoagulation per cardiology  Leukocytosis; increased this AM P:  Continue Heparin gtt for now Trend CBC  See ID  INFECTIOUS A:   Klebsiella Pneumonia Continued Fever Leukocytosis P:   Continue Ancef Follow fever curve  ENDOCRINE A:   Hyperglycemia Sick euthyroid P:   SSI Lantus 10 units Q24 Repeat TSH 4-6 weeks  NEUROLOGIC A:   Acute Encephalopathy P:   RASS  goal: 0 Precedex gtt, Fentanyl gtt  Versed prn Daily WUA   Patrick Bence, MD PGY-3, Internal Medicine Pager: [phone]  Attending Note:  70 year old male with non-operable CAD who had a TEE cardioversion this AM resulted in asystole.  The patient is not weaning well at all on exam with diffuse crackles, very tachypneic on and off wean.  I spoke with the wife over the phone.  Previously she has refused tracheostomy placement and just wanted to extubate "when ready"  I believe patient will never be ready.  She met with palliative care and again refused to make decisions.  I informed her in no uncertain terms that prolonged intubation is very dangerous and high risk of bleeding and complications due to the position of the tube.  She expressed understanding.  I informed her that the decision is very time sensitive at this point  and again she asked for more time.  I informed her that we need a decision today but she informed me that she is not sure if she can do that.  Unfortunately at this point, we will continue current management.  She is fully aware of the risks she is taking.  The patient is critically ill with multiple organ systems failure and requires high complexity decision making for assessment and support, frequent evaluation and titration of therapies, application of advanced monitoring technologies and extensive interpretation of multiple databases.   Critical Care Time devoted to patient care services described in this note is  35  Minutes. This time reflects time of care of this signee Dr Jennet Maduro. This critical care time does not reflect procedure time, or teaching time or supervisory time of PA/NP/Med student/Med Resident etc but could involve care discussion time.  Rush Farmer, M.D. [facility] Pulmonary/Critical Care Medicine. Pager: [phone]. After hours pager: [phone].

## 2015-09-17 NOTE — Progress Notes (Signed)
ANTICOAGULATION CONSULT NOTE - Follow Up Consult  Pharmacy Consult for Heparin  Indication: atrial fibrillation  No Known Allergies  Patient Measurements: Height: 5\' 9"  (175.3 cm) Weight: 168 lb 3.4 oz (76.3 kg) IBW/kg (Calculated) : 70.7  Vital Signs: Temp: 100.6 F (38.1 C) (01/20 0000) Temp Source: Oral (01/20 0000) BP: 98/56 mmHg (01/20 0200) Pulse Rate: 63 (01/20 0000)  Labs:  Recent Labs  09/14/15 0511 09/15/15 0444 09/15/15 2050 09/16/15 0500 09/16/15 0610 09/16/15 1545 09/16/15 2030 09/17/15 0152  HGB 8.0* 8.3*  --  5.8* 8.2*  --   --   --   HCT 27.1* 28.1*  --  19.6* 28.1*  --   --   --   PLT 252 274  --  226  --   --   --   --   LABPROT  --  17.5*  --   --   --   --   --   --   INR  --  1.43  --   --   --   --   --   --   HEPARINUNFRC  --   --   --   --   --  0.72* 0.90* 0.70  CREATININE 1.36* 1.44* 1.40* 1.31*  --   --   --   --     Estimated Creatinine Clearance: 53.2 mL/min (by C-G formula based on Cr of 1.31).    Assessment: On heparin for afib s/p hypothermia protocol, TEE guided DCCV this AM, HL is therapeutic x 1 after rate decrease  Goal of Therapy:  Heparin level 0.3-0.7 units/ml Monitor platelets by anticoagulation protocol: Yes   Plan:  -Cont heparin at 1400 units/hr -1000 confirmatory HL   Anthony Tamburo 09/17/2015,2:58 AM

## 2015-09-17 NOTE — Progress Notes (Signed)
Patient's wife demands that the patient get transferred to [facility].  She states that she spoke to a Dr. Ronny Flurry who is a cardiologist in [place].  She claimed that Dr. Aundra Dubin has arranged the transfer.  I spoke with Dr. Aundra Dubin who clearly stated that he never spoke to anybody in [place] or arranged anything.  Wife brought a paper with the transfer center phone number and her home and cell numbers, all which we already have.  I called the transfer center, they informed me that the cardiologist is busy in the ER and will call me back.  While we are awaiting the call back, palliative care and  PA for cardiology went in and spoke to the patient.  She continued to demand transfer and told them that they, as well as myself and the nurse are on top of her list and that she will see [place] all in court, to which they nodded in acknowledgement and left the room.  She stepped outside and was rather irate and demanded transfer again and stated that she does not understand what the hold up is, since we can't help the patient here and Dr. Ronny Flurry said that [place] can we should move the patient immediately.  I told her very calmly that inorder for a transfer to occur I need an accepting physician and a bed available.  She turned around and went in the room.  The patient is critically ill with multiple organ systems failure and requires high complexity decision making for assessment and support, frequent evaluation and titration of therapies, application of advanced monitoring technologies and extensive interpretation of multiple databases.   Critical Care Time devoted to patient care services described in this note is  50  Minutes. This time reflects time of care of this signee Dr Jennet Maduro. This critical care time does not reflect procedure time, or teaching time or supervisory time of PA/NP/Med student/Med Resident etc but could involve care discussion time.  Rush Farmer, M.D. [facility] Pulmonary/Critical Care Medicine. Pager:  [phone]. After hours pager: [phone].

## 2015-09-17 NOTE — Progress Notes (Signed)
Pt placed back on full vent support due to increased WOB 

## 2015-09-17 NOTE — Progress Notes (Signed)
Patient ID: Patrick Sexton [facility], male   DOB: 19-Apr-1946, 70 y.o.   MRN: [id number]  Advanced Heart Failure Consult Note  Referring MD: Dr Elsworth Soho PCP: Carmon Ginsberg, PA Primary Cardiologist: Dr Clayborn Bigness  Subjective:    SIGNIFICANT EVENTS: 01/07 cardiac arrest/intubated/sedated/emergent cath 01/08 shock 01/09 rewarm 01/10 follows commands 01/11 dnr established, [facility] signed off 01/16 fevers overnight, BCx sent. HF team asked to see. Fever spike 103, UA sent.  STUDIES:  CT head 1/7>> mild atrophy LHC 1/7>> no intervention but severe LV dysfunction and severe native and graft disease CXR 1/9>> EEG 1/9>>Abnormal with moderate generalized nonspecific continuous slowing of cerebral activity, no epileptic disorder Echo 09/13/15>> LVEF 30-35%, mildly reduce RV, mild MR, mod TR, PA peak pressure 60 mm Hg  This morning, attempted TEE-guided DCCV.  Patient was sedated with 2 mg IV Versed in addition to his Precedex and Fentanyl.  Upon passing TEE probe into esophagus, he became asystolic and was immediately paced externally.  He also received 1 mg IV atropine.  He recovered pulse promptly with pacing and had a native perfusing rhythm again quickly.  He remains in atrial fibrillation.  Amiodarone gtt initially held but then restarted when HR went > 100 bpm.  BP stable.   Coox 57%.  Tmax 101.9.  K low. He wakes up on vent.   Creatinine trending down slightly 1.44 => 1.40 => 1.31 => 1.13.   Objective:   Weight Range: 169 lb 8.5 oz (76.9 kg) Body mass index is 25.02 kg/(m^2).   Vital Signs:   Temp:  [98 F (36.7 C)-101.9 F (38.8 C)] 100.4 F (38 C) (01/20 0400) Pulse Rate:  [59-92] 65 (01/20 0700) Resp:  [16-31] 17 (01/20 0700) BP: (91-118)/(50-68) 91/54 mmHg (01/20 0700) SpO2:  [100 %] 100 % (01/20 0700) FiO2 (%):  [40 %] 40 % (01/20 0414) Weight:  [169 lb 8.5 oz (76.9 kg)] 169 lb 8.5 oz (76.9 kg) (01/20 0300) Last BM Date: 09/14/15  Weight change: Filed Weights   09/15/15 0437  09/16/15 0400 09/17/15 0300  Weight: 168 lb 14 oz (76.6 kg) 168 lb 3.4 oz (76.3 kg) 169 lb 8.5 oz (76.9 kg)    Intake/Output:   Intake/Output Summary (Last 24 hours) at 09/17/15 0813 Last data filed at 09/17/15 0713  Gross per 24 hour  Intake 3473.16 ml  Output   3785 ml  Net -311.84 ml     Physical Exam: CVP: 8 General: Chronically ill appearing, intubated and sedated. HEENT: Remains on vent.  Neck: supple. JVP appears elevated . Carotids 2+ bilat; no bruits. No thyromegaly or nodule noted. Cor: PMI nondisplaced. Irregularly irregular. Lungs: Mechanical breath sounds.  Abdomen: soft, non-distended, no HSM. No bruits or masses. +BS  Extremities: no cyanosis, clubbing, rash. Trace-1+ LE edema bilaterally.  Neuro: Intubated, sedated on fentanyl currently at decreasing doses. Intermittently follows commands. Asleep and stirs on my exam.   Telemetry: Reviewed personally, Afib 70s with LBBB  Labs: CBC  Recent Labs  09/16/15 0500 09/16/15 0610 09/17/15 0530  WBC 12.3*  --  16.0*  HGB 5.8* 8.2* 7.8*  HCT 19.6* 28.1* 27.4*  MCV 86.0  --  87.5  PLT 226  --  [id number]   Basic Metabolic Panel  Recent Labs  09/15/15 0444  09/16/15 0500 09/17/15 0530  NA 150*  < > 147* 148*  K 2.8*  < > 3.0* 2.5*  CL 103  < > 107 112*  CO2 35*  < > 31 28  GLUCOSE 173*  < >  231* 154*  BUN 46*  < > 49* 46*  CREATININE 1.44*  < > 1.31* 1.13  CALCIUM 7.3*  < > 7.2* 7.2*  MG 2.2  --  2.4  --   PHOS 3.7  --  4.7*  --   < > = values in this interval not displayed. Liver Function Tests No results for input(s): AST, ALT, ALKPHOS, BILITOT, PROT, ALBUMIN in the last 72 hours. No results for input(s): LIPASE, AMYLASE in the last 72 hours. Cardiac Enzymes No results for input(s): CKTOTAL, CKMB, CKMBINDEX, TROPONINI in the last 72 hours.  BNP: BNP (last 3 results)  Recent Labs  09/03/2015 1520 09/07/15 1057  BNP 235.5* 670.0*    ProBNP (last 3 results) No results for input(s): PROBNP in the  last 8760 hours.   D-Dimer No results for input(s): DDIMER in the last 72 hours. Hemoglobin A1C No results for input(s): HGBA1C in the last 72 hours. Fasting Lipid Panel  Recent Labs  09/15/15 1019  TRIG 70   Thyroid Function Tests No results for input(s): TSH, T4TOTAL, T3FREE, THYROIDAB in the last 72 hours.  Invalid input(s): FREET3  Other results:     Imaging/Studies:  Dg Chest Port 1 View  09/17/2015  CLINICAL DATA:  Respiratory failure. EXAM: PORTABLE CHEST 1 VIEW COMPARISON:  09/16/2015. FINDINGS: Endotracheal tube, NG tube, left IJ line in stable position. Prior CABG. Stable cardiomegaly. Stable bilateral interstitial prominence and patchy infiltrates again noted. Findings suggest congestive heart failure and/or bilateral pneumonia. No pleural effusion or pneumothorax IMPRESSION: 1. Lines and tubes in stable position. 2. Prior CABG.  Stable cardiomegaly. 3. Persistent bilateral from interstitial prominence and patchy bilateral pulmonary infiltrates, no change. Findings consistent with pulmonary edema and/or bilateral pneumonia . Electronically Signed   By: Marcello Moores  Register   On: 09/17/2015 07:35   Dg Chest Port 1 View  09/16/2015  CLINICAL DATA:  Hypoxia EXAM: PORTABLE CHEST 1 VIEW COMPARISON:  September 15, 2015 FINDINGS: Endotracheal tube tip is 2.9 cm above the carina. Central catheter tip is in the superior vena cava. Nasogastric tube tip and side port below the diaphragm. No pneumothorax. There is mild generalized interstitial edema, unchanged. There is mild patchy bibasilar airspace consolidation, stable. No new opacity. Heart is prominent with pulmonary vascularity within normal limits, stable. No adenopathy. Patient is status post coronary artery bypass grafting. IMPRESSION: Tube and catheter positions as described without pneumothorax. Interstitial edema with patchy bibasilar airspace opacity, stable. No new opacity. Stable cardiac silhouette. Electronically Signed   By:  Lowella Grip III M.D.   On: 09/16/2015 07:30    Latest Echo  Latest Cath   Medications:     Scheduled Medications: . albuterol  2.5 mg Nebulization BID  . antiseptic oral rinse  7 mL Mouth Rinse 10 times per day  . aspirin  81 mg Oral Daily  . atorvastatin  80 mg Oral q1800  . atropine      .  ceFAZolin (ANCEF) IV  2 g Intravenous Q8H  . chlorhexidine gluconate  15 mL Mouth Rinse BID  . digoxin  0.125 mg Per Tube Daily  . DOPamine      . free water  300 mL Per Tube Q6H  . furosemide  80 mg Intravenous BID  . hydrocortisone sod succinate (SOLU-CORTEF) inj  50 mg Intravenous Q12H  . insulin aspart  2-6 Units Subcutaneous 6 times per day  . insulin glargine  10 Units Subcutaneous Q24H  . magnesium sulfate 1 - 4 g bolus  IVPB  2 g Intravenous Once  . metoCLOPramide (REGLAN) injection  5 mg Intravenous 4 times per day  . pantoprazole sodium  40 mg Per Tube Daily  . polyethylene glycol  17 g Per Tube Daily  . potassium chloride  10 mEq Intravenous Q1 Hr x 4  . potassium chloride  40 mEq Oral Q3H  . potassium chloride      . senna-docusate  1 tablet Per Tube QHS  . sodium chloride  10-40 mL Intracatheter Q12H  . sodium chloride  3 mL Intravenous Q12H  . sodium chloride  3 mL Intravenous Q12H    Infusions: . sodium chloride Stopped (09/13/15 2000)  . sodium chloride    . amiodarone 30 mg/hr (09/16/15 2056)  . dexmedetomidine 1.2 mcg/kg/hr (09/17/15 0735)  . feeding supplement (VITAL AF 1.2 CAL) 1,000 mL (09/17/15 0000)  . fentaNYL infusion INTRAVENOUS 300 mcg/hr (09/17/15 0157)  . heparin 1,400 Units/hr (09/17/15 0157)    PRN Medications: sodium chloride, acetaminophen, fentaNYL, midazolam, ondansetron (ZOFRAN) IV, pneumococcal 23 valent vaccine, sodium chloride, sodium chloride, sodium chloride   Assessment/Plan   Patrick Sexton is a 70 y.o. male with history of CAD s/p CABG x 4 2008, PAF, HTN, Ischemic cardiomyopathy Echo 03/2015 EF 45-50%, and dyslipidemia who  presented to [facility]/05/2015 after cardiac arrest. Emergent cath with severe CAD, EF 25%, and no culprit. Medical management. Remains on vent. HF team asked to see for second opinion regarding treatment of CAD/CHF (i.e. Advanced therapies) and recurrent SVT with attempts to wean vent.  1. Acute hypoxemic respiratory failure: Bilateral airspace disease, suspect aspiration post-arrest. Temp to 101.9 last night, respiratory cultures with Klebsiella.  - I/Os essentially even yesterday with IV Lasix but good UOP.  K is low again and will be repleted aggressively.  Would keep on IV Lasix today to keep I/Os even to negative (stable creatinine, CVP 8).    - Antibiotic coverage with Ancef for Klebsiella PNA.  - Wean vent today with eye towards extubation if able.    2. CAD: Severe native vessel disease with LIMA-LAD as the only functional graft. Based on the appearance of the cath, his arrest may have been scar-mediated VF rather than primary plaque rupture/ACS. No interventional or redo CABG option.  - Continue ASA 81, atorvastatin.  3. Acute on chronic systolic CHF: LV-gram with EF 25%, echo 09/13/15 with EF 30-35%. Co-ox ok today at 57%. Started digoxin. CVP 8. - Continue digoxin - Lasix 80 mg IV bid today with aggressive K repletion (repeat BMET in pm) to keep I/Os even to negative (good CVP now).   4. Atrial fibrillation: Patient is currently in atrial fibrillation with rate improved in the 70s on amiodarone gtt.  - Continue amiodarone and digoxin for rate control. - He is on heparin gtt currently for anticoagulation.  Hemoglobin mildly lower but no overt bleeding. Follow closely.  - Patient went asystolic briefly with passing of TEE probe to esophagus.  Will not be doing TEE-guided DCCV.  Continue to control HR with amiodarone.  5. Hypernatremia: Free water boluses continue, sodium with slow downward trend but still high at 148.  6. Renal: Creatinine stable today.   7. ID: Still febrile  overnight with Klebsiella PNA.  On Ancef.    Discussed the above with patient's wife.   45 minutes critical care time.   Loralie Champagne 09/17/2015 8:13 AM

## 2015-09-17 NOTE — Progress Notes (Signed)
CRITICAL VALUE ALERT  Critical value received:  Potassium 2.5  Date of notification:  09/17/15  Time of notification:  06:40  Critical value read back:Yes.    Nurse who received alert:  Cleotis Nipper, RN  MD notified (1st page):  E-link  Time of first page: 6:45  Responding MD:  E-link  Time MD responded:  6:45

## 2015-09-17 NOTE — Progress Notes (Signed)
Daily Progress Note   Patient Name: Patrick Sexton [facility]       Date: 09/17/2015 DOB: [birth year]  Age: 70 y.o. MRN#: [id number] Attending Physician: Patrick Noel, MD Primary Care Physician: Patrick Ginsberg, PA Admit Date: 09/15/2015  Reason for Consultation/Follow-up: Establishing goals of care and Psychosocial/spiritual support  Subjective:  Continued conversation at the patient's bedside along with wife  to discuss diagnosis, prognosis, GOC, treatment plan  and options.  She is upset today and adamantly requesting her husband be transferred to [facility], she is telling the staff that she spoke to a Patrick Sexton and it has been cleared,  CCM is contacting the transfer center at [facility]. We belive it is unlikely that he will be cleared for transport  All staff have been supporting Patrick Sexton in hopes of helping her understand the very poor prognosis. I continue to express my worry that his prognosis remains poor and that continuing aggressive interventions will not change the outcome.  We discussed the concept of mortality  Created space and opportunity for Patrick Sexton to share her thoughts and feelings and fears regarding current situation.    PMT will continue to support holistically.   Length of Stay: 13 days  Current Medications: Scheduled Meds:  . albuterol  2.5 mg Nebulization BID  . antiseptic oral rinse  7 mL Mouth Rinse 10 times per day  . aspirin  81 mg Oral Daily  . atorvastatin  80 mg Oral q1800  . atropine      .  ceFAZolin (ANCEF) IV  2 g Intravenous Q8H  . chlorhexidine gluconate  15 mL Mouth Rinse BID  . digoxin  0.125 mg Per Tube Daily  . DOPamine      . free water  300 mL Per Tube Q6H  . furosemide  80 mg Intravenous BID  . hydrocortisone sod succinate (SOLU-CORTEF) inj  50 mg Intravenous  Q12H  . insulin aspart  2-6 Units Subcutaneous 6 times per day  . insulin glargine  10 Units Subcutaneous Q24H  . metoCLOPramide (REGLAN) injection  5 mg Intravenous 4 times per day  . pantoprazole sodium  40 mg Per Tube Daily  . polyethylene glycol  17 g Per Tube Daily  . potassium chloride  40 mEq Oral Q3H  . senna-docusate  1 tablet Per Tube QHS  .  sodium chloride  10-40 mL Intracatheter Q12H  . sodium chloride  3 mL Intravenous Q12H  . sodium chloride  3 mL Intravenous Q12H    Continuous Infusions: . sodium chloride Stopped (09/13/15 2000)  . sodium chloride    . amiodarone 30 mg/hr (09/17/15 0927)  . dexmedetomidine 1.1 mcg/kg/hr (09/17/15 0928)  . feeding supplement (VITAL AF 1.2 CAL) 1,000 mL (09/17/15 1220)  . fentaNYL infusion INTRAVENOUS 300 mcg/hr (09/17/15 1229)    PRN Meds: sodium chloride, acetaminophen, fentaNYL, midazolam, ondansetron (ZOFRAN) IV, pneumococcal 23 valent vaccine, sodium chloride, sodium chloride, sodium chloride  Physical Exam: Physical Exam  Constitutional: He appears cachectic. He appears ill. He is intubated.  Cardiovascular: Tachycardia present.   Pulmonary/Chest: He is intubated.  Abdominal: Soft. Normal appearance.  Neurological: He is unresponsive.  -unable to follow simple commands  Skin: Skin is warm and dry.                Vital Signs: BP 84/61 mmHg  Pulse 55  Temp(Src) 97.9 F (36.6 C) (Oral)  Resp 16  Ht 5\' 9"  (1.753 m)  Wt 76.9 kg (169 lb 8.5 oz)  BMI 25.02 kg/m2  SpO2 100% SpO2: SpO2: 100 % O2 Device: O2 Device: Ventilator O2 Flow Rate:    Intake/output summary:   Intake/Output Summary (Last 24 hours) at 09/17/15 1341 Last data filed at 09/17/15 1300  Gross per 24 hour  Intake 4486.28 ml  Output   2810 ml  Net 1676.28 ml   LBM: Last BM Date: 09/17/15 Baseline Weight: Weight: 79.379 kg (175 lb) Most recent weight: Weight: 76.9 kg (169 lb 8.5 oz)       Palliative Assessment/Data: Flowsheet Rows        Most  Recent Value   Intake Tab    Referral Department  Cardiology   Unit at Time of Referral  ICU   Palliative Care Primary Diagnosis  Cardiac   Date Notified  09/13/15   Palliative Care Type  New Palliative care   Reason for referral  Clarify Goals of Care   Date of Admission  09/05/2015   # of days IP prior to Palliative referral  9   Clinical Assessment    Psychosocial & Spiritual Assessment    Palliative Care Outcomes       Additional Data Reviewed: CBC    Component Value Date/Time   WBC 16.0* 09/17/2015 0530   RBC 3.13* 09/17/2015 0530   HGB 7.8* 09/17/2015 0530   HCT 27.4* 09/17/2015 0530   PLT 337 09/17/2015 0530   MCV 87.5 09/17/2015 0530   MCH 24.9* 09/17/2015 0530   MCHC 28.5* 09/17/2015 0530   RDW 17.5* 09/17/2015 0530   LYMPHSABS 0.6* 09/09/2015 0230   MONOABS 0.7 09/09/2015 0230   EOSABS 0.0 09/09/2015 0230   BASOSABS 0.0 09/09/2015 0230    CMP     Component Value Date/Time   NA 151* 09/17/2015 1045   K 3.4* 09/17/2015 1045   CL 111 09/17/2015 1045   CO2 29 09/17/2015 1045   GLUCOSE 194* 09/17/2015 1045   BUN 47* 09/17/2015 1045   CREATININE 1.23 09/17/2015 1045   CALCIUM 7.7* 09/17/2015 1045   PROT 5.0* 09/09/2015 0230   ALBUMIN 2.1* 09/09/2015 0230   AST 61* 09/09/2015 0230   ALT 83* 09/09/2015 0230   ALKPHOS 137* 09/09/2015 0230   BILITOT 1.4* 09/09/2015 0230   GFRNONAA 58* 09/17/2015 1045   GFRAA >60 09/17/2015 1045       Problem List:  Patient  Active Problem List   Diagnosis Date Noted  . Palliative care encounter   . DNR (do not resuscitate) 09/14/2015  . Encounter for orogastric tube placement   . Acute respiratory failure with hypoxemia ([place])   . Encounter for central line placement   . Acute respiratory failure ([place])   . Aspiration pneumonia ([place])   . VF i shocked twice in field 09/10/2015  . PAF- NSR at LOV- now in AF with RVR post arrest 09/16/2015  . Cardiac arrest while shoveling snow 09/25/2015  . LBBB (left bundle branch  block) 09/02/2015  . Elevated troponin   . Lactic acid acidosis   . CCF (congestive cardiac failure) ([place]) 02/10/2015  . Arthritis, degenerative 02/10/2015  . HLD (hyperlipidemia) 02/10/2015  . Essential hypertension 02/10/2015  . AF (paroxysmal atrial fibrillation) ([place]) 02/10/2015  . H/O CABG x 4 2008 02/10/2015     Palliative Care Assessment & Plan    Code Status:  DNR-previously documented     Code Status Orders        Start     Ordered   09/08/15 1325  Do not attempt resuscitation (DNR)   Continuous    Question Answer Comment  In the event of cardiac or respiratory ARREST Do not call a "code blue"   In the event of cardiac or respiratory ARREST Do not perform Intubation, CPR, defibrillation or ACLS   In the event of cardiac or respiratory ARREST Use medication by any route, position, wound care, and other measures to relive pain and suffering. May use oxygen, suction and manual treatment of airway obstruction as needed for comfort.      09/08/15 1324    Code Status History    Date Active Date Inactive Code Status Order ID Comments User Context   09/24/2015  6:11 PM 09/08/2015  1:24 PM Full Code [id number]  Elsie Stain, MD Inpatient   09/11/2015  5:05 PM 09/05/2015  6:11 PM Full Code [id number]  Grace Bushy Minor, NP Inpatient   09/01/2015  4:58 PM 09/27/2015  5:05 PM Full Code [id number]  Grace Bushy Minor, NP Inpatient        Goals of Care/Additional Recommendations:   Desire for further Chaplaincy support:yes  Psycho-social Needs: Grief/Bereavement Support     Palliative Prophylaxis:   Aspiration, Bowel Regimen, Delirium Protocol, Eye Care, Frequent Pain Assessment, Oral Care and Turn Reposition     Discharge Planning:  Pending outcomes   Care plan was discussed with Patrick Nelda Marseille and Carin Hock with Cardiology  Thank you for allowing the Palliative Medicine Team to assist in the care of this patient.   Time In: 1330 Time Out: 1405 Total Time 35 min Prolonged  Time Billed  no         Knox Royalty, NP  09/17/2015, 1:41 PM  Please contact Palliative Medicine Team phone at [phone] for questions and concerns.

## 2015-09-18 ENCOUNTER — Inpatient Hospital Stay (HOSPITAL_COMMUNITY): Payer: Medicare Other

## 2015-09-18 LAB — GLUCOSE, CAPILLARY
GLUCOSE-CAPILLARY: 132 mg/dL — AB (ref 65–99)
GLUCOSE-CAPILLARY: 147 mg/dL — AB (ref 65–99)
GLUCOSE-CAPILLARY: 147 mg/dL — AB (ref 65–99)
GLUCOSE-CAPILLARY: 196 mg/dL — AB (ref 65–99)
Glucose-Capillary: 170 mg/dL — ABNORMAL HIGH (ref 65–99)
Glucose-Capillary: 127 mg/dL — ABNORMAL HIGH (ref 65–99)
Glucose-Capillary: 198 mg/dL — ABNORMAL HIGH (ref 65–99)

## 2015-09-18 LAB — CBC
HCT: 29.3 % — ABNORMAL LOW (ref 39.0–52.0)
HEMOGLOBIN: 8.6 g/dL — AB (ref 13.0–17.0)
MCH: 25.5 pg — AB (ref 26.0–34.0)
MCHC: 29.4 g/dL — AB (ref 30.0–36.0)
MCV: 86.9 fL (ref 78.0–100.0)
Platelets: 382 10*3/uL (ref 150–400)
RBC: 3.37 MIL/uL — AB (ref 4.22–5.81)
RDW: 18 % — ABNORMAL HIGH (ref 11.5–15.5)
WBC: 21 10*3/uL — ABNORMAL HIGH (ref 4.0–10.5)

## 2015-09-18 LAB — POCT I-STAT 3, ART BLOOD GAS (G3+)
ACID-BASE EXCESS: 1 mmol/L (ref 0.0–2.0)
ACID-BASE EXCESS: 3 mmol/L — AB (ref 0.0–2.0)
Bicarbonate: 26.6 mEq/L — ABNORMAL HIGH (ref 20.0–24.0)
Bicarbonate: 26.7 mEq/L — ABNORMAL HIGH (ref 20.0–24.0)
O2 SAT: 98 %
O2 SAT: 99 %
PH ART: 7.346 — AB (ref 7.350–7.450)
PH ART: 7.488 — AB (ref 7.350–7.450)
PO2 ART: 106 mmHg — AB (ref 80.0–100.0)
PO2 ART: 110 mmHg — AB (ref 80.0–100.0)
Patient temperature: 98.6
Patient temperature: 98.6
TCO2: 28 mmol/L (ref 0–100)
TCO2: 28 mmol/L (ref 0–100)
pCO2 arterial: 48.6 mmHg — ABNORMAL HIGH (ref 35.0–45.0)
pCO2 arterial: 35.2 mmHg (ref 35.0–45.0)

## 2015-09-18 LAB — BASIC METABOLIC PANEL
ANION GAP: 10 (ref 5–15)
BUN: 55 mg/dL — ABNORMAL HIGH (ref 6–20)
CHLORIDE: 115 mmol/L — AB (ref 101–111)
CO2: 28 mmol/L (ref 22–32)
CREATININE: 1.2 mg/dL (ref 0.61–1.24)
Calcium: 7.6 mg/dL — ABNORMAL LOW (ref 8.9–10.3)
GFR calc non Af Amer: 60 mL/min — ABNORMAL LOW (ref >60)
GLUCOSE: 172 mg/dL — AB (ref 65–99)
Potassium: 3.7 mmol/L (ref 3.5–5.1)
Sodium: 153 mmol/L — ABNORMAL HIGH (ref 135–145)

## 2015-09-18 LAB — CULTURE, BLOOD (ROUTINE X 2)
CULTURE: NO GROWTH
CULTURE: NO GROWTH

## 2015-09-18 LAB — TRIGLYCERIDES
Triglycerides: 124 mg/dL (ref <150)
Triglycerides: 160 mg/dL — ABNORMAL HIGH (ref <150)

## 2015-09-18 LAB — CARBOXYHEMOGLOBIN
CARBOXYHEMOGLOBIN: 1.3 % (ref 0.5–1.5)
Methemoglobin: 0.7 % (ref 0.0–1.5)
O2 SAT: 51.2 %
Total hemoglobin: 12.2 g/dL — ABNORMAL LOW (ref 13.5–18.0)

## 2015-09-18 LAB — DIGOXIN LEVEL: Digoxin Level: 0.3 ng/mL — ABNORMAL LOW (ref 0.8–2.0)

## 2015-09-18 LAB — MAGNESIUM: MAGNESIUM: 2.8 mg/dL — AB (ref 1.7–2.4)

## 2015-09-18 MED ORDER — PROPOFOL 1000 MG/100ML IV EMUL
INTRAVENOUS | Status: AC
  Administered 2015-09-18: 5 ug/kg/min via INTRAVENOUS
  Filled 2015-09-18: qty 100

## 2015-09-18 MED ORDER — ALBUTEROL SULFATE (2.5 MG/3ML) 0.083% IN NEBU
2.5000 mg | INHALATION_SOLUTION | RESPIRATORY_TRACT | Status: DC | PRN
  Administered 2015-09-18: 2.5 mg via RESPIRATORY_TRACT
  Filled 2015-09-18: qty 3

## 2015-09-18 MED ORDER — NOREPINEPHRINE BITARTRATE 1 MG/ML IV SOLN
0.0000 ug/min | INTRAVENOUS | Status: DC
  Administered 2015-09-18: 5 ug/min via INTRAVENOUS
  Filled 2015-09-18: qty 4

## 2015-09-18 MED ORDER — DEXTROSE 5 % IV SOLN
INTRAVENOUS | Status: DC
  Administered 2015-09-18: 1000 mL via INTRAVENOUS

## 2015-09-18 MED ORDER — PROPOFOL 1000 MG/100ML IV EMUL
5.0000 ug/kg/min | INTRAVENOUS | Status: DC
  Administered 2015-09-18 (×2): 80 ug/kg/min via INTRAVENOUS
  Administered 2015-09-18: 5 ug/kg/min via INTRAVENOUS
  Administered 2015-09-19 (×4): 80 ug/kg/min via INTRAVENOUS
  Filled 2015-09-18 (×7): qty 100

## 2015-09-18 MED ORDER — ALBUTEROL SULFATE (2.5 MG/3ML) 0.083% IN NEBU
2.5000 mg | INHALATION_SOLUTION | RESPIRATORY_TRACT | Status: DC
  Administered 2015-09-18 – 2015-09-21 (×18): 2.5 mg via RESPIRATORY_TRACT
  Filled 2015-09-18 (×18): qty 3

## 2015-09-18 MED ORDER — METHYLPREDNISOLONE SODIUM SUCC 125 MG IJ SOLR
60.0000 mg | Freq: Four times a day (QID) | INTRAMUSCULAR | Status: DC
  Administered 2015-09-18 – 2015-09-20 (×9): 60 mg via INTRAVENOUS
  Filled 2015-09-18 (×9): qty 2

## 2015-09-18 MED ORDER — ALBUTEROL SULFATE (2.5 MG/3ML) 0.083% IN NEBU
INHALATION_SOLUTION | RESPIRATORY_TRACT | Status: AC
  Filled 2015-09-18: qty 3

## 2015-09-18 MED ORDER — FREE WATER
300.0000 mL | Status: DC
  Administered 2015-09-18 – 2015-09-19 (×7): 300 mL

## 2015-09-18 MED ORDER — POTASSIUM CHLORIDE 20 MEQ/15ML (10%) PO SOLN
40.0000 meq | Freq: Two times a day (BID) | ORAL | Status: DC
  Administered 2015-09-18 – 2015-09-19 (×4): 40 meq
  Filled 2015-09-18 (×4): qty 30

## 2015-09-18 MED ORDER — NOREPINEPHRINE BITARTRATE 1 MG/ML IV SOLN
0.0000 ug/min | INTRAVENOUS | Status: DC
  Administered 2015-09-18: 10 ug/min via INTRAVENOUS
  Filled 2015-09-18: qty 16

## 2015-09-18 NOTE — Progress Notes (Addendum)
Spoke w/ [facility] Covering MD Dr Solmon Ice . Updated re: pt's case in full. They do not have anything to add that we are not already doing.   Erick Colace ACNP-BC [place] Pager # [phone] OR # [phone] if no answer

## 2015-09-18 NOTE — Progress Notes (Addendum)
Patient ID: Patrick Sexton [facility], male   DOB: 09/29/1945, 70 y.o.   MRN: [id number]  Advanced Heart Failure Consult Note  Referring MD: Dr Elsworth Soho PCP: Carmon Ginsberg, PA Primary Cardiologist: Dr Clayborn Bigness  Subjective:    SIGNIFICANT EVENTS: 01/07 cardiac arrest/intubated/sedated/emergent cath 01/08 shock 01/09 rewarm 01/10 follows commands 01/11 dnr established, [facility] signed off 01/16 fevers overnight, BCx sent. HF team asked to see. Fever spike 103, UA sent. 01/20  attempted TEE-guided DCCV.  Patient was sedated with 2 mg IV Versed in addition to his Precedex and Fentanyl.  Upon passing TEE probe into esophagus, he became asystolic and was immediately paced externally.  He also received 1 mg IV atropine.  He recovered pulse promptly with pacing and had a native perfusing rhythm again quickly.  He remained in atrial fibrillation.  Amiodarone gtt initially held but then restarted when HR went > 100 bpm.  BP stable.   STUDIES:  CT head 1/7>> mild atrophy LHC 1/7>> no intervention but severe LV dysfunction and severe native and graft disease CXR 1/9>> EEG 1/9>>Abnormal with moderate generalized nonspecific continuous slowing of cerebral activity, no epileptic disorder Echo 09/13/15>> LVEF 30-35%, mildly reduce RV, mild MR, mod TR, PA peak pressure 60 mm Hg  Attemped vent wean yesterday but tachypneic and tachycardic.   Co-ox a bit lower today at 51%, repeat.  Afebrile overnight.  Requiring a lot of sedation.  I/Os positive but weight stable and CVP 8.   Creatinine stable, BUN up a bit.   Objective:   Weight Range: 168 lb 6.9 oz (76.4 kg) Body mass index is 24.86 kg/(m^2).   Vital Signs:   Temp:  [97.9 F (36.6 C)-100.1 F (37.8 C)] 98.8 F (37.1 C) (01/21 0702) Pulse Rate:  [33-120] 84 (01/21 0702) Resp:  [16-39] 25 (01/21 0702) BP: (80-159)/(50-110) 80/52 mmHg (01/21 0702) SpO2:  [93 %-100 %] 100 % (01/21 0702) FiO2 (%):  [40 %] 40 % (01/21 0702) Weight:  [168 lb 6.9 oz (76.4 kg)]  168 lb 6.9 oz (76.4 kg) (01/21 0400) Last BM Date: 09/17/15  Weight change: Filed Weights   09/16/15 0400 09/17/15 0300 09/18/15 0400  Weight: 168 lb 3.4 oz (76.3 kg) 169 lb 8.5 oz (76.9 kg) 168 lb 6.9 oz (76.4 kg)    Intake/Output:   Intake/Output Summary (Last 24 hours) at 09/18/15 0737 Last data filed at 09/18/15 [id number]  Gross per 24 hour  Intake 4636.36 ml  Output   2650 ml  Net 1986.36 ml     Physical Exam: CVP: 8 General: Chronically ill appearing, intubated and sedated. HEENT: Remains on vent.  Neck: supple. JVP appears elevated . Carotids 2+ bilat; no bruits. No thyromegaly or nodule noted. Cor: PMI nondisplaced. Irregularly irregular. Lungs: Mechanical breath sounds.  Abdomen: soft, non-distended, no HSM. No bruits or masses. +BS  Extremities: no cyanosis, clubbing, rash. Trace-1+ LE edema bilaterally.  Neuro: Intubated, sedated   Telemetry: Reviewed personally, Afib 80s with LBBB  Labs: CBC  Recent Labs  09/17/15 2240 09/18/15 0359  WBC 18.6* 21.0*  HGB 8.3* 8.6*  HCT 27.9* 29.3*  MCV 85.8 86.9  PLT 343 [id number]   Basic Metabolic Panel  Recent Labs  09/16/15 0500  09/17/15 2240 09/18/15 0359  NA 147*  < > 150* 153*  K 3.0*  < > 4.4 3.7  CL 107  < > 114* 115*  CO2 31  < > 28 28  GLUCOSE 231*  < > 178* 172*  BUN 49*  < > 50*  55*  CREATININE 1.31*  < > 1.28* 1.20  CALCIUM 7.2*  < > 7.5* 7.6*  MG 2.4  < > 2.7* 2.8*  PHOS 4.7*  --   --   --   < > = values in this interval not displayed. Liver Function Tests No results for input(s): AST, ALT, ALKPHOS, BILITOT, PROT, ALBUMIN in the last 72 hours. No results for input(s): LIPASE, AMYLASE in the last 72 hours. Cardiac Enzymes No results for input(s): CKTOTAL, CKMB, CKMBINDEX, TROPONINI in the last 72 hours.  BNP: BNP (last 3 results)  Recent Labs  09/20/2015 1520 09/07/15 1057  BNP 235.5* 670.0*    ProBNP (last 3 results) No results for input(s): PROBNP in the last 8760 hours.   D-Dimer No  results for input(s): DDIMER in the last 72 hours. Hemoglobin A1C No results for input(s): HGBA1C in the last 72 hours. Fasting Lipid Panel  Recent Labs  09/15/15 1019  TRIG 70   Thyroid Function Tests No results for input(s): TSH, T4TOTAL, T3FREE, THYROIDAB in the last 72 hours.  Invalid input(s): FREET3  Other results:     Imaging/Studies:  Dg Chest Port 1 View  09/17/2015  CLINICAL DATA:  Respiratory failure. EXAM: PORTABLE CHEST 1 VIEW COMPARISON:  09/16/2015. FINDINGS: Endotracheal tube, NG tube, left IJ line in stable position. Prior CABG. Stable cardiomegaly. Stable bilateral interstitial prominence and patchy infiltrates again noted. Findings suggest congestive heart failure and/or bilateral pneumonia. No pleural effusion or pneumothorax IMPRESSION: 1. Lines and tubes in stable position. 2. Prior CABG.  Stable cardiomegaly. 3. Persistent bilateral from interstitial prominence and patchy bilateral pulmonary infiltrates, no change. Findings consistent with pulmonary edema and/or bilateral pneumonia . Electronically Signed   By: Marcello Moores  Register   On: 09/17/2015 07:35    Latest Echo  Latest Cath   Medications:     Scheduled Medications: . albuterol  2.5 mg Nebulization BID  . antiseptic oral rinse  7 mL Mouth Rinse 10 times per day  . aspirin  81 mg Oral Daily  . atorvastatin  80 mg Oral q1800  .  ceFAZolin (ANCEF) IV  2 g Intravenous Q8H  . chlorhexidine gluconate  15 mL Mouth Rinse BID  . digoxin  0.125 mg Per Tube Daily  . free water  300 mL Per Tube Q6H  . furosemide  80 mg Intravenous BID  . hydrocortisone sod succinate (SOLU-CORTEF) inj  50 mg Intravenous Q12H  . insulin aspart  2-6 Units Subcutaneous 6 times per day  . insulin glargine  10 Units Subcutaneous Q24H  . metoCLOPramide (REGLAN) injection  5 mg Intravenous 4 times per day  . pantoprazole sodium  40 mg Per Tube Daily  . polyethylene glycol  17 g Per Tube Daily  . potassium chloride  40 mEq Per  Tube BID  . senna-docusate  1 tablet Per Tube QHS  . sodium chloride  10-40 mL Intracatheter Q12H  . sodium chloride  3 mL Intravenous Q12H  . sodium chloride  3 mL Intravenous Q12H    Infusions: . sodium chloride Stopped (09/13/15 2000)  . sodium chloride    . amiodarone 30 mg/hr (09/17/15 1941)  . dexmedetomidine 1.2 mcg/kg/hr (09/18/15 0323)  . feeding supplement (VITAL AF 1.2 CAL) Stopped (09/18/15 0440)  . fentaNYL infusion INTRAVENOUS 400 mcg/hr (09/18/15 0151)    PRN Medications: sodium chloride, acetaminophen, albuterol, fentaNYL, midazolam, ondansetron (ZOFRAN) IV, pneumococcal 23 valent vaccine, sodium chloride, sodium chloride, sodium chloride   Assessment/Plan   Sandford Craze is  a 70 y.o. male with history of CAD s/p CABG x 4 2008, PAF, HTN, Ischemic cardiomyopathy Echo 03/2015 EF 45-50%, and dyslipidemia who presented to [facility] 08/30/2015 after cardiac arrest. Emergent cath with severe CAD, EF 25%, and no culprit. Medical management. Remains on vent. HF team asked to see for second opinion regarding treatment of CAD/CHF (i.e. Advanced therapies) and recurrent SVT with attempts to wean vent.  1. Acute hypoxemic respiratory failure: Bilateral airspace disease, suspect aspiration post-arrest. Afebrile last night, respiratory cultures with Klebsiella.  - I/Os positive yesterday but primarily due to tube feeds.  CVP stable at 8. Continue current IV Lasix, watch renal indices. - Antibiotic coverage with Ancef for Klebsiella PNA.  - Concerned that he will continue to be difficult to wean from vent.    2. CAD: Severe native vessel disease with LIMA-LAD as the only functional graft. Based on the appearance of the cath, his arrest may have been scar-mediated VF rather than primary plaque rupture/ACS. No interventional or redo CABG option.  - Continue ASA 81, atorvastatin.  3. Acute on chronic systolic CHF: LV-gram with EF 25%, echo 09/13/15 with EF 30-35%. Co-ox lower today at 51%,  will repeat.  Would like to avoid inotrope given some difficulty with tachycardia with atrial fibrillation. Started digoxin, level ok today. CVP 8. - Continue digoxin - Lasix 80 mg IV bid today with aggressive K repletion (K normal this morning).  4. Atrial fibrillation: Patient is currently in atrial fibrillation with rate improved in the 70s on amiodarone gtt.  - Continue amiodarone and digoxin for rate control. - Now off heparin gtt with hematuria again yesterday.   - Patient went asystolic briefly with passing of TEE probe to esophagus.  Will not be doing TEE-guided DCCV.  Continue to control HR with amiodarone.  5. Hypernatremia: Sodium higher today.  Free water boluses continue. 6. Renal: Creatinine stable today with some rise in BUN.   7. ID: Afebrile with Klebsiella PNA.  On Ancef.  Noted rise in WBCs.   Family not here yet this morning.    Loralie Champagne 09/18/2015 7:37 AM  Hypotensive, with SBP persistently down to 70s later in morning.  Will start norepinephrine.  Amiodarone held with low BP, HR rising.  After stable on norepinephrine would restart amiodarone gtt.   Loralie Champagne 09/18/2015

## 2015-09-18 NOTE — Progress Notes (Signed)
RN noted tube feed in pts mouth. Tube feed was held. Pts mouth thoroughly suctioned and mouth care preformed. Dr Deterding notified. Order received to continue to hold feeds. Will continue to monitor.

## 2015-09-18 NOTE — Progress Notes (Signed)
PULMONARY / CRITICAL CARE MEDICINE   Name: Patrick Sexton [facility] MRN: [id number] DOB: 07/16/1946    ADMISSION DATE:  09/18/2015  REFERRING MD:  EDP  CHIEF COMPLAINT:  Cardiac arrest    SUBJECTIVE:  Still with mild fever. This AM, TEE probe was inserted into the esophagus, patient had asystole for short period (10 seconds) with ROSC. Externally paced until return of previous rhythm. K 2.5 this AM.    VITAL SIGNS: BP 80/52 mmHg  Pulse 94  Temp(Src) 98.8 F (37.1 C) (Oral)  Resp 26  Ht 5\' 9"  (1.753 m)  Wt 168 lb 6.9 oz (76.4 kg)  BMI 24.86 kg/m2  SpO2 100%  HEMODYNAMICS: CVP:  [7 mmHg-12 mmHg] 12 mmHg  VENTILATOR SETTINGS: Vent Mode:  [-] PRVC FiO2 (%):  [40 %] 40 % Set Rate:  [12 bmp] 12 bmp Vt Set:  [550 mL] 550 mL PEEP:  [5 cmH20] 5 cmH20 Plateau Pressure:  [20 T3112478 cmH20] 48 cmH20  INTAKE / OUTPUT: I/O last 3 completed shifts: In: 6564.2 [I.V.:2704.2; NG/GT:3360; IV Piggyback:500] Out: [id number] [Urine:4225]  PHYSICAL EXAMINATION: General: Intubated, sedated. Marked respiratory asynchrony  Neuro: RASS -3. Sedated, follows commands. Moves all extremities spontaneously.  HEENT: PERRL, ETT/OGT in place. Moist mucus membranes.  Cardiovascular: Regular rate, irregular rhythm. No murmurs.  Lungs: Air entry equal w/ marked accessory muscle use, coarse breath sounds throughout w/ exp wheeze.  Abdomen:  Soft, non-tender, non-distended. BS + Musculoskeletal: Intact.  Skin: Cool, no rashes.   LABS:  BMET  Recent Labs Lab 09/17/15 1600 09/17/15 2240 09/18/15 0359  NA 148* 150* 153*  K 3.4* 4.4 3.7  CL 107 114* 115*  CO2 29 28 28   BUN 48* 50* 55*  CREATININE 1.37* 1.28* 1.20  GLUCOSE 247* 178* 172*    Electrolytes  Recent Labs Lab 09/14/15 0511 09/15/15 0444  09/16/15 0500  09/17/15 1045 09/17/15 1600 09/17/15 2240 09/18/15 0359  CALCIUM 7.3* 7.3*  < > 7.2*  < > 7.7* 7.4* 7.5* 7.6*  MG 2.2 2.2  --  2.4  --  3.2*  --  2.7* 2.8*  PHOS 3.8 3.7  --  4.7*  --    --   --   --   --   < > = values in this interval not displayed.  CBC  Recent Labs Lab 09/17/15 0530 09/17/15 2240 09/18/15 0359  WBC 16.0* 18.6* 21.0*  HGB 7.8* 8.3* 8.6*  HCT 27.4* 27.9* 29.3*  PLT 337 343 382    Coag's  Recent Labs Lab 09/15/15 0444  INR 1.43    ABG  Recent Labs Lab 09/16/15 0337 09/17/15 0952 09/18/15 0837  PHART 7.518* 7.476* 7.346*  PCO2ART 36.8 37.9 48.6*  PO2ART 108.0* 117.0* 106.0*     Glucose  Recent Labs Lab 09/17/15 1227 09/17/15 1539 09/17/15 1948 09/17/15 2331 09/18/15 0314 09/18/15 0730  GLUCAP 148* 121* 139* 170* 147* 127*    Imaging No results found.   STUDIES:  CT head 1/7 >> mild atrophy LHC 1/7 >> no intervention but severe LV dysfunction and severe native and graft disease EEG 1/9 >> Abnormal with moderate generalized nonspecific continuous slowing of cerebral activity, no epileptic disorder  CULTURES: 1/7 MRSA >> NEG 1/7 Urine Cx >> NEG 1/7 Blood Cx >> NEG  1/16 Blood >> NEG 1/16 Respiratory >> Pan-sensitive Klebsiella 1/16 Urine >> NEG  ANTIBIOTICS: Vancomycin 1/9 >> 1/11 Zosyn 1/9 >> 1/11 Unasyn 1/11 >> 1/15 Vanc 1/17 >> 1/19 Ceftaz 1/17 >> 1/19 Ancef 1/19 >>  SIGNIFICANT EVENTS: 01/07  cardiac arrest 01/08  shock 01/09  rewarm 01/10  follows commands 01/11  dnr established 01/16  fevers overnight  01/20  short period of asystole during TEE 1/21 marked vent asynchrony. Shock again-->likely d/t poor vent interaction  LINES/TUBES: 1/7 ETT >> 1/7 Left IJ >>  1/7 R rad a line >> 1/12  DISCUSSION: 70 y/o with hx severe CAD who was shoveling snow and collapsed, 15 min Vfib arrest. Cath no culprit, medical mgmt. Difficult weaning, frequent PVC's, bradycardia, AF with RVR. His course has been  complicated by on-going arrhythmias, cardiogenic shock, worsening renal fxn, klebsiella PNA and not tolerating wean. Has element of bronchospasm and air-hunger no doubt complicating his hemodynamics.    ASSESSMENT / PLAN:  PULMONARY A: VDRF s/p Cardiac Arrest Suspected aspiration vs HCAP ?Pulmonary Edema vs ALI  Bronchospasm w/ possible AECOPD P:   Cont scheduled BDs Add systemic steroids Will try NAVA ventilation to see if this helps w/ vent compliance and sedation needs. If unable will convert to heavier sedation goal and higher Ve goals to meet demands  Cont PAD protocol  See ID  CARDIOVASCULAR A:  Post cardiac arrest while shoveling snow. CPR 10 min epi x 1 LHC with severe native and bypass disease without clear culprit for infarct Hx CAD s/p CABG  CHF; Co-ox improved to 57 Shock (cardiogenic) - intermittent-->may be complicated by vent mechanics  Relative AI Atrial fibrillation w/ RVR Severe CAD - no intervention options, medical management  P:  Cardiology following No further plans for TEE/DCCV at this time.  Continue ASA, Plavix  Hold lasix  Continue Amiodarone, Digoxin  RENAL A:   AKI Hypernatremia and hyperchloremia  Metabolic Alkalosis; improving P:   Hold lasix/diuretics  Free water replacement  Even to positive volume goals   GASTROINTESTINAL A:   GI PPx Nutrition P:   PPI Continue TF per protocol  Reglan 5 mg IV q6h  HEMATOLOGIC A:   Anticoagulation per cardiology  Leukocytosis; increased this AM P:  Heparin on hold d/t hgb drift. Will need to re-evaluate this  Trend CBC  See ID  INFECTIOUS A:   Klebsiella Pneumonia Continued Fever Leukocytosis P:   Continue Ancef Follow fever curve  ENDOCRINE A:   Hyperglycemia Sick euthyroid P:   SSI Lantus 10 units Q24 Repeat TSH 4-6 weeks  NEUROLOGIC A:   Acute Encephalopathy P:   RASS goal: 0 Precedex gtt, Fentanyl gtt  Versed prn Daily [place] ACNP-BC [place] Pager # [phone] OR # [phone] if no answer  Attending Note:

## 2015-09-19 ENCOUNTER — Inpatient Hospital Stay (HOSPITAL_COMMUNITY): Payer: Medicare Other

## 2015-09-19 DIAGNOSIS — R6521 Severe sepsis with septic shock: Secondary | ICD-10-CM

## 2015-09-19 DIAGNOSIS — A419 Sepsis, unspecified organism: Secondary | ICD-10-CM

## 2015-09-19 LAB — GLUCOSE, CAPILLARY
GLUCOSE-CAPILLARY: 216 mg/dL — AB (ref 65–99)
GLUCOSE-CAPILLARY: 171 mg/dL — AB (ref 65–99)
Glucose-Capillary: 208 mg/dL — ABNORMAL HIGH (ref 65–99)
Glucose-Capillary: 207 mg/dL — ABNORMAL HIGH (ref 65–99)
Glucose-Capillary: 181 mg/dL — ABNORMAL HIGH (ref 65–99)
Glucose-Capillary: 183 mg/dL — ABNORMAL HIGH (ref 65–99)

## 2015-09-19 LAB — BLOOD GAS, ARTERIAL
ACID-BASE EXCESS: 1.2 mmol/L (ref 0.0–2.0)
Bicarbonate: 24.7 mEq/L — ABNORMAL HIGH (ref 20.0–24.0)
Drawn by: 245131
FIO2: 40
LHR: 30 {breaths}/min
O2 SAT: 99 %
PATIENT TEMPERATURE: 97.8
PCO2 ART: 34.9 mmHg — AB (ref 35.0–45.0)
PEEP: 5 cmH2O
PH ART: 7.462 — AB (ref 7.350–7.450)
PO2 ART: 131 mmHg — AB (ref 80.0–100.0)
TCO2: 25.8 mmol/L (ref 0–100)
VT: 450 mL

## 2015-09-19 LAB — CBC
HCT: 28.5 % — ABNORMAL LOW (ref 39.0–52.0)
HEMOGLOBIN: 8.3 g/dL — AB (ref 13.0–17.0)
MCH: 25.6 pg — AB (ref 26.0–34.0)
MCHC: 29.1 g/dL — ABNORMAL LOW (ref 30.0–36.0)
MCV: 88 fL (ref 78.0–100.0)
PLATELETS: 505 10*3/uL — AB (ref 150–400)
RBC: 3.24 MIL/uL — AB (ref 4.22–5.81)
RDW: 18.7 % — ABNORMAL HIGH (ref 11.5–15.5)
WBC: 25.5 10*3/uL — AB (ref 4.0–10.5)

## 2015-09-19 LAB — COMPREHENSIVE METABOLIC PANEL
ALBUMIN: 2.5 g/dL — AB (ref 3.5–5.0)
ALK PHOS: 66 U/L (ref 38–126)
ALT: 30 U/L (ref 17–63)
ANION GAP: 10 (ref 5–15)
AST: 26 U/L (ref 15–41)
BILIRUBIN TOTAL: 1 mg/dL (ref 0.3–1.2)
BUN: 56 mg/dL — ABNORMAL HIGH (ref 6–20)
CALCIUM: 7.9 mg/dL — AB (ref 8.9–10.3)
CO2: 27 mmol/L (ref 22–32)
Chloride: 113 mmol/L — ABNORMAL HIGH (ref 101–111)
Creatinine, Ser: 1.15 mg/dL (ref 0.61–1.24)
GLUCOSE: 252 mg/dL — AB (ref 65–99)
POTASSIUM: 4.1 mmol/L (ref 3.5–5.1)
Sodium: 150 mmol/L — ABNORMAL HIGH (ref 135–145)
TOTAL PROTEIN: 5.6 g/dL — AB (ref 6.5–8.1)

## 2015-09-19 LAB — CARBOXYHEMOGLOBIN
CARBOXYHEMOGLOBIN: 2 % — AB (ref 0.5–1.5)
METHEMOGLOBIN: 0.8 % (ref 0.0–1.5)
O2 SAT: 82.5 %
Total hemoglobin: 8.2 g/dL — ABNORMAL LOW (ref 13.5–18.0)

## 2015-09-19 LAB — TRIGLYCERIDES: Triglycerides: 259 mg/dL — ABNORMAL HIGH (ref <150)

## 2015-09-19 MED ORDER — INSULIN GLARGINE 100 UNIT/ML ~~LOC~~ SOLN
10.0000 [IU] | SUBCUTANEOUS | Status: DC
  Administered 2015-09-19 – 2015-09-20 (×2): 10 [IU] via SUBCUTANEOUS
  Filled 2015-09-19 (×3): qty 0.1

## 2015-09-19 MED ORDER — INSULIN GLARGINE 100 UNIT/ML ~~LOC~~ SOLN: 10.0000 [IU] | SUBCUTANEOUS | Status: DC

## 2015-09-19 MED ORDER — MIDAZOLAM HCL 2 MG/2ML IJ SOLN: 1.0000 mg | INTRAMUSCULAR | Status: DC | PRN

## 2015-09-19 MED ORDER — FUROSEMIDE 10 MG/ML IJ SOLN
80.0000 mg | Freq: Two times a day (BID) | INTRAMUSCULAR | Status: DC
  Administered 2015-09-19: 80 mg via INTRAVENOUS
  Filled 2015-09-19: qty 8

## 2015-09-19 MED ORDER — FREE WATER
300.0000 mL | Freq: Three times a day (TID) | Status: DC
  Administered 2015-09-19 – 2015-09-20 (×3): 300 mL

## 2015-09-19 MED ORDER — MIDAZOLAM BOLUS VIA INFUSION
1.0000 mg | INTRAVENOUS | Status: DC | PRN
  Filled 2015-09-19: qty 2

## 2015-09-19 MED ORDER — FUROSEMIDE 10 MG/ML IJ SOLN
80.0000 mg | Freq: Three times a day (TID) | INTRAMUSCULAR | Status: DC
  Administered 2015-09-19 (×2): 80 mg via INTRAVENOUS
  Filled 2015-09-19 (×2): qty 8

## 2015-09-19 MED ORDER — SODIUM CHLORIDE 0.9 % IV SOLN
0.0000 mg/h | INTRAVENOUS | Status: DC
  Administered 2015-09-19: 2 mg/h via INTRAVENOUS
  Administered 2015-09-19: 5 mg/h via INTRAVENOUS
  Administered 2015-09-20: 2.5 mg/h via INTRAVENOUS
  Administered 2015-09-20 – 2015-09-21 (×2): 4 mg/h via INTRAVENOUS
  Filled 2015-09-19 (×6): qty 10

## 2015-09-19 NOTE — Progress Notes (Signed)
Pharmacy Antibiotic Follow-up Note  Zahin Ohanlon [facility] is a 70 y.o. year-old male admitted on 09/14/2015.  The patient is currently on day 6 of antibiotics for pneumonia. The sputum culture grew Klebsiella Pneumonia resistant to only ampicillin.  Assessment/Plan: Vancomycin and ceftazidime was narrowed to current regimen of cefazolin. The recommended duration of therapy should be 7 days. WBC remains elevated, however pt has also been on long course of high dose steroids. We will follow up with stopping antibiotics within the next few days with clinical improvement.   Temp (24hrs), Avg:98.9 F (37.2 C), Min:97.8 F (36.6 C), Max:102.9 F (39.4 C)   Recent Labs Lab 09/16/15 0500 09/17/15 0530 09/17/15 2240 09/18/15 0359 09/19/15 0400  WBC 12.3* 16.0* 18.6* 21.0* 25.5*     Recent Labs Lab 09/17/15 1045 09/17/15 1600 09/17/15 2240 09/18/15 0359 09/19/15 0400  CREATININE 1.23 1.37* 1.28* 1.20 1.15   Estimated Creatinine Clearance: 60.6 mL/min (by C-G formula based on Cr of 1.15).    No Known Allergies  Antimicrobials this admission: Unasyn 1/8 >> 1/9;; 1/11 >> 1/15 Vancomycin 1/9 >> 1/11, 1/17>>1/19 Zosyn 1/9 >> 1/11 Fortaz 1/17>>1/19 Cefazolin 1/19>>  Levels/dose changes this admission: None  Microbiology results: 1/7 MRSA PCR negative 1/7 BCx x2: NG final 1/7 UCx: NG final 1/16 BCx x2: ngtd 1/16 UCx: ng 1/16 trach aspir: kleb pneumonia (r to ampicillin)  Thank you for allowing pharmacy to be a part of this patient's care.   Heloise Ochoa, [place].D., BCPS PGY2 Pharmacy Resident Pager: [phone]  09/19/2015 9:57 AM

## 2015-09-19 NOTE — Progress Notes (Signed)
PULMONARY / CRITICAL CARE MEDICINE   Name: Patrick [facility] MRN: [id number] DOB: May 21, 1946    ADMISSION DATE:  09/12/2015 CONSULTATION DATE:  09/27/2015   REFERRING MD :  EDP PRIMARY SERVICE: PCCM   BRIEF PATIENT DESCRIPTION:  57-yo M with 15 min vfib arrest while shoveling snow s/p LHC with no active ischemia in setting of severe CAD with another vfib arrest in cath lab s/p hypothermic protocol and intubation.    SIGNIFICANT EVENTS / STUDIES:  1/7 Vfib arrest, LHC with no intervention but severe LV dysfunction and native/graft disease 1/7 CT head with mild atrophy 1/8 Shock 1/9 Rewarmed 1/9 EEG with generalized slowing  1/10 Follows commands 1/11 Dnr established 1/16 Fevers overnight 1/20 Short period of asystole during TEE 1/21  Marked vent asynchrony requiring NAVA Shock again-->likely d/t poor vent interaction. 1/21 Started on levo and stress steroids   1/22 Started on IV lasix     LINES / TUBES: 1/7 ETT >> 1/7 Left IJ >>  1/7 R rad a line >> 1/12  CULTURES: 1/7 MRSA >> neg 1/7 Urine >> neg 1/7 Blood >>neg  1/16 Blood >> neg 1/16 Respiratory >> Pan-sensitive Klebsiella 1/16 Urine >> neg  ANTIBIOTICS: Vancomycin 1/9 >> 1/11 Zosyn 1/9 >> 1/11 Unasyn 1/11 >> 1/15 Vanc 1/17 >> 1/19 Ceftaz 1/17 >> 1/19 Ancef 1/19 >>    SUBJECTIVE:  Yesterday with fever to 102.9.No acute events overnight. Pt sedated on propofol/fentanyl on vent with FiO2 40 and PEEP 5 with SpO2 of 99%. CVP of 8 on levo (10 currently). Net +5.4 L since admission. Has been unable to ween off vent.   VITAL SIGNS: Temp:  [97.8 F (36.6 C)-102.9 F (39.4 C)] 99 F (37.2 C) (01/22 0800) Pulse Rate:  [70-110] 81 (01/22 1000) Resp:  [16-43] 24 (01/22 1000) BP: (76-122)/(41-75) 117/61 mmHg (01/22 1000) SpO2:  [94 %-100 %] 100 % (01/22 1000) FiO2 (%):  [40 %] 40 % (01/22 0850) Weight:  [171 lb 8.3 oz (77.8 kg)] 171 lb 8.3 oz (77.8 kg) (01/22 0400) HEMODYNAMICS: CVP:  [8 mmHg-16 mmHg] 8  mmHg VENTILATOR SETTINGS: Vent Mode:  [-] PRVC FiO2 (%):  [40 %] 40 % Set Rate:  [30 bmp] 30 bmp Vt Set:  [450 mL] 450 mL PEEP:  [5 cmH20] 5 cmH20 Pressure Support:  [10 cmH20] 10 cmH20 Plateau Pressure:  [18 cmH20-35 cmH20] 21 cmH20 INTAKE / OUTPUT: Intake/Output      01/21 0701 - 01/22 0700 01/22 0701 - 01/23 0700   I.V. (mL/kg) 2571.2 (33) 173.4 (2.2)   NG/GT 2615.3    IV Piggyback 100    Total Intake(mL/kg) 5286.5 (68) 173.4 (2.2)   Urine (mL/kg/hr) 1625 (0.9)    Stool     Total Output 1625     Net +3661.5 +173.4          PHYSICAL EXAMINATION: General: Ill appearing, sedated on vent Neuro:  RAAS -3 HEENT:  Normocephalic, OG in place  Cardiovascular:  Irregularly irregular rhyhehm with normal rate Lungs:  Ronchi/coarse in anterior fields Abdomen:  Soft, non-tender, non-distended, decreased BS Musculoskeletal: SCD's, trace  LE edema Skin: No rashes  LABS:  CBC  Recent Labs Lab 09/17/15 2240 09/18/15 0359 09/19/15 0400  WBC 18.6* 21.0* 25.5*  HGB 8.3* 8.6* 8.3*  HCT 27.9* 29.3* 28.5*  PLT 343 382 505*   Coag's  Recent Labs Lab 09/15/15 0444  INR 1.43   BMET  Recent Labs Lab 09/17/15 2240 09/18/15 0359 09/19/15 0400  NA 150* 153* 150*  K 4.4 3.7 4.1  CL 114* 115* 113*  CO2 28 28 27   BUN 50* 55* 56*  CREATININE 1.28* 1.20 1.15  GLUCOSE 178* 172* 252*   Electrolytes  Recent Labs Lab 09/14/15 0511 09/15/15 0444  09/16/15 0500  09/17/15 1045  09/17/15 2240 09/18/15 0359 09/19/15 0400  CALCIUM 7.3* 7.3*  < > 7.2*  < > 7.7*  < > 7.5* 7.6* 7.9*  MG 2.2 2.2  --  2.4  --  3.2*  --  2.7* 2.8*  --   PHOS 3.8 3.7  --  4.7*  --   --   --   --   --   --   < > = values in this interval not displayed. Sepsis Markers No results for input(s): LATICACIDVEN, PROCALCITON, O2SATVEN in the last 168 hours. ABG  Recent Labs Lab 09/18/15 0837 09/18/15 1221 09/19/15 0348  PHART 7.346* 7.488* 7.462*  PCO2ART 48.6* 35.2 34.9*  PO2ART 106.0* 110.0*  131*   Liver Enzymes  Recent Labs Lab 09/19/15 0400  AST 26  ALT 30  ALKPHOS 66  BILITOT 1.0  ALBUMIN 2.5*   Cardiac Enzymes No results for input(s): TROPONINI, PROBNP in the last 168 hours. Glucose  Recent Labs Lab 09/18/15 1229 09/18/15 1610 09/18/15 1958 09/18/15 2339 09/19/15 0353 09/19/15 0810  GLUCAP 132* 147* 198* 196* 216* 208*    Imaging Dg Chest Port 1 View  09/18/2015  CLINICAL DATA:  Endotracheal intubation EXAM: PORTABLE CHEST - 1 VIEW COMPARISON:  09/17/2015 FINDINGS: Cardiac shadow remains enlarged. Postsurgical changes are again seen. An endotracheal tube is noted 5.7 cm above the carina. Nasogastric catheter extends into the stomach. A left jugular central line is seen in the mid superior vena cava. Diffuse patchy infiltrates are again seen bilaterally. No new focal infiltrate is seen. No sizable effusion is noted. IMPRESSION: Tubes and lines in satisfactory position. Stable bilateral airspace changes. Electronically Signed   By: Inez Catalina M.D.   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) - +5.4 L since admission Hyperlipidemia  P:  Cardiology following  Levophed to keep MAP >65 Solumedrol 60 Q 6 hr day 2/5   Amiodorone drip and digoxin 0/125 mg daily Aspirin 81 mg daily  Lipitor 80 mg daily  Start IV lasix 80 mg BID per cards No DCCV Hold home  lisinopril 20 mg daily   RENAL A: Nonoliguiric AKI - resolved  Hypernatremia -improving  P:   Free water 300 mL Q 4 hr Monitor BMP and UOP  GASTROINTESTINAL A:  Nutrition   GI PPx P:  Tube feeds   Discuss with family regarding PEG  IV protonix 40 mg daily  Reglan 5 mg QID Senokot-S daily   HEMATOLOGIC A:  Acute normocytic anemia in setting of critical illness - no active bleeding Reactive thrombocytosis  DVT Ppx P:  Monitor CBC Transfuse if Hg <7  SCD's  INFECTIOUS A:  Pansensitive pseudomonas Aspiration PNA - febrile yesteday Leukocytosis - uptrending  P:   IV Ancef 2 g Q 8 hr Day 4/7 Monitor PCT  Monitor CBC and fever curve  ENDOCRINE A:  Hyperglycemia  Relative adrenal insufficiency  Low T3/high T4/high TSH in setting of critical illness and amiodarone use P:   Solumedrol 60 Q 6 hr day 2/5   Lantus 10 U daily SSI for goal CBG<180 Needs repeat TSH in 6 weeks  NEUROLOGIC A:  Sedation/agitaiton on vent DNR status  P:   Try to wean fentanyl and propofol infusions  Fentanyl PRN Monitor TG level  WUA daily   Juluis Mire, MD PGY-3 IMTS Pager [phone]  09/19/2015, 10:23 AM

## 2015-09-19 NOTE — Progress Notes (Signed)
Patient ID: Patrick Sexton [facility], male   DOB: [birth year], 70 y.o.   MRN: [id number]  Advanced Heart Failure Consult Note  Referring MD: Dr Elsworth Soho PCP: Carmon Ginsberg, PA Primary Cardiologist: Dr Clayborn Bigness  Subjective:    SIGNIFICANT EVENTS: 01/07 cardiac arrest/intubated/sedated/emergent cath 01/08 shock 01/09 rewarm 01/10 follows commands 01/11 dnr established, [facility] signed off 01/16 fevers overnight, BCx sent. HF team asked to see. Fever spike 103, UA sent. 01/20  attempted TEE-guided DCCV.  Patient was sedated with 2 mg IV Versed in addition to his Precedex and Fentanyl.  Upon passing TEE probe into esophagus, he became asystolic and was immediately paced externally.  He also received 1 mg IV atropine.  He recovered pulse promptly with pacing and had a native perfusing rhythm again quickly.  He remained in atrial fibrillation.  Amiodarone gtt initially held but then restarted when HR went > 100 bpm.  BP stable.   STUDIES:  CT head 1/7>> mild atrophy LHC 1/7>> no intervention but severe LV dysfunction and severe native and graft disease CXR 1/9>> EEG 1/9>>Abnormal with moderate generalized nonspecific continuous slowing of cerebral activity, no epileptic disorder Echo 09/13/15>> LVEF 30-35%, mildly reduce RV, mild MR, mod TR, PA peak pressure 60 mm Hg  Norepinephrine started yesterday, remains on 10 this morning with stable BP.  Fever to 102.9 in afternoon, afebrile today.  WBCs remain high. CVP around 12 with co-ox 82%. I/Os very positive, weight up 3 lbs.    Objective:   Weight Range: 171 lb 8.3 oz (77.8 kg) Body mass index is 25.32 kg/(m^2).   Vital Signs:   Temp:  [97.8 F (36.6 C)-102.9 F (39.4 C)] 98.3 F (36.8 C) (01/22 0400) Pulse Rate:  [70-124] 80 (01/22 0700) Resp:  [17-43] 20 (01/22 0700) BP: (76-144)/(41-92) 120/75 mmHg (01/22 0700) SpO2:  [94 %-100 %] 100 % (01/22 0700) FiO2 (%):  [40 %] 40 % (01/22 0400) Weight:  [171 lb 8.3 oz (77.8 kg)] 171 lb 8.3 oz (77.8 kg)  (01/22 0400) Last BM Date: 09/17/15  Weight change: Filed Weights   09/17/15 0300 09/18/15 0400 09/19/15 0400  Weight: 169 lb 8.5 oz (76.9 kg) 168 lb 6.9 oz (76.4 kg) 171 lb 8.3 oz (77.8 kg)    Intake/Output:   Intake/Output Summary (Last 24 hours) at 09/19/15 0742 Last data filed at 09/19/15 0600  Gross per 24 hour  Intake 5286.52 ml  Output   1625 ml  Net 3661.52 ml     Physical Exam: CVP: 12 General: Chronically ill appearing, intubated and sedated. HEENT: Remains on vent.  Neck: supple. JVP appears elevated . Carotids 2+ bilat; no bruits. No thyromegaly or nodule noted. Cor: PMI nondisplaced. Irregularly irregular. Lungs: Mechanical breath sounds.  Abdomen: soft, non-distended, no HSM. No bruits or masses. +BS  Extremities: no cyanosis, clubbing, rash. Trace-1+ LE edema bilaterally.  Neuro: Intubated, sedated   Telemetry: Reviewed personally, Afib 80s with LBBB  Labs: CBC  Recent Labs  09/18/15 0359 09/19/15 0400  WBC 21.0* 25.5*  HGB 8.6* 8.3*  HCT 29.3* 28.5*  MCV 86.9 88.0  PLT 382 [id number]*   Basic Metabolic Panel  Recent Labs  09/17/15 2240 09/18/15 0359 09/19/15 0400  NA 150* 153* 150*  K 4.4 3.7 4.1  CL 114* 115* 113*  CO2 28 28 27   GLUCOSE 178* 172* 252*  BUN 50* 55* 56*  CREATININE 1.28* 1.20 1.15  CALCIUM 7.5* 7.6* 7.9*  MG 2.7* 2.8*  --    Liver Function Tests  Recent Labs  09/19/15 0400  AST 26  ALT 30  ALKPHOS 66  BILITOT 1.0  PROT 5.6*  ALBUMIN 2.5*   No results for input(s): LIPASE, AMYLASE in the last 72 hours. Cardiac Enzymes No results for input(s): CKTOTAL, CKMB, CKMBINDEX, TROPONINI in the last 72 hours.  BNP: BNP (last 3 results)  Recent Labs  09/22/2015 1520 09/07/15 1057  BNP 235.5* 670.0*    ProBNP (last 3 results) No results for input(s): PROBNP in the last 8760 hours.   D-Dimer No results for input(s): DDIMER in the last 72 hours. Hemoglobin A1C No results for input(s): HGBA1C in the last 72  hours. Fasting Lipid Panel  Recent Labs  09/19/15 0400  TRIG 259*   Thyroid Function Tests No results for input(s): TSH, T4TOTAL, T3FREE, THYROIDAB in the last 72 hours.  Invalid input(s): FREET3  Other results:     Imaging/Studies:  Dg Chest Port 1 View  09/18/2015  CLINICAL DATA:  Endotracheal intubation EXAM: PORTABLE CHEST - 1 VIEW COMPARISON:  09/17/2015 FINDINGS: Cardiac shadow remains enlarged. Postsurgical changes are again seen. An endotracheal tube is noted 5.7 cm above the carina. Nasogastric catheter extends into the stomach. A left jugular central line is seen in the mid superior vena cava. Diffuse patchy infiltrates are again seen bilaterally. No new focal infiltrate is seen. No sizable effusion is noted. IMPRESSION: Tubes and lines in satisfactory position. Stable bilateral airspace changes. Electronically Signed   By: Inez Catalina M.D.   On: 09/18/2015 09:13    Latest Echo  Latest Cath   Medications:     Scheduled Medications: . albuterol  2.5 mg Nebulization Q4H  . antiseptic oral rinse  7 mL Mouth Rinse 10 times per day  . aspirin  81 mg Oral Daily  . atorvastatin  80 mg Oral q1800  .  ceFAZolin (ANCEF) IV  2 g Intravenous Q8H  . chlorhexidine gluconate  15 mL Mouth Rinse BID  . digoxin  0.125 mg Per Tube Daily  . free water  300 mL Per Tube Q4H  . insulin aspart  2-6 Units Subcutaneous 6 times per day  . insulin glargine  10 Units Subcutaneous Q24H  . methylPREDNISolone (SOLU-MEDROL) injection  60 mg Intravenous Q6H  . metoCLOPramide (REGLAN) injection  5 mg Intravenous 4 times per day  . pantoprazole sodium  40 mg Per Tube Daily  . polyethylene glycol  17 g Per Tube Daily  . potassium chloride  40 mEq Per Tube BID  . senna-docusate  1 tablet Per Tube QHS  . sodium chloride  10-40 mL Intracatheter Q12H  . sodium chloride  3 mL Intravenous Q12H  . sodium chloride  3 mL Intravenous Q12H    Infusions: . sodium chloride Stopped (09/16/15 0830)  .  amiodarone 30 mg/hr (09/18/15 2118)  . dextrose 1,000 mL (09/18/15 1000)  . feeding supplement (VITAL AF 1.2 CAL) Stopped (09/19/15 0600)  . fentaNYL infusion INTRAVENOUS 400 mcg/hr (09/19/15 0430)  . norepinephrine (LEVOPHED) Adult infusion 10 mcg/min (09/18/15 2027)  . propofol (DIPRIVAN) infusion 80 mcg/kg/min (09/19/15 0510)    PRN Medications: sodium chloride, acetaminophen, albuterol, fentaNYL, ondansetron (ZOFRAN) IV, pneumococcal 23 valent vaccine, sodium chloride, sodium chloride, sodium chloride   Assessment/Plan   Patrick Sexton is a 70 y.o. male with history of CAD s/p CABG x 4 2008, PAF, HTN, Ischemic cardiomyopathy Echo 03/2015 EF 45-50%, and dyslipidemia who presented to [facility] 09/28/2015 after cardiac arrest. Emergent cath with severe CAD, EF 25%, and no culprit. Medical management. Remains  on vent. HF team asked to see for second opinion regarding treatment of CAD/CHF (i.e. Advanced therapies) and recurrent SVT with attempts to wean vent.  1. Acute hypoxemic respiratory failure: Bilateral airspace disease, suspect aspiration post-arrest. Afebrile last night, respiratory cultures with Klebsiella.  - I/Os very positive, weight up 3 lbs.  Would start back on Lasix 80 mg IV bid today.  - Antibiotic coverage with Ancef for Klebsiella PNA.  - Concerned that he will continue to be difficult to wean from vent.    2. CAD: Severe native vessel disease with LIMA-LAD as the only functional graft. Based on the appearance of the cath, his arrest may have been scar-mediated VF rather than primary plaque rupture/ACS. No interventional or redo CABG option.  - Continue ASA 81, atorvastatin.  3. Acute on chronic systolic CHF: LV-gram with EF 25%, echo 09/13/15 with EF 30-35%. Co-ox better today on norepinephrine. CVP 12. - Continue digoxin, follow levels.  - Lasix 80 mg IV bid today with aggressive K repletion (K normal this morning).  - On norepinephrine likely primarily for vasodilatory  shock => will wean as able.  4. Atrial fibrillation: Patient is currently in atrial fibrillation with rate improved in the 70s on amiodarone gtt.  - Continue amiodarone and digoxin for rate control. - Now off heparin gtt with recurrent hematuria.   - Patient went asystolic briefly with passing of TEE probe to esophagus.  Will not be doing TEE-guided DCCV.  Continue to control HR with amiodarone.  5. Hypernatremia: Sodium trending down with free water boluses. 6. Renal: Creatinine stable today with some rise in BUN.   7. ID: Afebrile with Klebsiella PNA.  On Ancef.  Noted rise again in WBCs.   Family not here yet this morning.    Loralie Champagne 09/19/2015 7:42 AM

## 2015-09-20 ENCOUNTER — Inpatient Hospital Stay (HOSPITAL_COMMUNITY): Payer: Medicare Other

## 2015-09-20 DIAGNOSIS — I63133 Cerebral infarction due to embolism of bilateral carotid arteries: Secondary | ICD-10-CM

## 2015-09-20 DIAGNOSIS — Z66 Do not resuscitate: Secondary | ICD-10-CM

## 2015-09-20 DIAGNOSIS — J9611 Chronic respiratory failure with hypoxia: Secondary | ICD-10-CM

## 2015-09-20 DIAGNOSIS — J9690 Respiratory failure, unspecified, unspecified whether with hypoxia or hypercapnia: Secondary | ICD-10-CM | POA: Insufficient documentation

## 2015-09-20 LAB — MAGNESIUM: Magnesium: 2.6 mg/dL — ABNORMAL HIGH (ref 1.7–2.4)

## 2015-09-20 LAB — GLUCOSE, CAPILLARY
GLUCOSE-CAPILLARY: 161 mg/dL — AB (ref 65–99)
Glucose-Capillary: 181 mg/dL — ABNORMAL HIGH (ref 65–99)
Glucose-Capillary: 149 mg/dL — ABNORMAL HIGH (ref 65–99)
Glucose-Capillary: 162 mg/dL — ABNORMAL HIGH (ref 65–99)
Glucose-Capillary: 161 mg/dL — ABNORMAL HIGH (ref 65–99)
Glucose-Capillary: 157 mg/dL — ABNORMAL HIGH (ref 65–99)

## 2015-09-20 LAB — CBC
HEMATOCRIT: 25.1 % — AB (ref 39.0–52.0)
Hemoglobin: 7.5 g/dL — ABNORMAL LOW (ref 13.0–17.0)
MCH: 26 pg (ref 26.0–34.0)
MCHC: 29.9 g/dL — ABNORMAL LOW (ref 30.0–36.0)
MCV: 86.9 fL (ref 78.0–100.0)
Platelets: 311 10*3/uL (ref 150–400)
RBC: 2.89 MIL/uL — ABNORMAL LOW (ref 4.22–5.81)
RDW: 19.2 % — AB (ref 11.5–15.5)
WBC: 16.3 10*3/uL — AB (ref 4.0–10.5)

## 2015-09-20 LAB — CARBOXYHEMOGLOBIN
CARBOXYHEMOGLOBIN: 1.6 % — AB (ref 0.5–1.5)
METHEMOGLOBIN: 0.8 % (ref 0.0–1.5)
O2 SAT: 75.6 %
Total hemoglobin: 7.2 g/dL — ABNORMAL LOW (ref 13.5–18.0)

## 2015-09-20 LAB — BASIC METABOLIC PANEL
Anion gap: 12 (ref 5–15)
BUN: 64 mg/dL — AB (ref 6–20)
CHLORIDE: 110 mmol/L (ref 101–111)
CO2: 28 mmol/L (ref 22–32)
Calcium: 7.7 mg/dL — ABNORMAL LOW (ref 8.9–10.3)
Creatinine, Ser: 1.24 mg/dL (ref 0.61–1.24)
GFR calc Af Amer: >60 mL/min (ref >60)
GFR calc non Af Amer: 58 mL/min — ABNORMAL LOW (ref >60)
GLUCOSE: 175 mg/dL — AB (ref 65–99)
POTASSIUM: 4 mmol/L (ref 3.5–5.1)
Sodium: 150 mmol/L — ABNORMAL HIGH (ref 135–145)

## 2015-09-20 LAB — PROCALCITONIN: Procalcitonin: 0.92 ng/mL

## 2015-09-20 LAB — DIGOXIN LEVEL: Digoxin Level: 0.4 ng/mL — ABNORMAL LOW (ref 0.8–2.0)

## 2015-09-20 LAB — PHOSPHORUS: PHOSPHORUS: 3.3 mg/dL (ref 2.5–4.6)

## 2015-09-20 MED ORDER — LACTULOSE 10 GM/15ML PO SOLN
30.0000 g | Freq: Two times a day (BID) | ORAL | Status: DC
  Administered 2015-09-20 (×2): 30 g
  Filled 2015-09-20 (×2): qty 45

## 2015-09-20 MED ORDER — POTASSIUM CHLORIDE 20 MEQ/15ML (10%) PO SOLN: 40.0000 meq | Freq: Every day | ORAL | Status: DC

## 2015-09-20 MED ORDER — VANCOMYCIN HCL IN DEXTROSE 750-5 MG/150ML-% IV SOLN
750.0000 mg | Freq: Two times a day (BID) | INTRAVENOUS | Status: DC
  Administered 2015-09-21: 750 mg via INTRAVENOUS
  Filled 2015-09-20 (×3): qty 150

## 2015-09-20 MED ORDER — FUROSEMIDE 10 MG/ML PO SOLN
40.0000 mg | Freq: Two times a day (BID) | ORAL | Status: DC
  Administered 2015-09-20 (×2): 40 mg via ORAL
  Filled 2015-09-20: qty 4
  Filled 2015-09-20 (×2): qty 5
  Filled 2015-09-20: qty 4
  Filled 2015-09-20: qty 5
  Filled 2015-09-20 (×2): qty 4

## 2015-09-20 MED ORDER — FREE WATER
300.0000 mL | Status: DC
  Administered 2015-09-20 – 2015-09-21 (×8): 300 mL

## 2015-09-20 MED ORDER — VANCOMYCIN HCL 10 G IV SOLR
1250.0000 mg | Freq: Once | INTRAVENOUS | Status: AC
  Administered 2015-09-20: 1250 mg via INTRAVENOUS
  Filled 2015-09-20 (×2): qty 1250

## 2015-09-20 MED ORDER — VANCOMYCIN HCL IN DEXTROSE 1-5 GM/200ML-% IV SOLN
1000.0000 mg | Freq: Once | INTRAVENOUS | Status: DC
  Filled 2015-09-20: qty 200

## 2015-09-20 MED ORDER — CEFAZOLIN SODIUM-DEXTROSE 2-3 GM-% IV SOLR
2.0000 g | Freq: Three times a day (TID) | INTRAVENOUS | Status: DC
  Administered 2015-09-20 – 2015-09-21 (×4): 2 g via INTRAVENOUS
  Filled 2015-09-20 (×6): qty 50

## 2015-09-20 MED ORDER — HYDROCORTISONE 20 MG PO TABS
50.0000 mg | ORAL_TABLET | Freq: Four times a day (QID) | ORAL | Status: DC
  Administered 2015-09-20 – 2015-09-21 (×5): 50 mg
  Filled 2015-09-20 (×9): qty 1

## 2015-09-20 NOTE — Progress Notes (Signed)
EEG completed; results pending.    

## 2015-09-20 NOTE — Progress Notes (Addendum)
PULMONARY / CRITICAL CARE MEDICINE   Name: Gorje Iyer [facility] MRN: [id number] DOB: Jun 11, 1946    ADMISSION DATE:  09/20/2015 CONSULTATION DATE:  09/14/2015   REFERRING MD :  EDP PRIMARY SERVICE: PCCM   BRIEF PATIENT DESCRIPTION:  80-yo M with 15 min vfib arrest while shoveling snow s/p LHC with no active ischemia in setting of severe CAD with another vfib arrest in cath lab s/p hypothermic protocol and intubation.    SIGNIFICANT EVENTS / STUDIES:  1/7 Vfib arrest, LHC with no intervention but severe LV dysfunction and native/graft disease 1/7 CT head with mild atrophy 1/8 Shock 1/9 Rewarmed, followed commands 1/9 EEG with generalized slowing  1/10 Follows commands 1/11 Dnr established 1/16 Fevers overnight 1/20 Short period of asystole during TEE 1/21  Marked vent asynchrony requiring NAVA Shock again-->likely d/t poor vent interaction. 1/21 Started on levo and stress steroids   1/22 Started on IV lasix, weaned off levo  LINES / TUBES: 1/7 ETT >> 1/7 Left IJ >>  1/7 R rad a line >> 1/12  CULTURES: 1/7 MRSA >> neg 1/7 Urine >> neg 1/7 Blood >>neg  1/16 Blood >> neg 1/16 Respiratory >> Pan-sensitive moderate Klebsiella Pneumoniae 1/16 Urine >> neg 1/22 Blood >>  ANTIBIOTICS: Vancomycin 1/9 >> 1/11 Zosyn 1/9 >> 1/11 Unasyn 1/11 >> 1/15 Vanc 1/17 >> 1/19 Ceftaz 1/17 >> 1/19 Ancef 1/19 >>   SUBJECTIVE:  Yesterday with fever to 100.9. No acute events overnight. Pt sedated on versed/fentanyl on vent with FiO2 30 and PEEP 5 with SpO2 of 98%. CVP of 7 no longer on levo.  Net +4.9 L since admission with 3.4L output since starting IV lasix yesterday. Has been unable to ween off vent due to agitation. Per nurse has been requiring a lot of sunctioning.   VITAL SIGNS: Temp:  [99 F (37.2 C)-100.9 F (38.3 C)] 99.2 F (37.3 C) (01/23 0400) Pulse Rate:  [60-96] 60 (01/23 0700) Resp:  [16-34] 30 (01/23 0700) BP: (91-136)/(53-71) 91/55 mmHg (01/23 0700) SpO2:  [97 %-100 %] 98  % (01/23 0700) FiO2 (%):  [30 %-40 %] 30 % (01/23 0400) Weight:  [170 lb 10.2 oz (77.4 kg)] 170 lb 10.2 oz (77.4 kg) (01/23 0300) HEMODYNAMICS: CVP:  [6 mmHg-15 mmHg] 7 mmHg VENTILATOR SETTINGS: Vent Mode:  [-] PRVC FiO2 (%):  [30 %-40 %] 30 % Set Rate:  [30 bmp] 30 bmp Vt Set:  [450 mL] 450 mL PEEP:  [5 cmH20] 5 cmH20 Pressure Support:  [10 cmH20] 10 cmH20 Plateau Pressure:  [18 cmH20-24 cmH20] 23 cmH20 INTAKE / OUTPUT: Intake/Output      01/22 0701 - 01/23 0700 01/23 0701 - 01/24 0700   I.V. (mL/kg) 1885.1 (24.4)    NG/GT 900    IV Piggyback 150    Total Intake(mL/kg) 2935.1 (37.9)    Urine (mL/kg/hr) 3450 (1.9)    Total Output 3450     Net -514.9            PHYSICAL EXAMINATION: General: Ill appearing, sedated on vent Neuro:  RAAS -3 on versed/fentanyl  HEENT:  Normocephalic, OG in place  Cardiovascular:  Irregularly irregular rhythm with normal rate Lungs:  Ronchi/coarse in anterior fields Abdomen:  Soft, non-tender, non-distended, decreased BS Musculoskeletal: SCD's, trace LE edema Skin: No rashes  LABS:  CBC  Recent Labs Lab 09/18/15 0359 09/19/15 0400 09/20/15 0410  WBC 21.0* 25.5* 16.3*  HGB 8.6* 8.3* 7.5*  HCT 29.3* 28.5* 25.1*  PLT 382 505* 311   Coag's  Recent Labs Lab 09/15/15 0444  INR 1.43   BMET  Recent Labs Lab 09/18/15 0359 09/19/15 0400 09/20/15 0410  NA 153* 150* 150*  K 3.7 4.1 4.0  CL 115* 113* 110  CO2 '28 27 28  '$ BUN 55* 56* 64*  CREATININE 1.20 1.15 1.24  GLUCOSE 172* 252* 175*   Electrolytes  Recent Labs Lab 09/15/15 0444  09/16/15 0500  09/17/15 2240 09/18/15 0359 09/19/15 0400 09/20/15 0410  CALCIUM 7.3*  < > 7.2*  < > 7.5* 7.6* 7.9* 7.7*  MG 2.2  --  2.4  < > 2.7* 2.8*  --  2.6*  PHOS 3.7  --  4.7*  --   --   --   --  3.3  < > = values in this interval not displayed. Sepsis Markers  Recent Labs Lab 09/20/15 0410  PROCALCITON 0.92   ABG  Recent Labs Lab 09/18/15 0837 09/18/15 1221 09/19/15 0348   PHART 7.346* 7.488* 7.462*  PCO2ART 48.6* 35.2 34.9*  PO2ART 106.0* 110.0* 131*   Liver Enzymes  Recent Labs Lab 09/19/15 0400  AST 26  ALT 30  ALKPHOS 66  BILITOT 1.0  ALBUMIN 2.5*   Cardiac Enzymes No results for input(s): TROPONINI, PROBNP in the last 168 hours. Glucose  Recent Labs Lab 09/19/15 0810 09/19/15 1112 09/19/15 1540 09/19/15 1939 09/19/15 2325 09/20/15 0355  GLUCAP 208* 207* 181* 183* 171* 181*    Imaging Dg Chest Port 1 View  09/20/2015  CLINICAL DATA:  Hypoxia EXAM: PORTABLE CHEST 1 VIEW COMPARISON:  September 19, 2015 FINDINGS: Endotracheal tube tip is 4.2 cm above the carina. Nasogastric tube and feeding tube tips are below the diaphragm. Central catheter tip is in the superior vena cava. No pneumothorax. There is moderate interstitial edema bilaterally. There is patchy alveolar opacity in the bases, likely alveolar edema. Heart is mildly enlarged with pulmonary venous hypertension. IMPRESSION: Tube and catheter positions as described without pneumothorax. Evidence a degree of congestive heart failure. There may be slightly more alveolar opacity, likely alveolar edema, in the lung bases. It should be noted that superimposed pneumonia in the bases cannot be excluded radiographically. Electronically Signed   By: Lowella Grip III M.D.   On: 09/20/2015 07:06   Dg Chest Port 1 View  09/19/2015  CLINICAL DATA:  Intubation. EXAM: PORTABLE CHEST 1 VIEW COMPARISON:  09/18/2015 FINDINGS: Endotracheal tube is 2 cm superior to the carina. Enteric catheter and feeding catheter are collimated off the image. Left internal jugular approach central venous catheter are stable. Postsurgical changes from median sternotomy and CABG are stable. The cardiac silhouette is enlarged. Mediastinal contours appear intact. There is no evidence of focal airspace consolidation, pleural effusion or pneumothorax. There are coarse interstitial markings. Unchanged patchy scattered bilateral  airspace consolidation. Osseous structures are without acute abnormality. Soft tissues are grossly normal. IMPRESSION: Interval apparent advancement of the endotracheal tube, which terminates 2 cm from the carina. Otherwise stable support apparatus. Cardiomegaly. Stable diffuse bilateral patchy airspace consolidation and chronic interstitial lung changes. Electronically Signed   By: Fidela Salisbury M.D.   On: 09/19/2015 15:37   Dg Chest Port 1 View  09/18/2015  CLINICAL DATA:  Endotracheal intubation EXAM: PORTABLE CHEST - 1 VIEW COMPARISON:  09/17/2015 FINDINGS: Cardiac shadow remains enlarged. Postsurgical changes are again seen. An endotracheal tube is noted 5.7 cm above the carina. Nasogastric catheter extends into the stomach. A left jugular central line is seen in the mid superior vena cava. Diffuse patchy infiltrates are again  seen bilaterally. No new focal infiltrate is seen. No sizable effusion is noted. IMPRESSION: Tubes and lines in satisfactory position. Stable bilateral airspace changes. Electronically Signed   By: Inez Catalina M.D.   On: 09/18/2015 09:13   Dg Abd Portable 1v  09/19/2015  CLINICAL DATA:  Not tolerating tube feeds. EXAM: PORTABLE ABDOMEN - 1 VIEW COMPARISON:  09/02/2015 FINDINGS: The stomach is distended. Enteric catheter is seen within proximal stomach, side hole slightly past the expected location of gastroesophageal junction. Feeding catheter is also seen, tip at the expected location of the distal gastric body. There is relative paucity of small bowel gas. The colonic bowel gas pattern is normal. No evidence of free intra-abdominal fluid on this supine radiograph. A rectal catheter is visualized. IMPRESSION: Distended stomach with enteric and feeding catheters within the proximal and distal gastric body respectively. Relative paucity of small bowel gas. Normal colonic bowel gas pattern. Electronically Signed   By: Fidela Salisbury M.D.   On: 09/19/2015 15:41     CXR:  See above   ASSESSMENT / PLAN:  PULMONARY A: Acute hypoxic respiratory failure requiring ventilatory support in setting of cardiac arrest Pansensitive Klebsiella P.  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) - +4.9 L since admission Hyperlipidemia  P:  Cardiology following  Keep MAP >60 Solumedrol 60 Q 6 hr day 3/5 - consider change to hydroctisone Amiodorone drip and digoxin 0.125 mg daily Aspirin 81 mg daily  Lipitor 80 mg daily  IV lasix 80 mg TID to 40 mg BID per cards  No DCCV in setting of asystole with TEE probe to esophagus  No acei  RENAL A: Nonoliguiric AKI - resolved  Hypernatremia - unchanged at 150  edema P:   Consider increasing free water 300 mL Q 4 hr Monitor BMP and UOP Dc daily K Free water, increase  GASTROINTESTINAL A:  Nutrition   GI PPx P:  Tube feeds   Discuss with family regarding PEG placement - this is NOT offered, futile IV protonix 40 mg daily  Reglan 5 mg QID Senokot-S daily   HEMATOLOGIC A:  Acute normocytic anemia in setting of critical illness - drop to 7.5 with no active bleeding Reactive thrombocytosis - resolved  DVT Ppx P:  Monitor CBC Transfuse if Hg <7 SCD's  INFECTIOUS A:  Pansensitive Klebsiella P. aspiration PNA  Leukocytosis - downtrending  P:   IV Ancef 2 g Q 8 hr to stop date Monitor PCT - 0.92  Monitor CBC and fever curve  ENDOCRINE A:  Hyperglycemia  Relative adrenal insufficiency  Low T3/high T4/high TSH in setting of critical illness and amiodarone use P:   Solumedrol 60 Q 6 hr day 3/5    Lantus 10 U daily SSI for goal CBG<180 Needs repeat TSH in 6 weeks  NEUROLOGIC A:  Sedation/agitaiton on vent DNR status  P:   Try to wean fentanyl and versed infusions  Versed and fentanyl PRN Fentanyl PRN Monitor TG level  WUA daily   Juluis Mire, MD PGY-3 IMTS Pager [phone]  09/20/2015, 7:16 AM   STAFF NOTE: I, Merrie Roof, MD FACP have personally reviewed patient's available data, including medical history, events of note, physical examination and test results as part of my evaluation. I have discussed with resident/NP and other care providers such as pharmacist, RN and RRT. In addition, I personally evaluated patient and elicited key findings of: ronchi bilateral, not following commands, he has CAD / MI with no options, [facility] refused, trach would be medically ineffective and futile and allow him to suffer, plan meeting in am to discuss comfort with me / family, weaning cpap 5 ps 5, goal 2 hrs, ali on pcxr, abx to stop date, increase free water, avoid benzo when able, get esr with lung pattern on amio, dc k around the clock, goal net neg 1 liter , lasix reduced, i have update wife in full at bedside, poor prognosis change steroids to rel AI steroids, wheezing likley was edema The patient is critically ill with multiple organ systems failure and requires high complexity decision making for assessment and support, frequent evaluation and titration of therapies, application of advanced monitoring technologies and extensive interpretation of multiple databases.   Critical Care Time devoted to patient care services described in this note is 30 Minutes. This time reflects time of care of this signee: Merrie Roof, MD FACP. This critical care time does not reflect procedure time, or teaching time or supervisory time of PA/NP/Med student/Med Resident etc but could involve care discussion time. Rest per NP/medical resident whose note is outlined above and that I agree  with   Lavon Paganini. Titus Mould, [place] Pgr: [place] [facility] 09/20/2015 9:19 AM   Extensive d/w after rounds with wife. Again reviewed poor outcome likely and limited options. Plan: rediscuss with his primary [facility] cardiologist and [facility] cvts doc, allow them to assess films etc then give second opinion If no options, then dc vent next 48-72 hr Total ccm time now 85 min   Lavon Paganini. Titus Mould, MD, [place] Pgr: [place] [facility]

## 2015-09-20 NOTE — Progress Notes (Signed)
Critical blood culture results received from RN reported to [facility] MD.

## 2015-09-20 NOTE — Progress Notes (Signed)
During family discussion today with pt's wife, pt requested [place] to contact [facility] (Dr. Krista Blue) and pt's cardiologist Dr. Clayborn Bigness for a second opinion regarding her husband's condition. Spoke this afternoon with Dr. Solmon Ice with [facility] who is familiar with the case. He stated there is no cardiologist at [facility] by the name of Dr. Krista Blue who wife stated had reportably accepted transfer per epic notes on 1/20. He stated that there is nothing else to offer that is not already being done at [facility]. Also spoke with pt's cardiologist Dr. Lujean Amel who recommmended neurology consult to assess brain function in order to help wife make a decision regarding further care. He stated he had also spoken with Dr. Lemar Livings from [facility] last week regarding the case. I called neurology for consult for evaluation of brain function and will also update the wife of these conversations.   Juluis Mire MD PGY-3 IMTS Pager [phone]

## 2015-09-20 NOTE — Progress Notes (Signed)
[facility] Progress Note Patient Name: Patrick Sexton [facility] DOB: 1945-12-09 MRN: [id number]   Date of Service  09/20/2015  HPI/Events of Note  Bcx 1/22 with GPCs  eICU Interventions  Add vanco.     Intervention Category Intermediate Interventions: Other:  [name] 09/20/2015, 6:16 PM

## 2015-09-20 NOTE — Procedures (Signed)
History: 70 yo M s/p cardiac arrest  Sedation: None  Technique: This is a 21 channel routine scalp EEG performed at the bedside with bipolar and monopolar montages arranged in accordance to the international 10/20 system of electrode placement. One channel was dedicated to EKG recording.    Background: The entirety of this recording was performed during sleep with sleep spindles seen bilaterally, there is a prominence of the signal over the left fronto central region consistent with his known history of craniotomy. With noxious stimulation, there is cessation of sleep spindles with increased theta activity, with generalized irregular delta as well.   Photic stimulation: Physiologic driving is not performed  EEG Abnormalities: 1) Generalized irregular slow activity 2) Breech rhythm  Clinical Interpretation: This EEG is consistent with a generalized non-specific cerebral dysfunction(encephalopathy). There was no seizure or seizure predisposition recorded on this study.  Roland Rack, MD [facility] [phone]  If 7pm- 7am, please page neurology on call as listed in [place].

## 2015-09-20 NOTE — Progress Notes (Signed)
Patient ID: Patrick Sexton [facility], male   DOB: 05/12/1946, 70 y.o.   MRN: [id number]  Advanced Heart Failure Consult Note  Referring MD: Dr Elsworth Soho PCP: Carmon Ginsberg, PA Primary Cardiologist: Dr Clayborn Bigness  Subjective:    SIGNIFICANT EVENTS: 01/07 cardiac arrest/intubated/sedated/emergent cath 01/08 shock 01/09 rewarm 01/10 follows commands 01/11 dnr established, [facility] signed off 01/16 fevers overnight, BCx sent. HF team asked to see. Fever spike 103, UA sent. 01/20  attempted TEE-guided DCCV.  Patient was sedated with 2 mg IV Versed in addition to his Precedex and Fentanyl.  Upon passing TEE probe into esophagus, he became asystolic and was immediately paced externally.  He also received 1 mg IV atropine.  He recovered pulse promptly with pacing and had a native perfusing rhythm again quickly.  He remained in atrial fibrillation.  Amiodarone gtt initially held but then restarted when HR went > 100 bpm.  BP stable.   STUDIES:  CT head 1/7>> mild atrophy LHC 1/7>> no intervention but severe LV dysfunction and severe native and graft disease CXR 1/9>> EEG 1/9>>Abnormal with moderate generalized nonspecific continuous slowing of cerebral activity, no epileptic disorder Echo 09/13/15>> LVEF 30-35%, mildly reduce RV, mild MR, mod TR, PA peak pressure 60 mm Hg  Yesterday he was diuresed with IV lasix. Off norepi. Remains on Amio. Hgb down to 7.5 . Tmax 100.9 .Cultures obtained.     Objective:   Weight Range: 170 lb 10.2 oz (77.4 kg) Body mass index is 25.19 kg/(m^2).   Vital Signs:   Temp:  [99 F (37.2 C)-100.9 F (38.3 C)] 99.2 F (37.3 C) (01/23 0400) Pulse Rate:  [72-96] 80 (01/23 0600) Resp:  [16-34] 28 (01/23 0600) BP: (94-136)/(53-75) 94/57 mmHg (01/23 0600) SpO2:  [97 %-100 %] 98 % (01/23 0600) FiO2 (%):  [30 %-40 %] 30 % (01/23 0400) Weight:  [170 lb 10.2 oz (77.4 kg)] 170 lb 10.2 oz (77.4 kg) (01/23 0300) Last BM Date: 09/17/15  Weight change: Filed Weights   09/18/15 0400  09/19/15 0400 09/20/15 0300  Weight: 168 lb 6.9 oz (76.4 kg) 171 lb 8.3 oz (77.8 kg) 170 lb 10.2 oz (77.4 kg)    Intake/Output:   Intake/Output Summary (Last 24 hours) at 09/20/15 0659 Last data filed at 09/20/15 0600  Gross per 24 hour  Intake 2935.11 ml  Output   3450 ml  Net -514.89 ml     Physical Exam: CVP: 4 General: Chronically ill appearing, intubated and sedated. HEENT: Remains on vent.  Neck: supple. JVP appears elevated . Carotids 2+ bilat; no bruits. No thyromegaly or nodule noted.LIJ  Cor: PMI nondisplaced. Irregularly irregular. Lungs: Mechanical breath sounds.  Abdomen: soft, non-distended, no HSM. No bruits or masses. +BS  Extremities: no cyanosis, clubbing, rash. No edema. R and LLE SCDs.   Neuro: Intubated, sedated  GU: Foley    Telemetry: Reviewed personally, Afib 70ss with LBBB  Labs: CBC  Recent Labs  09/19/15 0400 09/20/15 0410  WBC 25.5* 16.3*  HGB 8.3* 7.5*  HCT 28.5* 25.1*  MCV 88.0 86.9  PLT 505* [id number]   Basic Metabolic Panel  Recent Labs  09/18/15 0359 09/19/15 0400 09/20/15 0410  NA 153* 150* 150*  K 3.7 4.1 4.0  CL 115* 113* 110  CO2 28 27 28   GLUCOSE 172* 252* 175*  BUN 55* 56* 64*  CREATININE 1.20 1.15 1.24  CALCIUM 7.6* 7.9* 7.7*  MG 2.8*  --  2.6*  PHOS  --   --  3.3   Liver Function  Tests  Recent Labs  09/19/15 0400  AST 26  ALT 30  ALKPHOS 66  BILITOT 1.0  PROT 5.6*  ALBUMIN 2.5*   No results for input(s): LIPASE, AMYLASE in the last 72 hours. Cardiac Enzymes No results for input(s): CKTOTAL, CKMB, CKMBINDEX, TROPONINI in the last 72 hours.  BNP: BNP (last 3 results)  Recent Labs  09/05/2015 1520 09/07/15 1057  BNP 235.5* 670.0*    ProBNP (last 3 results) No results for input(s): PROBNP in the last 8760 hours.   D-Dimer No results for input(s): DDIMER in the last 72 hours. Hemoglobin A1C No results for input(s): HGBA1C in the last 72 hours. Fasting Lipid Panel  Recent Labs  09/19/15 0400   TRIG 259*   Thyroid Function Tests No results for input(s): TSH, T4TOTAL, T3FREE, THYROIDAB in the last 72 hours.  Invalid input(s): FREET3  Other results:     Imaging/Studies:  Dg Chest Port 1 View  09/19/2015  CLINICAL DATA:  Intubation. EXAM: PORTABLE CHEST 1 VIEW COMPARISON:  09/18/2015 FINDINGS: Endotracheal tube is 2 cm superior to the carina. Enteric catheter and feeding catheter are collimated off the image. Left internal jugular approach central venous catheter are stable. Postsurgical changes from median sternotomy and CABG are stable. The cardiac silhouette is enlarged. Mediastinal contours appear intact. There is no evidence of focal airspace consolidation, pleural effusion or pneumothorax. There are coarse interstitial markings. Unchanged patchy scattered bilateral airspace consolidation. Osseous structures are without acute abnormality. Soft tissues are grossly normal. IMPRESSION: Interval apparent advancement of the endotracheal tube, which terminates 2 cm from the carina. Otherwise stable support apparatus. Cardiomegaly. Stable diffuse bilateral patchy airspace consolidation and chronic interstitial lung changes. Electronically Signed   By: Fidela Salisbury M.D.   On: 09/19/2015 15:37   Dg Chest Port 1 View  09/18/2015  CLINICAL DATA:  Endotracheal intubation EXAM: PORTABLE CHEST - 1 VIEW COMPARISON:  09/17/2015 FINDINGS: Cardiac shadow remains enlarged. Postsurgical changes are again seen. An endotracheal tube is noted 5.7 cm above the carina. Nasogastric catheter extends into the stomach. A left jugular central line is seen in the mid superior vena cava. Diffuse patchy infiltrates are again seen bilaterally. No new focal infiltrate is seen. No sizable effusion is noted. IMPRESSION: Tubes and lines in satisfactory position. Stable bilateral airspace changes. Electronically Signed   By: Inez Catalina M.D.   On: 09/18/2015 09:13   Dg Abd Portable 1v  09/19/2015  CLINICAL DATA:   Not tolerating tube feeds. EXAM: PORTABLE ABDOMEN - 1 VIEW COMPARISON:  09/02/2015 FINDINGS: The stomach is distended. Enteric catheter is seen within proximal stomach, side hole slightly past the expected location of gastroesophageal junction. Feeding catheter is also seen, tip at the expected location of the distal gastric body. There is relative paucity of small bowel gas. The colonic bowel gas pattern is normal. No evidence of free intra-abdominal fluid on this supine radiograph. A rectal catheter is visualized. IMPRESSION: Distended stomach with enteric and feeding catheters within the proximal and distal gastric body respectively. Relative paucity of small bowel gas. Normal colonic bowel gas pattern. Electronically Signed   By: Fidela Salisbury M.D.   On: 09/19/2015 15:41    Latest Echo  Latest Cath   Medications:     Scheduled Medications: . albuterol  2.5 mg Nebulization Q4H  . antiseptic oral rinse  7 mL Mouth Rinse 10 times per day  . aspirin  81 mg Oral Daily  . atorvastatin  80 mg Oral q1800  .  ceFAZolin (ANCEF) IV  2 g Intravenous Q8H  . chlorhexidine gluconate  15 mL Mouth Rinse BID  . digoxin  0.125 mg Per Tube Daily  . free water  300 mL Per Tube 3 times per day  . furosemide  80 mg Intravenous TID  . insulin aspart  2-6 Units Subcutaneous 6 times per day  . insulin glargine  10 Units Subcutaneous Q24H  . methylPREDNISolone (SOLU-MEDROL) injection  60 mg Intravenous Q6H  . metoCLOPramide (REGLAN) injection  5 mg Intravenous 4 times per day  . pantoprazole sodium  40 mg Per Tube Daily  . polyethylene glycol  17 g Per Tube Daily  . potassium chloride  40 mEq Per Tube BID  . senna-docusate  1 tablet Per Tube QHS  . sodium chloride  10-40 mL Intracatheter Q12H  . sodium chloride  3 mL Intravenous Q12H  . sodium chloride  3 mL Intravenous Q12H    Infusions: . sodium chloride Stopped (09/16/15 0830)  . amiodarone 30 mg/hr (09/19/15 2031)  . dextrose 10 mL/hr at  09/19/15 0800  . feeding supplement (VITAL AF 1.2 CAL) Stopped (09/19/15 0600)  . fentaNYL infusion INTRAVENOUS 400 mcg/hr (09/20/15 0631)  . midazolam (VERSED) infusion 5 mg/hr (09/19/15 2043)  . norepinephrine (LEVOPHED) Adult infusion Stopped (09/19/15 2300)    PRN Medications: sodium chloride, acetaminophen, albuterol, fentaNYL, midazolam, midazolam, midazolam, ondansetron (ZOFRAN) IV, pneumococcal 23 valent vaccine, sodium chloride, sodium chloride, sodium chloride   Assessment/Plan   NOU DEBOER is a 70 y.o. male with history of CAD s/p CABG x 4 2008, PAF, HTN, Ischemic cardiomyopathy Echo 03/2015 EF 45-50%, and dyslipidemia who [facility] 08/29/2015 after cardiac arrest. Emergent cath with severe CAD, EF 25%, and no culprit. Medical management. Remains on vent. HF team asked to see for second opinion regarding treatment of CAD/CHF (i.e. Advanced therapies) and recurrent SVT with attempts to wean vent.  1. Acute hypoxemic respiratory failure: Bilateral airspace disease, suspect aspiration post-arrest. Afebrile last night, respiratory cultures with Klebsiella.  - Antibiotic coverage with Ancef for Klebsiella PNA.  - Concerned that he will continue to be difficult to wean from vent.   - Family considering trach and peg.   2. CAD: Severe native vessel disease with LIMA-LAD as the only functional graft. Based on the appearance of the cath, his arrest may have been scar-mediated VF rather than primary plaque rupture/ACS. No interventional or redo CABG option.  - Continue ASA 81, atorvastatin.  3. Acute on chronic systolic CHF: LV-gram with EF 25%, echo 09/13/15 with EF 30-35%. Co-ox 75% off norepinephrine. CVP 4-5.  - Stop IV lasix and start lasix 40 mg twice a day. Cut back potassium to 40 meq daily.  - Continue digoxin, follow levels. -   - Off norepinephrine likely primarily for vasodilatory shock => will wean as able.  4. Atrial fibrillation: Patient is currently in atrial  fibrillation with rate improved in the 70s on amiodarone gtt.  - Continue amiodarone and digoxin for rate control. - Now off heparin gtt with recurrent hematuria.   - Patient went asystolic briefly with passing of TEE probe to esophagus.  Will not be doing TEE-guided DCCV.  Continue to control HR with amiodarone.  5. Hypernatremia: Sodium unchanged 150. Continue free water boluses. 6. Renal: Creatinine stable today with some rise in BUN.   7. ID: Afebrile with Klebsiella PNA.  On Ancef.  WBC down  to 16.   Amy Clegg NP-C 09/20/2015 6:59 AM   Patient seen  with NP, agree with the above note.  He will need trach/PEG at this point.  CVP 4 with good co-ox, off norepinephrine.  Transition to po Lasix.  If BP remains stable, will need transition off amiodarone gtt onto Toprol XL.   Loralie Champagne 09/20/2015 7:34 AM

## 2015-09-20 NOTE — Progress Notes (Signed)
ANTIBIOTIC CONSULT NOTE - INITIAL  Pharmacy Consult for Restart Vancomycin Indication: rule out bacteremia  No Known Allergies Patient Measurements: Height: 5\' 9"  (175.3 cm) Weight: 170 lb 10.2 oz (77.4 kg) IBW/kg (Calculated) : 70.7 Vital Signs: Temp: 97.9 F (36.6 C) (01/23 1609) Temp Source: Oral (01/23 1609) BP: 142/81 mmHg (01/23 1700) Pulse Rate: 67 (01/23 1700) Intake/Output from previous day: 01/22 0701 - 01/23 0700 In: 2935.1 [I.V.:1885.1; NG/GT:900; IV Piggyback:150] Out: 3450 [Urine:3450] Labs:  Recent Labs  09/18/15 0359 09/19/15 0400 09/20/15 0410  WBC 21.0* 25.5* 16.3*  HGB 8.6* 8.3* 7.5*  PLT 382 505* 311  CREATININE 1.20 1.15 1.24   Estimated Creatinine Clearance: 56.2 mL/min (by C-G formula based on Cr of 1.24).  Assessment: 70 year old male on day #5 Cefazolin for Klebsiella pnuemonia now with 1/2 blood cultures growing GPC in clusters to restart Vancomycin until culture is speciated. Last vancomycin dose was on 1/19. SCr is stable at 1.2 with estimated CrCl ~55 - 60 mL/min. The patient's WBC is coming down and currently he is afebrile.   Goal of Therapy:  Vancomycin trough level 15-20 mcg/ml  Plan:  Vancomycin 1250 mg IV x1 now, then 750 mg IV every 12 hours.  Monitor culture results, clinical status, and renal function.   Sloan Leiter, PharmD, BCPS Clinical Pharmacist [phone]  09/20/2015,6:26 PM

## 2015-09-20 NOTE — Consult Note (Signed)
NEURO HOSPITALIST CONSULT NOTE   Requestig physician: Dr. Titus Mould   Reason for Consult: Prognosis  HPI:                                                                                                                                          [facility] is an 70 y.o. male with history of CAD s/p CABG x 4 2008, PAF, HTN, Ischemic cardiomyopathy Echo 03/2015 EF 45-50%, and dyslipidemia followed by Dr Jerrye Beavers who presented to ER on 09/18/2015 after resuscitation from Cardiac arrest. He was shoveling snow and developed SOB then collapsed when EMS was called. CPR x 10-15 minutes with Vfib, 2 rounds epi and deibrillated to Afib. Cooling started on arrival to ER.   Went for emergent cath 09/07/2015 with severe native and bypass graft disease with no clear culprit for acute infarct. LIMA to LAD is patent and exhibits reduced flow. All other vessels totally occlude or high-grade stenosis (>95%). Vfib in cath lab x 2 with successful defib.  Intubated/Sedated 09/18/2015.Required levophed and vasopressin for pressor support and IV amio/heparin. Remains intubated with poor weaning trials.  Rewarmed 09/06/15.  While hospitalized suffered multiple periods of hypotension and at times had fever of 103.   Currently has Na 150, WBC 16.3  He is now 16 days post arrest and 14 days post warming. H remain on both versed and fentanyl  Past Medical History  Diagnosis Date  . Congestive cardiac failure ([place])   . Arthritis   . Hypertension   . Hyperlipidemia   . MI (myocardial infarction) [facility])     Past Surgical History  Procedure Laterality Date  . Coronary artery bypass graft  2008  . Cardiac catheterization N/A 09/18/2015    Procedure: Left Heart Cath and Cors/Grafts Angiography;  Surgeon: Belva Crome, MD;  Location: [place] [facility];  Service: Cardiovascular;  Laterality: N/A;  . Cardiac catheterization N/A 09/12/2015    Procedure: Coronary Balloon Angioplasty;  Surgeon: Belva Crome, MD;   Location: [place] [facility];  Service: Cardiovascular;  Laterality: N/A;    History reviewed. No pertinent family history.    Social History:  reports that he has quit smoking. His smoking use included Pipe. He started smoking about 30 years ago. He does not have any smokeless tobacco history on file. His alcohol and drug histories are not on file.  No Known Allergies  MEDICATIONS:  Prior to Admission:  Prescriptions prior to admission  Medication Sig Dispense Refill Last Dose  . aspirin EC 81 MG tablet Take 81 mg by mouth daily.    09/20/2015 at Unknown time  . atorvastatin (LIPITOR) 40 MG tablet Take 1 tablet (40 mg total) by mouth daily. 30 tablet 11 09/13/2015 at Unknown time  . lisinopril (PRINIVIL,ZESTRIL) 20 MG tablet Take 1 tablet (20 mg total) by mouth daily. 30 tablet 5 09/17/2015 at Unknown time  . metoprolol succinate (TOPROL-XL) 25 MG 24 hr tablet Take 25 mg by mouth daily.    09/08/2015 at betw 10 & 11  . naproxen sodium (ALEVE) 220 MG tablet Take 440-660 mg by mouth 2 (two) times daily as needed (pain).   unknown  . vitamin C (ASCORBIC ACID) 500 MG tablet Take 500 mg by mouth daily.   couple weeks ago  . HYDROcodone-homatropine (HYCODAN) 5-1.5 MG/5ML syrup Take 5 mLs by mouth every 6 (six) hours as needed for cough. (Patient not taking: Reported on 09/23/2015) 240 mL 0 Not Taking at Unknown time   Scheduled: . albuterol  2.5 mg Nebulization Q4H  . antiseptic oral rinse  7 mL Mouth Rinse 10 times per day  . aspirin  81 mg Oral Daily  . atorvastatin  80 mg Oral q1800  .  ceFAZolin (ANCEF) IV  2 g Intravenous Q8H  . chlorhexidine gluconate  15 mL Mouth Rinse BID  . digoxin  0.125 mg Per Tube Daily  . free water  300 mL Per Tube 6 times per day  . furosemide  40 mg Oral BID  . hydrocortisone  50 mg Per Tube 4 times per day  . insulin aspart  2-6 Units Subcutaneous 6  times per day  . insulin glargine  10 Units Subcutaneous Q24H  . lactulose  30 g Per Tube BID  . metoCLOPramide (REGLAN) injection  5 mg Intravenous 4 times per day  . pantoprazole sodium  40 mg Per Tube Daily  . polyethylene glycol  17 g Per Tube Daily  . senna-docusate  1 tablet Per Tube QHS  . sodium chloride  10-40 mL Intracatheter Q12H  . sodium chloride  3 mL Intravenous Q12H  . sodium chloride  3 mL Intravenous Q12H     ROS:                                                                                                                                       History obtained from unobtainable from patient due to mental status    Blood pressure 113/60, pulse 81, temperature 99.7 F (37.6 C), temperature source Oral, resp. rate 32, height 5\' 9"  (1.753 m), weight 77.4 kg (170 lb 10.2 oz), SpO2 98 %.   Neurologic Examination:  HEENT-  Normocephalic, no lesions, without obvious abnormality.  Normal external eye and conjunctiva.  Normal TM's bilaterally.  Normal auditory canals and external ears. Normal external nose, mucus membranes and septum.  Normal pharynx. Cardiovascular- S1, S2 normal, pulses palpable throughout   Lungs- chest clear, no wheezing, rales, normal symmetric air entry Abdomen- normal findings: bowel sounds normal Extremities- no edema Lymph-no adenopathy palpable Musculoskeletal-no joint tenderness, deformity or swelling Skin-warm and dry, no hyperpigmentation, vitiligo, or suspicious lesions  Neurological Examination Mental Status: Patient does not respond to verbal stimuli.  Does not respond to deep sternal rub.  Does not follow commands.  No verbalizations noted. Breathing over the vent Cranial Nerves: II: patient does not respond confrontation bilaterally, pupils right 2 mm, left 2 mm,and sluggishly reactive bilaterally III,IV,VI: doll's response present  bilaterally.  V,VII: corneal reflex present bilaterally  VIII: patient does not respond to verbal stimuli IX,X: gag reflex present, XI: trapezius strength unable to test bilaterally XII: tongue strength unable to test Motor: Extremities flaccid throughout.  No spontaneous movement noted.  No purposeful movements noted. Sensory: Does not respond to noxious stimuli in any extremity. Deep Tendon Reflexes:  1+ UE and KJ Plantars: absent bilaterally Cerebellar: Unable to perform      Lab Results: Basic Metabolic Panel:  Recent Labs Lab 09/14/15 0511 09/15/15 0444  09/16/15 0500  09/17/15 1045 09/17/15 1600 09/17/15 2240 09/18/15 0359 09/19/15 0400 09/20/15 0410  NA 151* 150*  < > 147*  < > 151* 148* 150* 153* 150* 150*  K 3.3* 2.8*  < > 3.0*  < > 3.4* 3.4* 4.4 3.7 4.1 4.0  CL 109 103  < > 107  < > 111 107 114* 115* 113* 110  CO2 33* 35*  < > 31  < > 29 29 28 28 27 28   GLUCOSE 189* 173*  < > 231*  < > 194* 247* 178* 172* 252* 175*  BUN 43* 46*  < > 49*  < > 47* 48* 50* 55* 56* 64*  CREATININE 1.36* 1.44*  < > 1.31*  < > 1.23 1.37* 1.28* 1.20 1.15 1.24  CALCIUM 7.3* 7.3*  < > 7.2*  < > 7.7* 7.4* 7.5* 7.6* 7.9* 7.7*  MG 2.2 2.2  --  2.4  --  3.2*  --  2.7* 2.8*  --  2.6*  PHOS 3.8 3.7  --  4.7*  --   --   --   --   --   --  3.3  < > = values in this interval not displayed.  Liver Function Tests:  Recent Labs Lab 09/19/15 0400  AST 26  ALT 30  ALKPHOS 66  BILITOT 1.0  PROT 5.6*  ALBUMIN 2.5*   No results for input(s): LIPASE, AMYLASE in the last 168 hours. No results for input(s): AMMONIA in the last 168 hours.  CBC:  Recent Labs Lab 09/17/15 0530 09/17/15 2240 09/18/15 0359 09/19/15 0400 09/20/15 0410  WBC 16.0* 18.6* 21.0* 25.5* 16.3*  HGB 7.8* 8.3* 8.6* 8.3* 7.5*  HCT 27.4* 27.9* 29.3* 28.5* 25.1*  MCV 87.5 85.8 86.9 88.0 86.9  PLT 337 343 382 505* 311    Cardiac Enzymes: No results for input(s): CKTOTAL, CKMB, CKMBINDEX, TROPONINI in the last  168 hours.  Lipid Panel:  Recent Labs Lab 09/15/15 1019 09/18/15 1331 09/18/15 1700 09/19/15 0400  TRIG 70 124 160* 259*    CBG:  Recent Labs Lab 09/19/15 1939 09/19/15 2325 09/20/15 0355 09/20/15 0755 09/20/15 1144  GLUCAP  [place]*    Microbiology: Results for orders placed or performed during the hospital encounter of 09/25/2015  Urine culture     Status: None   Collection Time: 09/19/2015  4:51 PM  Result Value Ref Range Status   Specimen Description URINE, CATHETERIZED  Final   Special Requests Normal  Final   Culture NO GROWTH 2 DAYS  Final   Report Status 09/06/2015 FINAL  Final  Culture, blood (routine x 2)     Status: None   Collection Time: 09/15/2015  6:55 PM  Result Value Ref Range Status   Specimen Description BLOOD LEFT HAND  Final   Special Requests BOTTLES DRAWN AEROBIC ONLY 5CC  Final   Culture NO GROWTH 5 DAYS  Final   Report Status 09/09/2015 FINAL  Final  Culture, blood (routine x 2)     Status: None   Collection Time: 09/06/2015  7:20 PM  Result Value Ref Range Status   Specimen Description BLOOD RIGHT HAND  Final   Special Requests IN PEDIATRIC BOTTLE 4CC  Final   Culture NO GROWTH 5 DAYS  Final   Report Status 09/09/2015 FINAL  Final  MRSA PCR Screening     Status: None   Collection Time: 09/09/2015 11:55 PM  Result Value Ref Range Status   MRSA by PCR NEGATIVE NEGATIVE Final    Comment:        The GeneXpert MRSA Assay (FDA approved for NASAL specimens only), is one component of a comprehensive MRSA colonization surveillance program. It is not intended to diagnose MRSA infection nor to guide or monitor treatment for MRSA infections.   Culture, Urine     Status: None   Collection Time: 09/13/15 12:34 AM  Result Value Ref Range Status   Specimen Description URINE, CATHETERIZED  Final   Special Requests NONE  Final   Culture NO GROWTH 1 DAY  Final   Report Status 09/14/2015 FINAL  Final  Culture, respiratory  (NON-Expectorated)     Status: None   Collection Time: 09/13/15 12:47 AM  Result Value Ref Range Status   Specimen Description TRACHEAL ASPIRATE  Final   Special Requests NONE  Final   Gram Stain   Final    ABUNDANT WBC PRESENT,BOTH PMN AND MONONUCLEAR FEW SQUAMOUS EPITHELIAL CELLS PRESENT FEW YEAST Performed at [facility]    Culture   Final    MODERATE KLEBSIELLA PNEUMONIAE Performed at [facility]    Report Status 09/16/2015 FINAL  Final   Organism ID, Bacteria KLEBSIELLA PNEUMONIAE  Final      Susceptibility   Klebsiella pneumoniae - MIC*    AMPICILLIN RESISTANT      AMPICILLIN/SULBACTAM 8 SENSITIVE Sensitive     CEFEPIME <=1 SENSITIVE Sensitive     CEFTAZIDIME <=1 SENSITIVE Sensitive     CEFTRIAXONE <=1 SENSITIVE Sensitive     CIPROFLOXACIN <=0.25 SENSITIVE Sensitive     GENTAMICIN <=1 SENSITIVE Sensitive     IMIPENEM 0.5 SENSITIVE Sensitive     PIP/TAZO 8 SENSITIVE Sensitive     TOBRAMYCIN <=1 SENSITIVE Sensitive     TRIMETH/SULFA Value in next row Sensitive      <=20 SENSITIVE(NOTE)    * MODERATE KLEBSIELLA PNEUMONIAE  Culture, blood (routine x 2)     Status: None   Collection Time: 09/13/15 12:50 AM  Result Value Ref Range Status   Specimen Description BLOOD RIGHT HAND  Final   Special Requests BOTTLES DRAWN AEROBIC ONLY 2.5CC  Final   Culture  NO GROWTH 5 DAYS  Final   Report Status 09/18/2015 FINAL  Final  Culture, blood (routine x 2)     Status: None   Collection Time: 09/13/15 12:59 AM  Result Value Ref Range Status   Specimen Description BLOOD LEFT ARM  Final   Special Requests BOTTLES DRAWN AEROBIC ONLY 5CC  Final   Culture NO GROWTH 5 DAYS  Final   Report Status 09/18/2015 FINAL  Final    Coagulation Studies: No results for input(s): LABPROT, INR in the last 72 hours.  Imaging: Dg Chest Port 1 View  09/20/2015  CLINICAL DATA:  Hypoxia EXAM: PORTABLE CHEST 1 VIEW COMPARISON:  September 19, 2015 FINDINGS: Endotracheal tube tip is 4.2 cm  above the carina. Nasogastric tube and feeding tube tips are below the diaphragm. Central catheter tip is in the superior vena cava. No pneumothorax. There is moderate interstitial edema bilaterally. There is patchy alveolar opacity in the bases, likely alveolar edema. Heart is mildly enlarged with pulmonary venous hypertension. IMPRESSION: Tube and catheter positions as described without pneumothorax. Evidence a degree of congestive heart failure. There may be slightly more alveolar opacity, likely alveolar edema, in the lung bases. It should be noted that superimposed pneumonia in the bases cannot be excluded radiographically. Electronically Signed   By: Lowella Grip III M.D.   On: 09/20/2015 07:06   Dg Chest Port 1 View  09/19/2015  CLINICAL DATA:  Intubation. EXAM: PORTABLE CHEST 1 VIEW COMPARISON:  09/18/2015 FINDINGS: Endotracheal tube is 2 cm superior to the carina. Enteric catheter and feeding catheter are collimated off the image. Left internal jugular approach central venous catheter are stable. Postsurgical changes from median sternotomy and CABG are stable. The cardiac silhouette is enlarged. Mediastinal contours appear intact. There is no evidence of focal airspace consolidation, pleural effusion or pneumothorax. There are coarse interstitial markings. Unchanged patchy scattered bilateral airspace consolidation. Osseous structures are without acute abnormality. Soft tissues are grossly normal. IMPRESSION: Interval apparent advancement of the endotracheal tube, which terminates 2 cm from the carina. Otherwise stable support apparatus. Cardiomegaly. Stable diffuse bilateral patchy airspace consolidation and chronic interstitial lung changes. Electronically Signed   By: Fidela Salisbury M.D.   On: 09/19/2015 15:37   Dg Abd Portable 1v  09/19/2015  CLINICAL DATA:  Not tolerating tube feeds. EXAM: PORTABLE ABDOMEN - 1 VIEW COMPARISON:  09/02/2015 FINDINGS: The stomach is distended. Enteric  catheter is seen within proximal stomach, side hole slightly past the expected location of gastroesophageal junction. Feeding catheter is also seen, tip at the expected location of the distal gastric body. There is relative paucity of small bowel gas. The colonic bowel gas pattern is normal. No evidence of free intra-abdominal fluid on this supine radiograph. A rectal catheter is visualized. IMPRESSION: Distended stomach with enteric and feeding catheters within the proximal and distal gastric body respectively. Relative paucity of small bowel gas. Normal colonic bowel gas pattern. Electronically Signed   By: Fidela Salisbury M.D.   On: 09/19/2015 15:41       Assessment and plan per attending neurologist  Etta Quill PA-C [facility] [phone]  09/20/2015, 2:28 PM   Assessment/Plan:

## 2015-09-21 ENCOUNTER — Inpatient Hospital Stay (HOSPITAL_COMMUNITY): Payer: Medicare Other

## 2015-09-21 DIAGNOSIS — I6330 Cerebral infarction due to thrombosis of unspecified cerebral artery: Secondary | ICD-10-CM | POA: Insufficient documentation

## 2015-09-21 DIAGNOSIS — G931 Anoxic brain damage, not elsewhere classified: Secondary | ICD-10-CM

## 2015-09-21 DIAGNOSIS — J930 Spontaneous tension pneumothorax: Secondary | ICD-10-CM

## 2015-09-21 LAB — GLUCOSE, CAPILLARY
GLUCOSE-CAPILLARY: 266 mg/dL — AB (ref 65–99)
GLUCOSE-CAPILLARY: 274 mg/dL — AB (ref 65–99)
Glucose-Capillary: 266 mg/dL — ABNORMAL HIGH (ref 65–99)
Glucose-Capillary: 288 mg/dL — ABNORMAL HIGH (ref 65–99)
Glucose-Capillary: 252 mg/dL — ABNORMAL HIGH (ref 65–99)
Glucose-Capillary: 247 mg/dL — ABNORMAL HIGH (ref 65–99)
Glucose-Capillary: 206 mg/dL — ABNORMAL HIGH (ref 65–99)
Glucose-Capillary: 165 mg/dL — ABNORMAL HIGH (ref 65–99)
Glucose-Capillary: 150 mg/dL — ABNORMAL HIGH (ref 65–99)

## 2015-09-21 LAB — COMPREHENSIVE METABOLIC PANEL
ALT: 20 U/L (ref 17–63)
AST: 38 U/L (ref 15–41)
Albumin: 2.6 g/dL — ABNORMAL LOW (ref 3.5–5.0)
Alkaline Phosphatase: 74 U/L (ref 38–126)
Anion gap: 11 (ref 5–15)
BILIRUBIN TOTAL: 0.8 mg/dL (ref 0.3–1.2)
BUN: 73 mg/dL — AB (ref 6–20)
CALCIUM: 7.7 mg/dL — AB (ref 8.9–10.3)
CHLORIDE: 109 mmol/L (ref 101–111)
CO2: 29 mmol/L (ref 22–32)
CREATININE: 1.79 mg/dL — AB (ref 0.61–1.24)
GFR, EST AFRICAN AMERICAN: 43 mL/min — AB (ref >60)
GFR, EST NON AFRICAN AMERICAN: 37 mL/min — AB (ref >60)
Glucose, Bld: 319 mg/dL — ABNORMAL HIGH (ref 65–99)
Potassium: 4.7 mmol/L (ref 3.5–5.1)
Sodium: 149 mmol/L — ABNORMAL HIGH (ref 135–145)
TOTAL PROTEIN: 5.6 g/dL — AB (ref 6.5–8.1)

## 2015-09-21 LAB — CBC
HCT: 28.7 % — ABNORMAL LOW (ref 39.0–52.0)
Hemoglobin: 7.9 g/dL — ABNORMAL LOW (ref 13.0–17.0)
MCH: 24.8 pg — ABNORMAL LOW (ref 26.0–34.0)
MCHC: 27.5 g/dL — ABNORMAL LOW (ref 30.0–36.0)
MCV: 90.3 fL (ref 78.0–100.0)
PLATELETS: 551 10*3/uL — AB (ref 150–400)
RBC: 3.18 MIL/uL — AB (ref 4.22–5.81)
RDW: 20.1 % — AB (ref 11.5–15.5)
WBC: 32.2 10*3/uL — AB (ref 4.0–10.5)

## 2015-09-21 LAB — SEDIMENTATION RATE: SED RATE: 31 mm/h — AB (ref 0–16)

## 2015-09-21 LAB — CULTURE, BLOOD (ROUTINE X 2)

## 2015-09-21 LAB — PHOSPHORUS: PHOSPHORUS: 9.7 mg/dL — AB (ref 2.5–4.6)

## 2015-09-21 LAB — MAGNESIUM: MAGNESIUM: 3 mg/dL — AB (ref 1.7–2.4)

## 2015-09-21 LAB — PROCALCITONIN: PROCALCITONIN: 1.53 ng/mL

## 2015-09-21 MED ORDER — MORPHINE SULFATE 25 MG/ML IV SOLN
10.0000 mg/h | INTRAVENOUS | Status: DC
  Administered 2015-09-21: 10 mg/h via INTRAVENOUS
  Filled 2015-09-21: qty 10

## 2015-09-21 MED ORDER — INSULIN ASPART 100 UNIT/ML ~~LOC~~ SOLN
10.0000 [IU] | Freq: Once | SUBCUTANEOUS | Status: AC
  Administered 2015-09-21: 10 [IU] via INTRAVENOUS

## 2015-09-21 MED ORDER — MIDAZOLAM BOLUS VIA INFUSION
5.0000 mg | INTRAVENOUS | Status: DC | PRN
  Filled 2015-09-21: qty 20

## 2015-09-21 MED ORDER — LIDOCAINE HCL (PF) 1 % IJ SOLN
INTRAMUSCULAR | Status: AC
  Administered 2015-09-21: 10 mL
  Filled 2015-09-21: qty 5

## 2015-09-21 MED ORDER — SCOPOLAMINE 1 MG/3DAYS TD PT72
1.0000 | MEDICATED_PATCH | TRANSDERMAL | Status: DC
  Administered 2015-09-21: 1.5 mg via TRANSDERMAL
  Filled 2015-09-21: qty 1

## 2015-09-21 MED ORDER — SODIUM CHLORIDE 0.9 % IV SOLN
INTRAVENOUS | Status: DC
  Administered 2015-09-21: 1.9 [IU]/h via INTRAVENOUS
  Filled 2015-09-21: qty 2.5

## 2015-09-21 MED ORDER — FUROSEMIDE 10 MG/ML IJ SOLN
40.0000 mg | Freq: Two times a day (BID) | INTRAMUSCULAR | Status: DC
  Administered 2015-09-21: 40 mg via INTRAVENOUS
  Filled 2015-09-21: qty 4

## 2015-09-21 MED ORDER — HYDRALAZINE HCL 20 MG/ML IJ SOLN: 10.0000 mg | INTRAMUSCULAR | Status: DC | PRN

## 2015-09-21 MED ORDER — MORPHINE BOLUS VIA INFUSION
5.0000 mg | INTRAVENOUS | Status: DC | PRN
  Filled 2015-09-21: qty 20

## 2015-09-21 MED ORDER — SODIUM CHLORIDE 0.9 % IV SOLN
10.0000 mg/h | INTRAVENOUS | Status: DC
  Administered 2015-09-21: 2 mg/h via INTRAVENOUS
  Filled 2015-09-21: qty 10

## 2015-09-23 ENCOUNTER — Telehealth: Payer: Self-pay

## 2015-09-23 NOTE — Telephone Encounter (Signed)
On 09/23/2015 I received a death certificate from [place] (original). The death certificate is for burial. The patient is a patient of Doctor Titus Mould. The death certificate will be taken to [facility]) this pm for signature. On Sep 30, 2015 I received the death certificate back from Doctor Titus Mould.  I got the death certificate ready and called the funeral home to let them know the death certificate is ready for pickup.

## 2015-09-24 LAB — CULTURE, BLOOD (ROUTINE X 2): Culture: NO GROWTH

## 2015-09-29 NOTE — Progress Notes (Signed)
Radiology called regarding KUB and increase in air on xray. I paged e-link with update and stat chest xray to be performed. Will continue to monitor.

## 2015-09-29 NOTE — Progress Notes (Signed)
[place] Progress Note Patient Name: Patrick [facility] DOB: [birth year] MRN: [id number]   Date of Service  10/11/15  HPI/Events of Note  Patient "guppy breathing" and difficult to bag. Suspect mucous plugging. Respiratory Therapy in the process of lavaging and bagging.   eICU Interventions  Will do portable CXR ordered for 5 AM now STAT.      Intervention Category Intermediate Interventions: Respiratory distress - evaluation and management  Noemi Ishmael Eugene 10/11/2015, 2:24 AM

## 2015-09-29 NOTE — Progress Notes (Signed)
Before transporting the patient to MRI for a brain scan this RT noticed that the patient has a NAVA cable connected to the vent. Per MRI protocol the NAVA cable is not compatible with MRI. After discussion with Dr Ronnette Juniper in the box I was instructed to remove the NAVA cable for the procedure.

## 2015-09-29 NOTE — Progress Notes (Signed)
Nutrition Brief Note  Chart reviewed. Pt now transitioning to comfort care.  No further nutrition interventions warranted at this time.  Please re-consult as needed.   Marjory Meints RD, LDN, CNSC [phone] Pager [phone] After Hours Pager    

## 2015-09-29 NOTE — Progress Notes (Signed)
Tidal volume decreased due to increase PIP.  Dr. Oletta Darter notified via e-link.  Chest tube placed and RT will continue to monitor vent in order to return to previous vent settings.

## 2015-09-29 NOTE — Progress Notes (Signed)
Patient ID: Patrick Sexton [facility], male   DOB: 1946/08/23, 70 y.o.   MRN: [id number]  Advanced Heart Failure Consult Note  Referring MD: Dr Elsworth Soho PCP: Carmon Ginsberg, PA Primary Cardiologist: Dr Clayborn Bigness  Subjective:    SIGNIFICANT EVENTS: 01/07 cardiac arrest/intubated/sedated/emergent cath 01/08 shock 01/09 rewarm 01/10 follows commands 01/11 dnr established, [facility] signed off 01/16 fevers overnight, BCx sent. HF team asked to see. Fever spike 103, UA sent. 01/20  attempted TEE-guided DCCV.  Patient was sedated with 2 mg IV Versed in addition to his Precedex and Fentanyl.  Upon passing TEE probe into esophagus, he became asystolic and was immediately paced externally.  He also received 1 mg IV atropine.  He recovered pulse promptly with pacing and had a native perfusing rhythm again quickly.  He remained in atrial fibrillation.  Amiodarone gtt initially held but then restarted when HR went > 100 bpm.  BP stable.  1/23 Blood culture gram + cocci in clusters.   1/24 WBC 32.  CVP rising again. L sided tension PTX, CT inserted. MRI head- 3 total ischemic infarcts.  STUDIES:  CT head 1/7>> mild atrophy LHC 1/7>> no intervention but severe LV dysfunction and severe native and graft disease CXR 1/9>> EEG 1/9>>Abnormal with moderate generalized nonspecific continuous slowing of cerebral activity, no epileptic disorder Echo 09/13/15>> LVEF 30-35%, mildly reduce RV, mild MR, mod TR, PA peak pressure 60 mm Hg      Objective:   Weight Range: 173 lb 1 oz (78.5 kg) Body mass index is 25.54 kg/(m^2).   Vital Signs:   Temp:  [97.9 F (36.6 C)-99.7 F (37.6 C)] 98.2 F (36.8 C) (01/24 0400) Pulse Rate:  [64-114] 112 (01/24 0615) Resp:  [17-32] 17 (01/24 0700) BP: (92-155)/(15-117) 119/65 mmHg (01/24 0700) SpO2:  [90 %-100 %] 100 % (01/24 0615) FiO2 (%):  [30 %-100 %] 100 % (01/24 0400) Weight:  [173 lb 1 oz (78.5 kg)] 173 lb 1 oz (78.5 kg) (01/24 0500) Last BM Date: 09/20/15  Weight  change: Filed Weights   09/19/15 0400 09/20/15 0300 10-04-15 0500  Weight: 171 lb 8.3 oz (77.8 kg) 170 lb 10.2 oz (77.4 kg) 173 lb 1 oz (78.5 kg)    Intake/Output:   Intake/Output Summary (Last 24 hours) at 10-04-15 0705 Last data filed at 10-04-15 0700  Gross per 24 hour  Intake 2858.86 ml  Output   1649 ml  Net 1209.86 ml     Physical Exam: CVP: 12  General: Chronically ill appearing, intubated and sedated. HEENT: Remains on vent.  Neck: supple. JVP appears elevated . Carotids 2+ bilat; no bruits. No thyromegaly or nodule noted.LIJ  Cor: PMI nondisplaced. Irregularly irregular. Lungs: Mechanical breath sounds. L chest tube  Abdomen: soft, non-distended, no HSM. No bruits or masses. +BS  Extremities: no cyanosis, clubbing, rash. No edema. R and LLE SCDs.   Neuro: Intubated, sedated  GU: Foley    Telemetry: Reviewed personally, Afib 90-110s  with LBBB  Labs: CBC  Recent Labs  09/20/15 0410 Oct 04, 2015 0459  WBC 16.3* 32.2*  HGB 7.5* 7.9*  HCT 25.1* 28.7*  MCV 86.9 90.3  PLT 311 [id number]*   Basic Metabolic Panel  Recent Labs  09/20/15 0410 10-04-2015 0459  NA 150* 149*  K 4.0 4.7  CL 110 109  CO2 28 29  GLUCOSE 175* 319*  BUN 64* 73*  CREATININE 1.24 1.79*  CALCIUM 7.7* 7.7*  MG 2.6* 3.0*  PHOS 3.3 9.7*   Liver Function Tests  Recent Labs  09/19/15 0400 10-20-2015 0459  AST 26 38  ALT 30 20  ALKPHOS 66 74  BILITOT 1.0 0.8  PROT 5.6* 5.6*  ALBUMIN 2.5* 2.6*   No results for input(s): LIPASE, AMYLASE in the last 72 hours. Cardiac Enzymes No results for input(s): CKTOTAL, CKMB, CKMBINDEX, TROPONINI in the last 72 hours.  BNP: BNP (last 3 results)  Recent Labs  09/09/2015 1520 09/07/15 1057  BNP 235.5* 670.0*    ProBNP (last 3 results) No results for input(s): PROBNP in the last 8760 hours.   D-Dimer No results for input(s): DDIMER in the last 72 hours. Hemoglobin A1C No results for input(s): HGBA1C in the last 72 hours. Fasting Lipid  Panel  Recent Labs  09/19/15 0400  TRIG 259*   Thyroid Function Tests No results for input(s): TSH, T4TOTAL, T3FREE, THYROIDAB in the last 72 hours.  Invalid input(s): FREET3  Other results:     Imaging/Studies:  Mr Brain Wo Contrast  10/20/2015  CLINICAL DATA:  Initial evaluation for anoxic brain injury. Status post cardiac arrest. EXAM: MRI HEAD WITHOUT CONTRAST TECHNIQUE: Multiplanar, multiecho pulse sequences of the brain and surrounding structures were obtained without intravenous contrast. COMPARISON:  Prior CT from 09/03/2014. FINDINGS: Study somewhat degraded by motion artifact. Diffuse prominence of the CSF containing spaces is compatible with generalized age-related cerebral atrophy. Patchy T2/FLAIR hyperintensity within the periventricular and deep white matter both cerebral hemispheres present, most consistent with chronic small vessel ischemic disease, mild in nature. There are 3 total ischemic infarcts. Largest cyst position within the right centrum semi ovale and measures 12 mm (series 7, image 17). Additional 10 mm infarct within the posterior left centrum semi ovale in the left parietal lobe (series 7, image 10). Additional small cortical infarct within the left occipital pole measures 7 mm (series 3, image 22). No significant mass effect with these infarcts. No associated hemorrhage. No other evidence for cerebral anoxia. Major intracranial vascular flow voids are maintained. No acute or chronic intracranial hemorrhage. No mass lesion, midline shift, or mass effect. No hydrocephalus. No extra-axial fluid collection. Craniocervical junction within normal limits. Pituitary gland normal. No acute abnormality about the orbits. Axial myopia noted. Paranasal sinuses are largely clear. Bilateral mastoid effusions noted. Postoperative changes from prior left frontoparietal craniectomy. No acute abnormality about the scalp soft tissues. Bone marrow signal intensity within normal limits.  IMPRESSION: 1. Three total small acute ischemic nonhemorrhagic infarcts involving the bilateral centrum semi ovale as well as the left occipital pole as above. No significant mass effect. 2. No other MRI evidence for global cerebral anoxia. 3. Mild chronic small vessel ischemic disease. 4. Bilateral mastoid effusions. Electronically Signed   By: Jeannine Boga M.D.   On: October 20, 2015 05:05   Dg Chest Port 1 View  October 20, 2015  CLINICAL DATA:  Acute onset of worsening respiratory distress. Initial encounter. EXAM: PORTABLE CHEST 1 VIEW COMPARISON:  Chest radiograph performed earlier today at 3:21 a.m. FINDINGS: The patient's endotracheal tube is seen ending 2 cm above the carina. An enteric tube is noted extending below the diaphragm. The left IJ line is noted ending overlying the mid SVC. The left-sided chest tube is unchanged in appearance. No pneumothorax is seen. However there is new soft tissue air along the right chest wall, reflecting air tracking across the abdominal wall, and new air along the left side of the neck. There is also increased pneumomediastinum, likely tracking inferiorly from the neck. Patchy bilateral airspace opacification is again noted, concerning for pneumonia. Underlying interstitial edema  is a concern. No definite pleural effusion is identified. The cardiomediastinal silhouette is mildly enlarged. The patient is status post median sternotomy, with evidence of prior CABG. No acute osseous abnormalities are seen. IMPRESSION: 1. New soft tissue air along the right chest wall, reflecting air tracking across the abdominal wall, and new air along the left side of the neck. Increased pneumomediastinum likely tracks inferiorly from the neck. This reflects the patient's left-sided chest tube. Would correlate clinically. 2. Patchy bilateral airspace opacification again noted, concerning for pneumonia. Underlying interstitial edema is a concern. Mild cardiomegaly. These results were called by  telephone at the time of interpretation on 30-Sep-2015 at 5:12 am to [facility] on [facility], who verbally acknowledged these results. Electronically Signed   By: Garald Balding M.D.   On: 09/30/15 05:14   Dg Chest Port 1 View  30-Sep-2015  CLINICAL DATA:  Status post left-sided chest tube placement. Initial encounter. EXAM: PORTABLE CHEST 1 VIEW COMPARISON:  Chest radiograph performed earlier today at 2:35 a.m. FINDINGS: There has been interval placement of a left apical chest tube, with re-expansion of the left lung. No definite residual pneumothorax is seen. The patient's endotracheal tube is seen ending 3 cm above the carina. An enteric tube is noted ending at the mid esophagus; this could be advanced at least 20 cm. A left IJ line is noted ending about the mid SVC. Patchy bilateral airspace opacification raises concern for pneumonia, given the the more focal appearance at the right lung base. Underlying vascular congestion is noted. Mild interstitial edema cannot be excluded. There appears to be some degree of pneumomediastinum, stable in appearance and likely reflecting extension from the recent pneumothorax. The cardiomediastinal silhouette is mildly enlarged. The patient is status post median sternotomy, with evidence of prior CABG. No acute osseous abnormalities are seen. Soft tissue air is noted along the left chest wall. IMPRESSION: 1. Interval placement of left apical chest tube, with re-expansion of the left lung. No definite residual pneumothorax seen. 2. Enteric tube noted ending at the mid esophagus. This could be advanced at least 20 cm. 3. Patchy bilateral airspace opacification raises concern for pneumonia. Underlying vascular congestion noted. Mild interstitial edema cannot be excluded. 4. Underlying pneumomediastinum is stable and likely reflects extension from the prior pneumothorax. 5. Mild cardiomegaly noted. Electronically Signed   By: Garald Balding M.D.   On: 09-30-2015 03:33   Dg Chest  Port 1 View  Sep 30, 2015  CLINICAL DATA:  Endotracheal tube.  Shortness of breath. EXAM: PORTABLE CHEST 1 VIEW COMPARISON:  09/20/2015 FINDINGS: Postoperative changes in the mediastinum. Endotracheal tube tip measures 3.7 cm above the carina. Enteric tube tip is demonstrated in the mid/lower esophagus. Advancement is suggested. Left central venous catheter with tip over the low SVC region. Since the previous study, there is interval development of a left tension pneumothorax with complete collapse of the left lung. There is diffuse airspace infiltration throughout the right lung. No blunting of costophrenic angles. Heart size is normal. IMPRESSION: New development of a tension left pneumothorax. The left lung is collapsed. Diffuse infiltration in the right lung. Endotracheal tube and left central venous catheter appear in satisfactory position. NG tube tip is at the level of the low/mid esophagus and needs to be advanced. These results were called by telephone at the time of interpretation on 2015-09-30 at 2:46 am to [facility] on [facility] heart vascular, who verbally acknowledged these results. Electronically Signed   By: Lucienne Capers M.D.   On: September 30, 2015  02:47   Dg Chest Port 1 View  09/20/2015  CLINICAL DATA:  Hypoxia EXAM: PORTABLE CHEST 1 VIEW COMPARISON:  September 19, 2015 FINDINGS: Endotracheal tube tip is 4.2 cm above the carina. Nasogastric tube and feeding tube tips are below the diaphragm. Central catheter tip is in the superior vena cava. No pneumothorax. There is moderate interstitial edema bilaterally. There is patchy alveolar opacity in the bases, likely alveolar edema. Heart is mildly enlarged with pulmonary venous hypertension. IMPRESSION: Tube and catheter positions as described without pneumothorax. Evidence a degree of congestive heart failure. There may be slightly more alveolar opacity, likely alveolar edema, in the lung bases. It should be noted that superimposed pneumonia in the bases cannot be  excluded radiographically. Electronically Signed   By: Lowella Grip III M.D.   On: 09/20/2015 07:06   Dg Chest Port 1 View  09/19/2015  CLINICAL DATA:  Intubation. EXAM: PORTABLE CHEST 1 VIEW COMPARISON:  09/18/2015 FINDINGS: Endotracheal tube is 2 cm superior to the carina. Enteric catheter and feeding catheter are collimated off the image. Left internal jugular approach central venous catheter are stable. Postsurgical changes from median sternotomy and CABG are stable. The cardiac silhouette is enlarged. Mediastinal contours appear intact. There is no evidence of focal airspace consolidation, pleural effusion or pneumothorax. There are coarse interstitial markings. Unchanged patchy scattered bilateral airspace consolidation. Osseous structures are without acute abnormality. Soft tissues are grossly normal. IMPRESSION: Interval apparent advancement of the endotracheal tube, which terminates 2 cm from the carina. Otherwise stable support apparatus. Cardiomegaly. Stable diffuse bilateral patchy airspace consolidation and chronic interstitial lung changes. Electronically Signed   By: Fidela Salisbury M.D.   On: 09/19/2015 15:37   Dg Abd Portable 1v  10/18/2015  CLINICAL DATA:  Orogastric tube placement.  Initial encounter. EXAM: PORTABLE ABDOMEN - 1 VIEW COMPARISON:  Abdominal radiograph performed 09/19/2015, and chest radiograph performed earlier today at 3:21 a.m. FINDINGS: The patient's enteric tube is noted ending overlying the body of the stomach. The visualized bowel gas pattern is nonspecific, with improved distention of the stomach. Prominent soft tissue air is noted dissecting along the left lateral abdominal wall, status post left-sided chest tube placement. Underlying pneumomediastinum is again noted. This may be more prominent than on the recent prior chest radiograph. IMPRESSION: 1. Enteric tube noted ending overlying the body of the stomach. 2. Prominent soft tissue air dissecting along the  left lateral abdominal wall, status post left-sided chest tube placement. Underlying pneumomediastinum appears somewhat more prominent. This raises concern for underlying air leak. Would correlate clinically. These results were called by telephone at the time of interpretation on 10/18/2015 at 4:32 am to [facility] on [facility], who verbally acknowledged these results. Electronically Signed   By: Garald Balding M.D.   On: Oct 18, 2015 04:32   Dg Abd Portable 1v  09/19/2015  CLINICAL DATA:  Not tolerating tube feeds. EXAM: PORTABLE ABDOMEN - 1 VIEW COMPARISON:  09/02/2015 FINDINGS: The stomach is distended. Enteric catheter is seen within proximal stomach, side hole slightly past the expected location of gastroesophageal junction. Feeding catheter is also seen, tip at the expected location of the distal gastric body. There is relative paucity of small bowel gas. The colonic bowel gas pattern is normal. No evidence of free intra-abdominal fluid on this supine radiograph. A rectal catheter is visualized. IMPRESSION: Distended stomach with enteric and feeding catheters within the proximal and distal gastric body respectively. Relative paucity of small bowel gas. Normal colonic bowel gas pattern. Electronically Signed  By: Fidela Salisbury M.D.   On: 09/19/2015 15:41    Latest Echo  Latest Cath   Medications:     Scheduled Medications: . albuterol  2.5 mg Nebulization Q4H  . antiseptic oral rinse  7 mL Mouth Rinse 10 times per day  . aspirin  81 mg Oral Daily  . atorvastatin  80 mg Oral q1800  .  ceFAZolin (ANCEF) IV  2 g Intravenous Q8H  . chlorhexidine gluconate  15 mL Mouth Rinse BID  . digoxin  0.125 mg Per Tube Daily  . free water  300 mL Per Tube 6 times per day  . furosemide  40 mg Oral BID  . hydrocortisone  50 mg Per Tube 4 times per day  . insulin aspart  2-6 Units Subcutaneous 6 times per day  . insulin glargine  10 Units Subcutaneous Q24H  . lactulose  30 g Per Tube BID  .  metoCLOPramide (REGLAN) injection  5 mg Intravenous 4 times per day  . pantoprazole sodium  40 mg Per Tube Daily  . polyethylene glycol  17 g Per Tube Daily  . senna-docusate  1 tablet Per Tube QHS  . sodium chloride  10-40 mL Intracatheter Q12H  . sodium chloride  3 mL Intravenous Q12H  . sodium chloride  3 mL Intravenous Q12H  . vancomycin  750 mg Intravenous Q12H    Infusions: . sodium chloride Stopped (09/16/15 0830)  . amiodarone 30 mg/hr (10-15-2015 0617)  . dextrose 10 mL/hr at 09/20/15 1900  . feeding supplement (VITAL AF 1.2 CAL) 1,000 mL (09/20/15 2131)  . fentaNYL infusion INTRAVENOUS 200 mcg/hr (09/20/15 1900)  . insulin (NOVOLIN-R) infusion 1.9 Units/hr (10-15-15 [id number])  . midazolam (VERSED) infusion 4 mg/hr (09/20/15 2216)  . norepinephrine (LEVOPHED) Adult infusion Stopped (09/19/15 2300)    PRN Medications: sodium chloride, acetaminophen, albuterol, fentaNYL, midazolam, midazolam, midazolam, ondansetron (ZOFRAN) IV, pneumococcal 23 valent vaccine, sodium chloride, sodium chloride, sodium chloride   Assessment/Plan   CANDON DISCHLER is a 70 y.o. male with history of CAD s/p CABG x 4 2008, PAF, HTN, Ischemic cardiomyopathy Echo 03/2015 EF 45-50%, and dyslipidemia who presented to [facility] 09/20/2015 after cardiac arrest. Emergent cath with severe CAD, EF 25%, and no culprit. Medical management. Remains on vent. HF team asked to see for second opinion regarding treatment of CAD/CHF (i.e. Advanced therapies) and recurrent SVT with attempts to wean vent.  1. Acute hypoxemic respiratory failure: Bilateral airspace disease, suspect aspiration post-arrest. Afebrile last night, respiratory cultures with Klebsiella.  Tension PTX yesterday, now has chest tube. - Antibiotic coverage with Ancef for Klebsiella PNA.  - Blood cultures with gram + cocci in clusters. Vanc started 1/23.   2. CAD: Severe native vessel disease with LIMA-LAD as the only functional graft. Based on the appearance of the  cath, his arrest may have been scar-mediated VF rather than primary plaque rupture/ACS. No interventional or redo CABG option.  - Continue ASA 81, atorvastatin.  3. Acute on chronic systolic CHF: LV-gram with EF 25%, echo 09/13/15 with EF 30-35%. Co-ox pending. Off norepi; CVP 11-12  - Stop po lasix and start lasix 40 mg IV twice a day. Continue potassium to 40 meq daily.  - Continue digoxin, follow levels.    - Off norepinephrine, likely primarily for vasodilatory shock   4. Atrial fibrillation: Patient is currently in atrial fibrillation with rate in 100s on amiodarone gtt.  - Continue amiodarone and digoxin for rate control. Eventual transition to Toprol XL.  -  Now off heparin gtt with recurrent hematuria.   - Patient went asystolic briefly with passing of TEE probe to esophagus.  Will not be doing TEE-guided DCCV.  Continue to control HR with amiodarone.  5. Hypernatremia: Sodium unchanged 149. Continue free water boluses. 6. Renal: Creatinine stable today with some rise in BUN.   7. ID: Afebrile with Klebsiella PNA and gram + cocci on most recent blood cultures.  On Ancef and vancomycin.  WBC up 16>32.  8. L pneumothorax: CT inserted this am.  9. Neuro: CVAs noted on MRI head.  10. Poor prognosis.   Amy Clegg NP-C 09-23-2015 7:05 AM   Patient seen with NP, agree with the above note.  Events of day so far noted, CVAs on MRI and tension PTX requiring chest tube.  Very poor prognosis at this point, would recommend proceeding to comfort care if family in agreement.   Loralie Champagne 23-Sep-2015 7:48 AM

## 2015-09-29 NOTE — Discharge Summary (Signed)
[name], Patrick Sexton NO.:  [id number]  MEDICAL RECORD NO.:  [id number]  LOCATION:  2H06C                        FACILITY:  [facility]  PHYSICIAN:  Raylene Miyamoto, MD DATE OF BIRTH:  06/06/1946  DATE OF ADMISSION:  09/02/2015 DATE OF DISCHARGE:  09/30/15                              DISCHARGE SUMMARY   DEATH SUMMARY:  This is an unfortunate circumstance of a 70 year old male, who was out shoveling snow with known coronary artery disease and developed a VFib arrest and was taken to the emergency room at the [facility] and underwent VFib arrest again in the cath lab for which there were no options in terms of his coronary artery disease. Hypothermia protocol was initiated.  His hospital course was long lengthy starting on September 04, 2015, with the VFib arrest described before.  He had a CT scan of the head also, with just mild atrophy and negative for any acute changes.  Was in shock on the 8th, was re-warmed on the 9th, had an EEG with generalized slowing, but no seizure activity on the 9th.  He followed commands on the 10th.  DNR was established on the 11th, given his limited options of any kind of therapy for his coronary artery disease and associated heart failure.  Cardiology was making active recommendations.  There was fevers noted, started on antibiotics.  Gets periods of asystolic arrest during a TEE on the 20th. He had marked it as dyssynchrony on the ventilator machine.  Throughout his hospitalization, he was on pressors off and on as well as stress steroids, diuretics.  He underwent EEG, which showed generalized slowing again on the 23rd secondary to worsening neurologic status and MRI of the brain, which showed 3 small acute ischemic infarct areas, likely embolic in nature.  The wife was in distress appropriately, but thought that there would be other options in terms of heart transplant and other opportunities.  We were contacted  per her request on multiple occasions for which they were crystal clear to note that there was nothing to be done for this patient and so there was multiple meetings with palliative care as well as myself in the end with the wife who slowly was understanding that we need to provide comfort care.  He was not progressing of the ventilator machine.  He had renal failure, respiratory failure, bilateral infiltrates, ARDS, and then again on the 24th, suffered a left-sided tension pneumothorax spontaneously on the ventilator machine, for which chest tube unfortunately was placed and finally the wife understood that this was a terminal and we were putting him through a lot of suffering for no reason and with no improvement of quality of life.  Comfort care was provided and the patient expired.  FINAL DIAGNOSES:  Upon death: 1. Status post cardiac arrest, secondary to ischemia, secondary to     coronary artery disease. 2. Coronary artery disease, with no options of therapy. 3. Acute respiratory failure. 4. Acute renal failure. 5. Ventricular fibrillation, secondary to cardiac ischemia. 6. Tension pneumothorax spontaneously on the ventilator machine. 7. Pneumonia. 8. Small embolic cerebrovascular accidents (CVAs).     Raylene Miyamoto, MD  DJF/MEDQ  D:  09/28/2015  T:  09/28/2015  Job:  [id number]

## 2015-09-29 NOTE — Progress Notes (Signed)
After MRI was done RT noticed frank blood coming from the ET Tube and the vent peak pressure alarm started to go off continuously. When we arrived in the unit, I bagged and lavaged the patient several times. RT suctioned copious amount of bright red bloody secretions. Elink was notified and chest xray was ordered.

## 2015-09-29 NOTE — Progress Notes (Signed)
Paged e-link MD regarding patient's change in breathing and tachycardia. RT at bedside lavaging and bagging pt. Stat xray to be performed. Will continue to monitor.

## 2015-09-29 NOTE — Procedures (Signed)
Extubation Procedure Note  Patient Details:   Name: Patrick Sexton [facility] DOB: 1946/08/18 MRN: [id number]   Airway Documentation:    Terminally extubated, current pt status is acceptable.  Evaluation  O2 sats: currently acceptable Complications: No apparent complications Patient did tolerate procedure well. Bilateral Breath Sounds: Diminished Suctioning: Oral No  [place] R Shaquavia Whisonant 04-Oct-2015, 3:49 PM

## 2015-09-29 NOTE — Progress Notes (Signed)
Daily Progress Note   Patient Name: Patrick Sexton [facility]       Date: 2015/10/20 DOB: 12-28-1945  Age: 70 y.o. MRN#: [id number] Attending Physician: Raylene Miyamoto, MD Primary Care Physician: Carmon Ginsberg, PA Admit Date: 09/07/2015  Reason for Consultation/Follow-up: Establishing goals of care and Psychosocial/spiritual support  Subjective:  Discussed situation with Dr Titus Mould.  Patient is not progressing despite continued aggressive medical interventions.  Today he set boundaries for continuation of aggressive measures and discussed with wife.  Both agree no trach or PEG.  Shifting toward a more comfort approach, wife continues to verbalize her internal struggle of " did I do everything I could ?? "   All staff have been supporting Patrick Sexton in hopes of helping her understand the very poor prognosis. I continue to express my worry that his prognosis remains poor and that continuing aggressive interventions will not change the outcome.  We discussed the concept of mortality  Created space and opportunity for Patrick Sexton to share her thoughts and feelings and fears regarding current situation.  She is tearful.  Emotional support offered  PMT will continue to support holistically.   Length of Stay: 17 days  Current Medications: Scheduled Meds:  . albuterol  2.5 mg Nebulization Q4H  . antiseptic oral rinse  7 mL Mouth Rinse 10 times per day  . aspirin  81 mg Oral Daily  . atorvastatin  80 mg Oral q1800  .  ceFAZolin (ANCEF) IV  2 g Intravenous Q8H  . chlorhexidine gluconate  15 mL Mouth Rinse BID  . digoxin  0.125 mg Per Tube Daily  . free water  300 mL Per Tube 6 times per day  . furosemide  40 mg Intravenous BID  . hydrocortisone  50 mg Per Tube 4 times per day  . insulin aspart  2-6  Units Subcutaneous 6 times per day  . insulin glargine  10 Units Subcutaneous Q24H  . lactulose  30 g Per Tube BID  . metoCLOPramide (REGLAN) injection  5 mg Intravenous 4 times per day  . pantoprazole sodium  40 mg Per Tube Daily  . polyethylene glycol  17 g Per Tube Daily  . senna-docusate  1 tablet Per Tube QHS  . sodium chloride  10-40 mL Intracatheter Q12H  . sodium chloride  3 mL  Intravenous Q12H  . sodium chloride  3 mL Intravenous Q12H  . vancomycin  750 mg Intravenous Q12H    Continuous Infusions: . sodium chloride Stopped (09/16/15 0830)  . amiodarone 30 mg/hr (2015/10/03 0617)  . dextrose 10 mL/hr at 09/20/15 1900  . feeding supplement (VITAL AF 1.2 CAL) 1,000 mL (09/20/15 2131)  . fentaNYL infusion INTRAVENOUS 200 mcg/hr (October 03, 2015 0726)  . insulin (NOVOLIN-R) infusion 1.9 Units/hr (10/03/2015 [id number])  . midazolam (VERSED) infusion 4 mg/hr (2015-10-03 0726)  . norepinephrine (LEVOPHED) Adult infusion Stopped (09/19/15 2300)    PRN Meds: sodium chloride, acetaminophen, albuterol, fentaNYL, midazolam, midazolam, midazolam, ondansetron (ZOFRAN) IV, pneumococcal 23 valent vaccine, sodium chloride, sodium chloride, sodium chloride  Physical Exam: Physical Exam  Constitutional: He appears cachectic. He appears ill. He is intubated.  Cardiovascular: Tachycardia present.   Pulmonary/Chest: He is intubated.  Abdominal: Soft. Normal appearance.  Neurological: He is unresponsive.  -unable to follow simple commands  Skin: Skin is warm and dry.                Vital Signs: BP 119/65 mmHg  Pulse 112  Temp(Src) 98.2 F (36.8 C) (Oral)  Resp 17  Ht 5\' 9"  (1.753 m)  Wt 173 lb 1 oz (78.5 kg)  BMI 25.54 kg/m2  SpO2 100% SpO2: SpO2: 100 % O2 Device: O2 Device: Ventilator O2 Flow Rate:    Intake/output summary:   Intake/Output Summary (Last 24 hours) at 03-Oct-2015 0730 Last data filed at 10/03/15 0700  Gross per 24 hour  Intake 2858.86 ml  Output   1649 ml  Net 1209.86 ml    LBM: Last BM Date: 09/20/15 Baseline Weight: Weight: 175 lb (79.379 kg) Most recent weight: Weight: 173 lb 1 oz (78.5 kg)       Palliative Assessment/Data: Flowsheet Rows        Most Recent Value   Intake Tab    Referral Department  Cardiology   Unit at Time of Referral  ICU   Palliative Care Primary Diagnosis  Cardiac   Date Notified  09/13/15   Palliative Care Type  New Palliative care   Reason for referral  Clarify Goals of Care   Date of Admission  09/20/2015   # of days IP prior to Palliative referral  9   Clinical Assessment    Psychosocial & Spiritual Assessment    Palliative Care Outcomes       Additional Data Reviewed: CBC    Component Value Date/Time   WBC 32.2* 10-03-15 0459   RBC 3.18* 10-03-2015 0459   HGB 7.9* 10/03/2015 0459   HCT 28.7* 10/03/2015 0459   PLT 551* 03-Oct-2015 0459   MCV 90.3 Oct 03, 2015 0459   MCH 24.8* October 03, 2015 0459   MCHC 27.5* 03-Oct-2015 0459   RDW 20.1* 10/03/2015 0459   LYMPHSABS 0.6* 09/09/2015 0230   MONOABS 0.7 09/09/2015 0230   EOSABS 0.0 09/09/2015 0230   BASOSABS 0.0 09/09/2015 0230    CMP     Component Value Date/Time   NA 149* 03-Oct-2015 0459   K 4.7 Oct 03, 2015 0459   CL 109 03-Oct-2015 0459   CO2 29 2015/10/03 0459   GLUCOSE 319* 10-03-2015 0459   BUN 73* Oct 03, 2015 0459   CREATININE 1.79* 10-03-2015 0459   CALCIUM 7.7* 10-03-2015 0459   PROT 5.6* 2015/10/03 0459   ALBUMIN 2.6* 10-03-15 0459   AST 38 03-Oct-2015 0459   ALT 20 10/03/2015 0459   ALKPHOS 74 Oct 03, 2015 0459   BILITOT 0.8 Oct 03, 2015 [place]  37* 2015/09/28 0459   GFRAA 43* 09-28-2015 0459       Problem List:  Patient Active Problem List   Diagnosis Date Noted  . Respiratory failure ([place])   . Palliative care encounter   . DNR (do not resuscitate) 09/14/2015  . Encounter for orogastric tube placement   . Acute respiratory failure with hypoxemia ([place])   . Encounter for central line placement   . Acute respiratory failure ([place])    . Aspiration pneumonia ([place])   . 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 PAF- NSR at LOV- now in AF with RVR post arrest 09/06/2015  . Cardiac arrest while shoveling snow 08/31/2015  . LBBB (left bundle branch block) 09/22/2015  . Elevated troponin   . Lactic acid acidosis   . CCF (congestive cardiac failure) ([place]) 02/10/2015  . Arthritis, degenerative 02/10/2015  . HLD (hyperlipidemia) 02/10/2015  . Essential hypertension 02/10/2015  . AF (paroxysmal atrial fibrillation) ([place]) 02/10/2015  . H/O CABG x 4 2008 02/10/2015     Palliative Care Assessment & Plan    Code Status:  DNR-previously documented     Code Status Orders        Start     Ordered   09/08/15 1325  Do not attempt resuscitation (DNR)   Continuous    Question Answer Comment  In the event of cardiac or respiratory ARREST Do not call a "code blue"   In the event of cardiac or respiratory ARREST Do not perform Intubation, CPR, defibrillation or ACLS   In the event of cardiac or respiratory ARREST Use medication by any route, position, wound care, and other measures to relive pain and suffering. May use oxygen, suction and manual treatment of airway obstruction as needed for comfort.      09/08/15 1324    Code Status History    Date Active Date Inactive Code Status Order ID Comments User Context   09/25/2015  6:11 PM 09/08/2015  1:24 PM Full Code [id number]  Elsie Stain, MD Inpatient   09/03/2015  5:05 PM 09/25/2015  6:11 PM Full Code [id number]  Grace Bushy Minor, NP Inpatient   09/16/2015  4:58 PM 08/31/2015  5:05 PM Full Code [id number]  Grace Bushy Minor, NP Inpatient        Goals of Care/Additional Recommendations:   Desire for further Chaplaincy support:yes  Psycho-social Needs: Grief/Bereavement Support   Palliative Prophylaxis:   Aspiration, Bowel Regimen, Delirium Protocol, Eye Care, Frequent Pain Assessment, Oral Care and Turn Reposition     Discharge Planning:  Pending outcomes-expect hospital  death   Care plan was discussed with Dr Titus Mould  Thank you for allowing the Palliative Medicine Team to assist in the care of this patient.   Time In: 1330 Time Out: 1405 Total Time 35 min Prolonged Time Billed  no         Knox Royalty, NP  09/28/15, 7:30 AM  Please contact Palliative Medicine Team phone at [phone] for questions and concerns.

## 2015-09-29 NOTE — Progress Notes (Signed)
[place] Progress Note Patient Name: Patrick Sexton [facility] DOB: 1946/04/29 MRN: [id number]   Date of Service  2015/10/14  HPI/Events of Note  Review of portable CXR >> L pneumothorax, likely under tension as Ppeak have gone way up.   eICU Interventions  On call team, Nurse Practitioner and PCCM Attending, summoned to bedside to place chest tube.      Intervention Category Major Interventions: Other:  Lysle Dingwall Oct 14, 2015, 2:46 AM

## 2015-09-29 NOTE — Progress Notes (Signed)
Pt has order for terminal extubation with all medical device to be discontinued, As this RN was taking off cardiac monitor, pt's wife stated she needed to make some errands now otherwise if she waited for husband to expire, she would be too devasted. She stated she would be back in one hour and half (3:30ish). RT informed Will cont to monitor.  Pt extubated to room air with wife at bedside. Pt immediately bolused with morphine and suctioned to no avail. Versed gtt also titrated up for comfort. Pt continued to make gurgly sounds and wife asked if he could be put on oxygen. Pt placed on oxygen for comfort. Morphine titrated to maximum limit , with no change in labored breathing and gurglu sound. Elink notified, new order received and implemented.   Chaplain in room with pt's wife providing comfort. Observed pt no longer breathing. Charge RN Clarene Critchley) called into room to assist in listening to heart tones. Pt pronounced at 1723. Elink notified and Dr. Titus Mould notified while rounding. All pertinent information received from wife before leaving.  Post mortem care done.

## 2015-09-29 NOTE — Progress Notes (Signed)
Chaplain responded to a request by the Nursing Unit to provide emotional and grief support for the wife whose husband is receiving end of life care. Chaplain provided empathic listening and the wife share life stories of the patient. A prayer of comfort and peace for her was give, and a prayer for a peaceful transition for the patient.  Spiritual care follow up would be appreciated by the wife as she has limited support. Chaplain Yaakov Guthrie [phone]

## 2015-09-29 NOTE — Progress Notes (Signed)
   2015/10/11 1200  Clinical Encounter Type  Visited With Family;Patient and family together  Visit Type Follow-up;Patient actively dying  Referral From Nurse  Spiritual Encounters  Spiritual Needs Emotional;Grief support  [facility] paged for EOL of pt; Comfort and prayer offered to spouse who is grieving; [facility] available as needed to support family. 12:57 PM Gwynn Burly

## 2015-09-29 NOTE — Progress Notes (Signed)
PULMONARY / CRITICAL CARE MEDICINE   Name: Patrick Sexton [facility] MRN: [id number] DOB: [birth year]    ADMISSION DATE:  09/09/2015 CONSULTATION DATE:  09/18/2015   REFERRING MD :  EDP PRIMARY SERVICE: PCCM   BRIEF PATIENT DESCRIPTION:  44-yo M with 15 min vfib arrest while shoveling snow s/p LHC with no active ischemia in setting of severe CAD with another vfib arrest in cath lab s/p hypothermic protocol and intubation.    SIGNIFICANT EVENTS / STUDIES:  1/7 Vfib arrest, LHC with no intervention but severe LV dysfunction and native/graft disease 1/7 CT head with mild atrophy 1/8 Shock 1/9 Rewarmed, followed commands 1/9 EEG with generalized slowing  1/10 Follows commands 1/11 Dnr established 1/16 Fevers overnight 1/20 Short period of asystole during TEE 1/21  Marked vent asynchrony requiring NAVA Shock again-->likely d/t poor vent interaction. 1/21 Started on levo and stress steroids   1/22 Started on IV lasix, weaned off levo 1/23 EEG with generalized slowing  1/24 MRI brain with three total small acute ischemic infarcts of b/l centrum semi ovale and left occipital without mass effect  1/24 Left sided tension pneumothorax after MRI requiring chest tube placement    LINES / TUBES: 1/7 ETT >> 1/7 Left IJ >>  1/7 R rad a line >> 1/12 1/24 Chest tube   CULTURES: 1/7 MRSA >> neg 1/7 Urine >> neg 1/7 Blood >>neg  1/16 Blood >> neg 1/16 Respiratory >> Pan-sensitive moderate Klebsiella Pneumoniae 1/16 Urine >> neg 1/22 Blood >> 1/2 with GPC in clusters  ANTIBIOTICS: Vancomycin 1/9 >> 1/11 Zosyn 1/9 >> 1/11 Unasyn 1/11 >> 1/15 Vanc 1/17 >> 1/19 Ceftaz 1/17 >> 1/19 Ancef 1/19 >> Vancomycin 1/23 >>  SUBJECTIVE:  Yesterday 1/2 blood cultures with GPC in clusters and was started on vanc. Pt was evaluated by neurology.  EEG did not reveal seizures.  MRI brain revealed three small strokes. After MRI pt had frank blood from ET tube and required bagging. CXR revealed large left tension  pneumothorax. Wife consented to chest tube placement. Now starting insulin drip due to increased blood sugars.  Net +6 L since admission with CVP 20.   VITAL SIGNS: Temp:  [97.9 F (36.6 C)-99.7 F (37.6 C)] 98.2 F (36.8 C) (01/24 0400) Pulse Rate:  [64-114] 112 (01/24 0615) Resp:  [17-32] 17 (01/24 0700) BP: (92-155)/(15-117) 119/65 mmHg (01/24 0700) SpO2:  [90 %-100 %] 100 % (01/24 0615) FiO2 (%):  [30 %-100 %] 100 % (01/24 0400) Weight:  [173 lb 1 oz (78.5 kg)] 173 lb 1 oz (78.5 kg) (01/24 0500) HEMODYNAMICS: CVP:  [6 mmHg-20 mmHg] 20 mmHg VENTILATOR SETTINGS: Vent Mode:  [-] PRVC FiO2 (%):  [30 %-100 %] 100 % Set Rate:  [30 bmp] 30 bmp Vt Set:  [400 mL-450 mL] 400 mL PEEP:  [5 cmH20] 5 cmH20 Plateau Pressure:  [14 cmH20-33 cmH20] 33 cmH20 INTAKE / OUTPUT: Intake/Output      01/23 0701 - 01/24 0700 01/24 0701 - 01/25 0700   I.V. (mL/kg) 1110.7 (14.1)    NG/GT 1198.2    IV Piggyback 550    Total Intake(mL/kg) 2858.9 (36.4)    Urine (mL/kg/hr) 1605 (0.9)    Stool 0 (0)    Chest Tube 44 (0)    Total Output 1649     Net +1209.9          Stool Occurrence 2 x      PHYSICAL EXAMINATION: General: Ill appearing, sedated on vent Neuro:  RAAS -3 on versed/fentanyl  HEENT:  Normocephalic, OG in place  Cardiovascular:  Irregularly irregular rhythm with normal rate.. Left sided chest tube in place. Lungs:  Ronchi/coarse in anterior fields Abdomen:  Soft, non-tender, non-distended, decreased BS Musculoskeletal: SCD's, trace LE edema Skin: No rashes  LABS:  CBC  Recent Labs Lab 09/19/15 0400 09/20/15 0410 2015-10-08 0459  WBC 25.5* 16.3* 32.2*  HGB 8.3* 7.5* 7.9*  HCT 28.5* 25.1* 28.7*  PLT 505* 311 551*   Coag's  Recent Labs Lab 09/15/15 0444  INR 1.43   BMET  Recent Labs Lab 09/19/15 0400 09/20/15 0410 08-Oct-2015 0459  NA 150* 150* 149*  K 4.1 4.0 4.7  CL 113* 110 109  CO2 27 28 29   BUN 56* 64* 73*  CREATININE 1.15 1.24 1.79*  GLUCOSE 252* 175* 319*    Electrolytes  Recent Labs Lab 09/16/15 0500  09/18/15 0359 09/19/15 0400 09/20/15 0410 2015/10/08 0459  CALCIUM 7.2*  < > 7.6* 7.9* 7.7* 7.7*  MG 2.4  < > 2.8*  --  2.6* 3.0*  PHOS 4.7*  --   --   --  3.3 9.7*  < > = values in this interval not displayed. Sepsis Markers  Recent Labs Lab 09/20/15 0410 10-08-15 0459  PROCALCITON 0.92 1.53   ABG  Recent Labs Lab 09/18/15 0837 09/18/15 1221 09/19/15 0348  PHART 7.346* 7.488* 7.462*  PCO2ART 48.6* 35.2 34.9*  PO2ART 106.0* 110.0* 131*   Liver Enzymes  Recent Labs Lab 09/19/15 0400 2015/10/08 0459  AST 26 38  ALT 30 20  ALKPHOS 66 74  BILITOT 1.0 0.8  ALBUMIN 2.5* 2.6*   Cardiac Enzymes No results for input(s): TROPONINI, PROBNP in the last 168 hours. Glucose  Recent Labs Lab 09/20/15 0355 09/20/15 0755 09/20/15 1144 09/20/15 1549 09/20/15 2029 09/20/15 2325  GLUCAP 181* 149* 162* 161* 161* 157*    Imaging Mr Brain Wo Contrast  08-Oct-2015  CLINICAL DATA:  Initial evaluation for anoxic brain injury. Status post cardiac arrest. EXAM: MRI HEAD WITHOUT CONTRAST TECHNIQUE: Multiplanar, multiecho pulse sequences of the brain and surrounding structures were obtained without intravenous contrast. COMPARISON:  Prior CT from 09/03/2014. FINDINGS: Study somewhat degraded by motion artifact. Diffuse prominence of the CSF containing spaces is compatible with generalized age-related cerebral atrophy. Patchy T2/FLAIR hyperintensity within the periventricular and deep white matter both cerebral hemispheres present, most consistent with chronic small vessel ischemic disease, mild in nature. There are 3 total ischemic infarcts. Largest cyst position within the right centrum semi ovale and measures 12 mm (series 7, image 17). Additional 10 mm infarct within the posterior left centrum semi ovale in the left parietal lobe (series 7, image 10). Additional small cortical infarct within the left occipital pole measures 7 mm (series 3,  image 22). No significant mass effect with these infarcts. No associated hemorrhage. No other evidence for cerebral anoxia. Major intracranial vascular flow voids are maintained. No acute or chronic intracranial hemorrhage. No mass lesion, midline shift, or mass effect. No hydrocephalus. No extra-axial fluid collection. Craniocervical junction within normal limits. Pituitary gland normal. No acute abnormality about the orbits. Axial myopia noted. Paranasal sinuses are largely clear. Bilateral mastoid effusions noted. Postoperative changes from prior left frontoparietal craniectomy. No acute abnormality about the scalp soft tissues. Bone marrow signal intensity within normal limits. IMPRESSION: 1. Three total small acute ischemic nonhemorrhagic infarcts involving the bilateral centrum semi ovale as well as the left occipital pole as above. No significant mass effect. 2. No other MRI evidence for global cerebral anoxia.  3. Mild chronic small vessel ischemic disease. 4. Bilateral mastoid effusions. Electronically Signed   By: Jeannine Boga M.D.   On: 2015-10-02 05:05   Dg Chest Port 1 View  2015-10-02  CLINICAL DATA:  Acute onset of worsening respiratory distress. Initial encounter. EXAM: PORTABLE CHEST 1 VIEW COMPARISON:  Chest radiograph performed earlier today at 3:21 a.m. FINDINGS: The patient's endotracheal tube is seen ending 2 cm above the carina. An enteric tube is noted extending below the diaphragm. The left IJ line is noted ending overlying the mid SVC. The left-sided chest tube is unchanged in appearance. No pneumothorax is seen. However there is new soft tissue air along the right chest wall, reflecting air tracking across the abdominal wall, and new air along the left side of the neck. There is also increased pneumomediastinum, likely tracking inferiorly from the neck. Patchy bilateral airspace opacification is again noted, concerning for pneumonia. Underlying interstitial edema is a concern. No  definite pleural effusion is identified. The cardiomediastinal silhouette is mildly enlarged. The patient is status post median sternotomy, with evidence of prior CABG. No acute osseous abnormalities are seen. IMPRESSION: 1. New soft tissue air along the right chest wall, reflecting air tracking across the abdominal wall, and new air along the left side of the neck. Increased pneumomediastinum likely tracks inferiorly from the neck. This reflects the patient's left-sided chest tube. Would correlate clinically. 2. Patchy bilateral airspace opacification again noted, concerning for pneumonia. Underlying interstitial edema is a concern. Mild cardiomegaly. These results were called by telephone at the time of interpretation on October 02, 2015 at 5:12 am to [facility] on [facility], who verbally acknowledged these results. Electronically Signed   By: Garald Balding M.D.   On: October 02, 2015 05:14   Dg Chest Port 1 View  10/02/2015  CLINICAL DATA:  Status post left-sided chest tube placement. Initial encounter. EXAM: PORTABLE CHEST 1 VIEW COMPARISON:  Chest radiograph performed earlier today at 2:35 a.m. FINDINGS: There has been interval placement of a left apical chest tube, with re-expansion of the left lung. No definite residual pneumothorax is seen. The patient's endotracheal tube is seen ending 3 cm above the carina. An enteric tube is noted ending at the mid esophagus; this could be advanced at least 20 cm. A left IJ line is noted ending about the mid SVC. Patchy bilateral airspace opacification raises concern for pneumonia, given the the more focal appearance at the right lung base. Underlying vascular congestion is noted. Mild interstitial edema cannot be excluded. There appears to be some degree of pneumomediastinum, stable in appearance and likely reflecting extension from the recent pneumothorax. The cardiomediastinal silhouette is mildly enlarged. The patient is status post median sternotomy, with evidence of prior CABG.  No acute osseous abnormalities are seen. Soft tissue air is noted along the left chest wall. IMPRESSION: 1. Interval placement of left apical chest tube, with re-expansion of the left lung. No definite residual pneumothorax seen. 2. Enteric tube noted ending at the mid esophagus. This could be advanced at least 20 cm. 3. Patchy bilateral airspace opacification raises concern for pneumonia. Underlying vascular congestion noted. Mild interstitial edema cannot be excluded. 4. Underlying pneumomediastinum is stable and likely reflects extension from the prior pneumothorax. 5. Mild cardiomegaly noted. Electronically Signed   By: Garald Balding M.D.   On: 2015/10/02 03:33   Dg Chest Port 1 View  October 02, 2015  CLINICAL DATA:  Endotracheal tube.  Shortness of breath. EXAM: PORTABLE CHEST 1 VIEW COMPARISON:  09/20/2015 FINDINGS: Postoperative changes in  the mediastinum. Endotracheal tube tip measures 3.7 cm above the carina. Enteric tube tip is demonstrated in the mid/lower esophagus. Advancement is suggested. Left central venous catheter with tip over the low SVC region. Since the previous study, there is interval development of a left tension pneumothorax with complete collapse of the left lung. There is diffuse airspace infiltration throughout the right lung. No blunting of costophrenic angles. Heart size is normal. IMPRESSION: New development of a tension left pneumothorax. The left lung is collapsed. Diffuse infiltration in the right lung. Endotracheal tube and left central venous catheter appear in satisfactory position. NG tube tip is at the level of the low/mid esophagus and needs to be advanced. These results were called by telephone at the time of interpretation on 09-27-15 at 2:46 am to [facility] on [facility] heart vascular, who verbally acknowledged these results. Electronically Signed   By: Lucienne Capers M.D.   On: 2015-09-27 02:47   Dg Chest Port 1 View  09/20/2015  CLINICAL DATA:  Hypoxia EXAM: PORTABLE CHEST 1  VIEW COMPARISON:  September 19, 2015 FINDINGS: Endotracheal tube tip is 4.2 cm above the carina. Nasogastric tube and feeding tube tips are below the diaphragm. Central catheter tip is in the superior vena cava. No pneumothorax. There is moderate interstitial edema bilaterally. There is patchy alveolar opacity in the bases, likely alveolar edema. Heart is mildly enlarged with pulmonary venous hypertension. IMPRESSION: Tube and catheter positions as described without pneumothorax. Evidence a degree of congestive heart failure. There may be slightly more alveolar opacity, likely alveolar edema, in the lung bases. It should be noted that superimposed pneumonia in the bases cannot be excluded radiographically. Electronically Signed   By: Lowella Grip III M.D.   On: 09/20/2015 07:06   Dg Chest Port 1 View  09/19/2015  CLINICAL DATA:  Intubation. EXAM: PORTABLE CHEST 1 VIEW COMPARISON:  09/18/2015 FINDINGS: Endotracheal tube is 2 cm superior to the carina. Enteric catheter and feeding catheter are collimated off the image. Left internal jugular approach central venous catheter are stable. Postsurgical changes from median sternotomy and CABG are stable. The cardiac silhouette is enlarged. Mediastinal contours appear intact. There is no evidence of focal airspace consolidation, pleural effusion or pneumothorax. There are coarse interstitial markings. Unchanged patchy scattered bilateral airspace consolidation. Osseous structures are without acute abnormality. Soft tissues are grossly normal. IMPRESSION: Interval apparent advancement of the endotracheal tube, which terminates 2 cm from the carina. Otherwise stable support apparatus. Cardiomegaly. Stable diffuse bilateral patchy airspace consolidation and chronic interstitial lung changes. Electronically Signed   By: Fidela Salisbury M.D.   On: 09/19/2015 15:37   Dg Abd Portable 1v  09-27-2015  CLINICAL DATA:  Orogastric tube placement.  Initial encounter. EXAM:  PORTABLE ABDOMEN - 1 VIEW COMPARISON:  Abdominal radiograph performed 09/19/2015, and chest radiograph performed earlier today at 3:21 a.m. FINDINGS: The patient's enteric tube is noted ending overlying the body of the stomach. The visualized bowel gas pattern is nonspecific, with improved distention of the stomach. Prominent soft tissue air is noted dissecting along the left lateral abdominal wall, status post left-sided chest tube placement. Underlying pneumomediastinum is again noted. This may be more prominent than on the recent prior chest radiograph. IMPRESSION: 1. Enteric tube noted ending overlying the body of the stomach. 2. Prominent soft tissue air dissecting along the left lateral abdominal wall, status post left-sided chest tube placement. Underlying pneumomediastinum appears somewhat more prominent. This raises concern for underlying air leak. Would correlate clinically. These results were  called by telephone at the time of interpretation on 2015-10-14 at 4:32 am to [facility] on [facility], who verbally acknowledged these results. Electronically Signed   By: Garald Balding M.D.   On: 2015/10/14 04:32   Dg Abd Portable 1v  09/19/2015  CLINICAL DATA:  Not tolerating tube feeds. EXAM: PORTABLE ABDOMEN - 1 VIEW COMPARISON:  09/02/2015 FINDINGS: The stomach is distended. Enteric catheter is seen within proximal stomach, side hole slightly past the expected location of gastroesophageal junction. Feeding catheter is also seen, tip at the expected location of the distal gastric body. There is relative paucity of small bowel gas. The colonic bowel gas pattern is normal. No evidence of free intra-abdominal fluid on this supine radiograph. A rectal catheter is visualized. IMPRESSION: Distended stomach with enteric and feeding catheters within the proximal and distal gastric body respectively. Relative paucity of small bowel gas. Normal colonic bowel gas pattern. Electronically Signed   By: Fidela Salisbury M.D.    On: 09/19/2015 15:41     CXR: resolved ptx with chest ube, air soft tissue, ett wnl  ASSESSMENT / PLAN:  PULMONARY A: Acute hypoxic respiratory failure requiring ventilatory support in setting of cardiac arrest Left sided tension pneumothorax with chest tube placement Pansensitive Klebsiella P.  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) - +4.9 L since admission Hyperlipidemia  P:  Cardiology following  Keep MAP >60 Hydrocortisone 40 Q 6 hr Day 2 Amiodorone drip, cards to changed to toprol-Xl Digoxin 0.125 mg daily Aspirin 81 mg daily  Lipitor 80 mg daily  PO lasix 40 mg BID to IV per cards , consider dc No ACEi  RENAL A: Nonoliguiric AKI - Cr 1.79 from 1.2 Hypernatremia - 149 Worsening renal failure  P:   Free water 300 mL Q 4 hr Monitor BMP and UOP Add d5w  GASTROINTESTINAL A:  Nutrition   GI PPx IMporved BM P:  Tube feeds   IV protonix 40 mg daily  Reglan 5 mg QID, dc Senokot-S daily   HEMATOLOGIC A:  Acute normocytic anemia in setting of critical illness - bleeding from ETT Reactive thrombocytosis  DVT Ppx P:  Monitor CBC Transfuse if Hg <7 SCD's  INFECTIOUS A:  Pansensitive Klebsiella P. aspiration PNA  GPC in clusters in 1/2 blood cultures - bacteremia vs contaminant    Leukocytosis -  uptrending  Bilateral mastoid effusions  P:   IV Ancef 2 g Q 8 hr Day 6/7 IV vancomycin Day 2 Monitor PCT - 0.92 to 1.53  Monitor CBC and fever curve  ENDOCRINE A:  Hyperglycemia  Relative adrenal insufficiency   Low T3/high T4/high TSH in setting of critical illness and amiodarone use P:   Insulin drip not indicated, add IV bolus insulin Lantus 10 U daily okay with d5 SSI for goal CBG<180 Hydrocortisone 40 Q 6 hr Day 2 Needs repeat TSH in 6 weeks  NEUROLOGIC A:  Anoxic brain injury s/p cardiac arrest and rewarming  Three acute small ischemic CVA EEG with generalized slowing  DNR status  P:   Neurology following  Will discuss again with wife today regarding very poor prognosis and withdraw of care Try to wean fentanyl and versed infusions  Versed and fentanyl PRN Fentanyl PRN Monitor TG level  WUA daily   Juluis Mire, MD PGY-3 IMTS Pager [phone]  Oct 21, 2015, 7:04 AM   STAFF NOTE: Linwood Dibbles, MD FACP have personally reviewed patient's available data, including medical history, events of note, physical examination and test results as part of my evaluation. I have discussed with resident/NP and other care providers such as pharmacist, RN and RRT. In addition, I personally evaluated patient and elicited key findings of: noted events overnight, this am apears agonal, get STAT pcxr to assess rt ptrx? Or expansion left, increase suction on left , has leak, worsening MODS, arf, etc, pressors will not change outcome , lasix to hold given worsening renal failure, we called [facility] and his cardiologist [facility], they reviewed all and HAVE nothing to offer, stated famiiar with case, MRI done, small cva noted x 3, eeg pending, he is actively dying , will again d/w wife to consider comfort care today earlier, stress steroids maintain, bme tin am , 1/2 BC likley contamin, vanc on board, aggressive heroics will be medically fuitle and ineffective, dc insulin drip, holding TF with resp status, insulin IV bolus attempt  The patient is critically ill with multiple organ systems failure and requires high complexity decision making for assessment and support, frequent evaluation and titration of therapies,  application of advanced monitoring technologies and extensive interpretation of multiple databases.   Critical Care Time devoted to patient care services described in this note is 30 Minutes. This time reflects time of care of this signee: Merrie Roof, MD FACP. This critical care time does not reflect procedure time, or teaching time or supervisory time of PA/NP/Med student/Med Resident etc but could involve care discussion time. Rest per NP/medical resident whose note is outlined above and that I agree with   Lavon Paganini. Titus Mould, MD, [place] Pgr: [facility] 2015/10/21 9:15 AM

## 2015-09-29 NOTE — Progress Notes (Signed)
STROKE TEAM PROGRESS NOTE   HISTORY OF PRESENT ILLNESS Patient with V. fib cardiac arrest on 09/19/2015 while shoveling snow, has been in ICU intubated, sedated for about 2.5 weeks. He has preserved brainstem reflexes on exam with pupils, corneals and gag reflex present. He grimaces to light tactile stimulus in all 4 extremities, but unable to withdraw or show any limb motor response to simulation during evaluation. Brain MRI showed three total small acute ischemic nonhemorrhagic infarcts involving the bilateral centrum semi ovale as well as the left occipital pole. No significant mass effect. No other MRI evidence for global cerebral anoxia. EEG was consistent with a generalized non-specific cerebral dysfunction(encephalopathy).  Patient was not administered TPA secondary to incidental finding.    SUBJECTIVE (INTERVAL HISTORY) No family present at the bedside. Dr. Leonie Man spoke with Dr. Titus Mould related to the case. Patient apparently presented with V. fib cardiac arrest 2 weeks ago and underwent hyperthermia and was rewarmed. He was never extubated but apparently was following commands but has been medically quite sick and more recently has developed further respiratory problems and is currently sedated. MRI shows bi-cerebral small embolic infarcts OBJECTIVE Temp:  [97.9 F (36.6 C)-98.6 F (37 C)] 98.6 F (37 C) (01/24 1131) Pulse Rate:  [64-114] 101 (01/24 1000) Cardiac Rhythm:  [-] Atrial fibrillation (01/24 0800) Resp:  [17-34] 30 (01/24 1000) BP: (104-155)/(15-117) 118/73 mmHg (01/24 0900) SpO2:  [90 %-100 %] 96 % (01/24 1000) FiO2 (%):  [30 %-100 %] 50 % (01/24 0800) Weight:  [78.5 kg (173 lb 1 oz)] 78.5 kg (173 lb 1 oz) (01/24 0500)  CBC:   Recent Labs Lab 09/20/15 0410 10-20-2015 0459  WBC 16.3* 32.2*  HGB 7.5* 7.9*  HCT 25.1* 28.7*  MCV 86.9 90.3  PLT 311 551*    Basic Metabolic Panel:   Recent Labs Lab 09/20/15 0410 10/20/2015 0459  NA 150* 149*  K 4.0 4.7  CL 110 109   CO2 28 29  GLUCOSE 175* 319*  BUN 64* 73*  CREATININE 1.24 1.79*  CALCIUM 7.7* 7.7*  MG 2.6* 3.0*  PHOS 3.3 9.7*    Lipid Panel:     Component Value Date/Time   TRIG 259* 09/19/2015 0400   HgbA1c: No results found for: HGBA1C Urine Drug Screen: No results found for: LABOPIA, COCAINSCRNUR, LABBENZ, AMPHETMU, THCU, LABBARB    IMAGING  Mr Brain Wo Contrast Oct 20, 2015  1. Three total small acute ischemic nonhemorrhagic infarcts involving the bilateral centrum semi ovale as well as the left occipital pole. No significant mass effect. 2. No other MRI evidence for global cerebral anoxia. 3. Mild chronic small vessel ischemic disease. 4. Bilateral mastoid effusions.   2D Echocardiogram  - Left ventricle: The cavity size was normal. Wall thickness wasincreased in a pattern of mild LVH. Systolic function was moderately to severely reduced. The estimated ejection fractionwas in the range of 30% to 35%. There is moderate hypokinesis ofthe mid-apicalanteroseptal myocardium. There is moderatehypokinesis of the mid-apicalinferior and apical myocardium. - Ventricular septum: Septal motion showed abnormal function,dyssynergy, and paradox. - Aortic valve: There was mild regurgitation. Valve area (VTI): 1.25 cm^2. Valve area (Vmax): 1.36 cm^2. Valve area (Vmean): 1.37cm^2. - Mitral valve: Calcified annulus. There was mild regurgitation. - Left atrium: The atrium was mildly dilated. - Right ventricle: Systolic function was mildly reduced. - Tricuspid valve: There was moderate regurgitation. - Pulmonary arteries: PA peak pressure: 60 mm Hg (S).   PHYSICAL EXAM Middle-aged Caucasian male who is sedated. He is in respiratory distress and  distress despite being sedated and being on the ventilator. He has left-sided chest tube. . Afebrile. Head is nontraumatic. Neck is supple without bruit.    Cardiac exam no murmur or gallop. Lungs are clear to auscultation. Distal pulses are well  felt. Neurological Exam : Is limited as patient is ounce sedation currently. Eyes are closed. He does not respond to tactile or noxious stimuli. Pupils are 3 mm reactive. Corneal reflexes are present bilaterally. Doll's eye movements are sluggish. He has respiratory effort above the ventilator setting. He has cough and gag. He does not grimace to pain. No motor response to sternal rub. Tone is increased bilaterally. Plantars are both mute. ASSESSMENT/PLAN [name] is a 70 y.o. male with history of V fib cardiac arrest 2.5 weeks ago who was found to have 3 small incidental strokes on MRI.   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   MRI  Bilateral centrum semiovale and L occpital small infarct  2D Echo  EF 30-35%, severe LVH  EEG with generalized non-specific cerebral dysfunction  HgbA1c 8.2  SCDs for VTE prophylaxis Diet NPO time specified  aspirin 81 mg daily prior to admission, now on aspirin 81 mg daily  Atrial Fibrillation  Off heparin d/t recurrent hematuria  Asystole w/ TEE prove  On amiodarone    Hypertension  Hyperlipidemia  Home meds:  lipitor 40, increased to 80 in hospital  LDL not drawn, goal < 70  Continue statin at discharge, depending on plan of care  Diabetes  HgbA1c 8.2, goal < 7.0  Uncontrolled  Other Stroke Risk Factors  Advanced age  Former Cigarette smoker, quit smoking 30 years ago   Coronary artery disease s/p cardiac arrest leading to current admission w/ severe native vessel disease, hx CABG  Other Active Problems  Cardiac arrest with poor prognosis, not a CABG or PCA candidate  Acute hypoxemic respiratory failure  Acute on chronic systolic HF  Hypernatremia  Klebsiella PNA and gm + cocci blood cx  L PNX  Given overall poor prognosis from his systemic medical problems and initial neurological improvement after cardiac arrest and  small size of his strokes accurate neurological prognosis cannot be reliably provided till his sedation continues.. No further workup recommended. Dr. Leonie Man available to speak with wife/family  as needed. He discussed with Dr. Titus Mould.     Hospital day # [facility] for Pager information 2015/10/04 12:35 PM  This patient is critically ill and at significant risk of neurological worsening, death and care requires constant monitoring of vital signs, hemodynamics,respiratory and cardiac monitoring, extensive review of multiple databases, frequent neurological assessment, discussion with family, other specialists and medical decision making of high complexity.I have made any additions or clarifications directly to the above note.This critical care time does not reflect procedure time, or teaching time or supervisory time of PA/NP/Med Resident etc but could involve care discussion time.  I spent 30 minutes of neurocritical care time  in the care of  this patient.  I have personally examined this patient, reviewed notes, independently viewed imaging studies, participated in medical decision making and plan of care. I have made any additions or clarifications directly to the above note. Agree with note above.    Antony Contras, MD Medical Director [facility] Pager: [phone] 04-Oct-2015 3:10 PM   To contact Stroke Continuity provider, please  refer to [email or web address] After hours, contact General Neurology

## 2015-09-29 NOTE — Procedures (Signed)
Chest Tube Insertion Procedure Note  Indications:  Clinically significant Pneumothorax  Pre-operative Diagnosis: L sided tension Pneumothorax  Post-operative Diagnosis: L sided Pneumothorax  Procedure Details  Informed consent was obtained for the procedure, including sedation.  Risks of lung perforation, hemorrhage, arrhythmia, and adverse drug reaction were discussed.   After sterile skin prep, using standard technique, a 28 French tube was placed in the left lateral 5th rib space.  Findings: None  Estimated Blood Loss:  Minimal         Specimens:  None              Complications:  None; patient tolerated the procedure well.         Disposition: ICU - intubated and critically ill.         Condition: stable  Georgann Housekeeper, AGACNP-BC [facility] Pager [phone] or [phone]  09/22/2015 3:30 AM

## 2015-09-29 NOTE — Procedures (Signed)
Extensive discussion with family. We discussed the poor prognosis and likely poor quality of life. Family has decided to offer full comfort care. They are aware that the patient may be transferred to palliative care floor for continued comfort care needs. They have been fully updated on the process and expectations. Daniel J. Feinstein, MD, FACP Pgr: [phone] [facility]   

## 2015-09-29 NOTE — Progress Notes (Signed)
Patient with left-sided tension pneumothorax. Discussed situation with wife over the phone, explained the invasive nature of placing chest tube and very poor prognosis and unlikely event that this would improve his outcome. She wished to proceed with chest tube placement and was encouraged to withhold further heroic measures. Wife will discuss with critical care team in the AM.   [name], MD PGY-3, Internal Medicine Pager: [phone]

## 2015-09-29 DEATH — deceased

## 2016-04-04 NOTE — Telephone Encounter (Signed)
error 

## 2017-05-09 IMAGING — CR DG ABD PORTABLE 1V
1 series · 1 of 1 positions shown · non-contrast
Comparison: Abdominal radiograph performed earlier today at [DATE]
p.m.

CLINICAL DATA: Orogastric tube placement.  Initial encounter.

EXAM:
PORTABLE ABDOMEN - 1 VIEW

[AP]
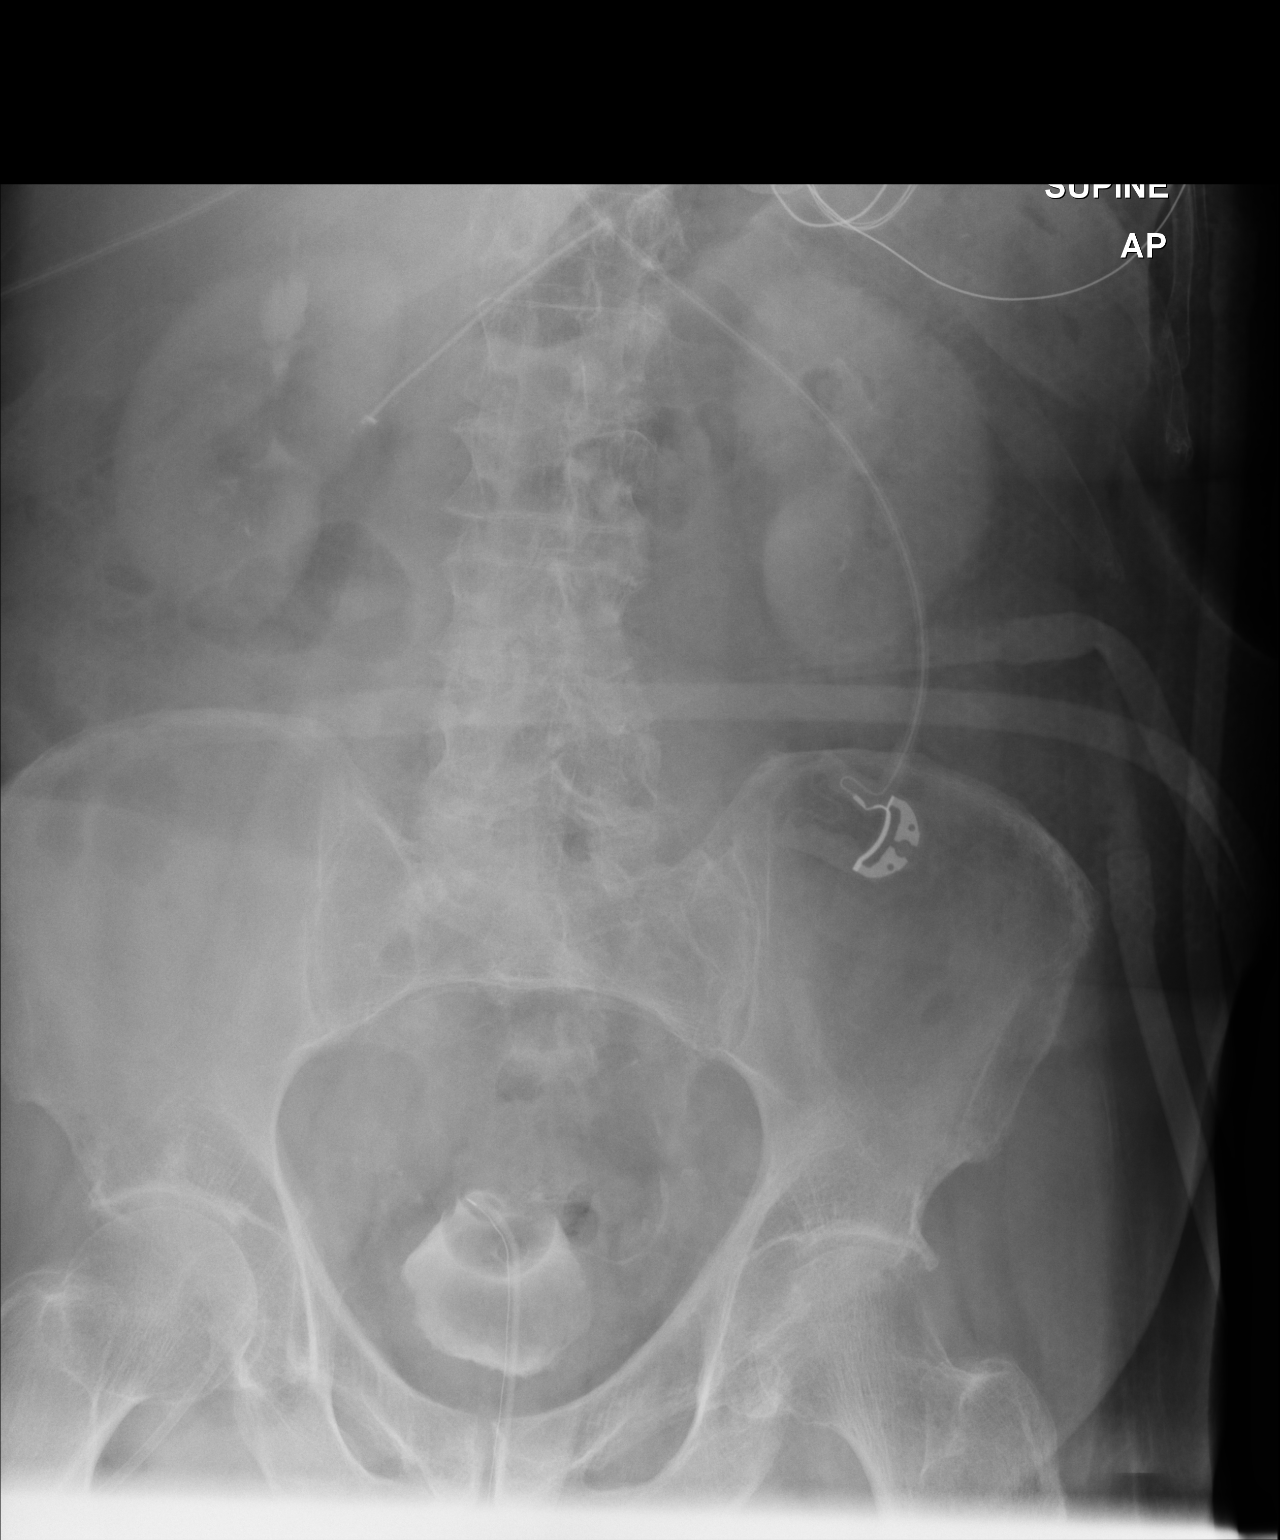

[1 of 1 positions shown; findings below may reference images not displayed]

FINDINGS: The patient's enteric tube is noted ending overlying the antrum of
the stomach.

The visualized bowel gas pattern is unremarkable. Scattered air and
stool filled loops of colon are seen; no abnormal dilatation of
small bowel loops is seen to suggest small bowel obstruction. No
free intra-abdominal air is identified, though evaluation for free
air is limited on a single supine view.

Contrast is noted within the bladder, with a Foley catheter noted in
place.

The visualized osseous structures are within normal limits; the
sacroiliac joints are unremarkable in appearance. The visualized
lung bases are essentially clear.
IMPRESSION: Enteric tube noted ending overlying the antrum of the stomach.

## 2017-05-11 IMAGING — CR DG CHEST 1V PORT
1 series · 1 of 1 positions shown · non-contrast
Comparison: 09/05/2015.

CLINICAL DATA: Acute respiratory failure.

EXAM:
PORTABLE CHEST 1 VIEW

[AP]
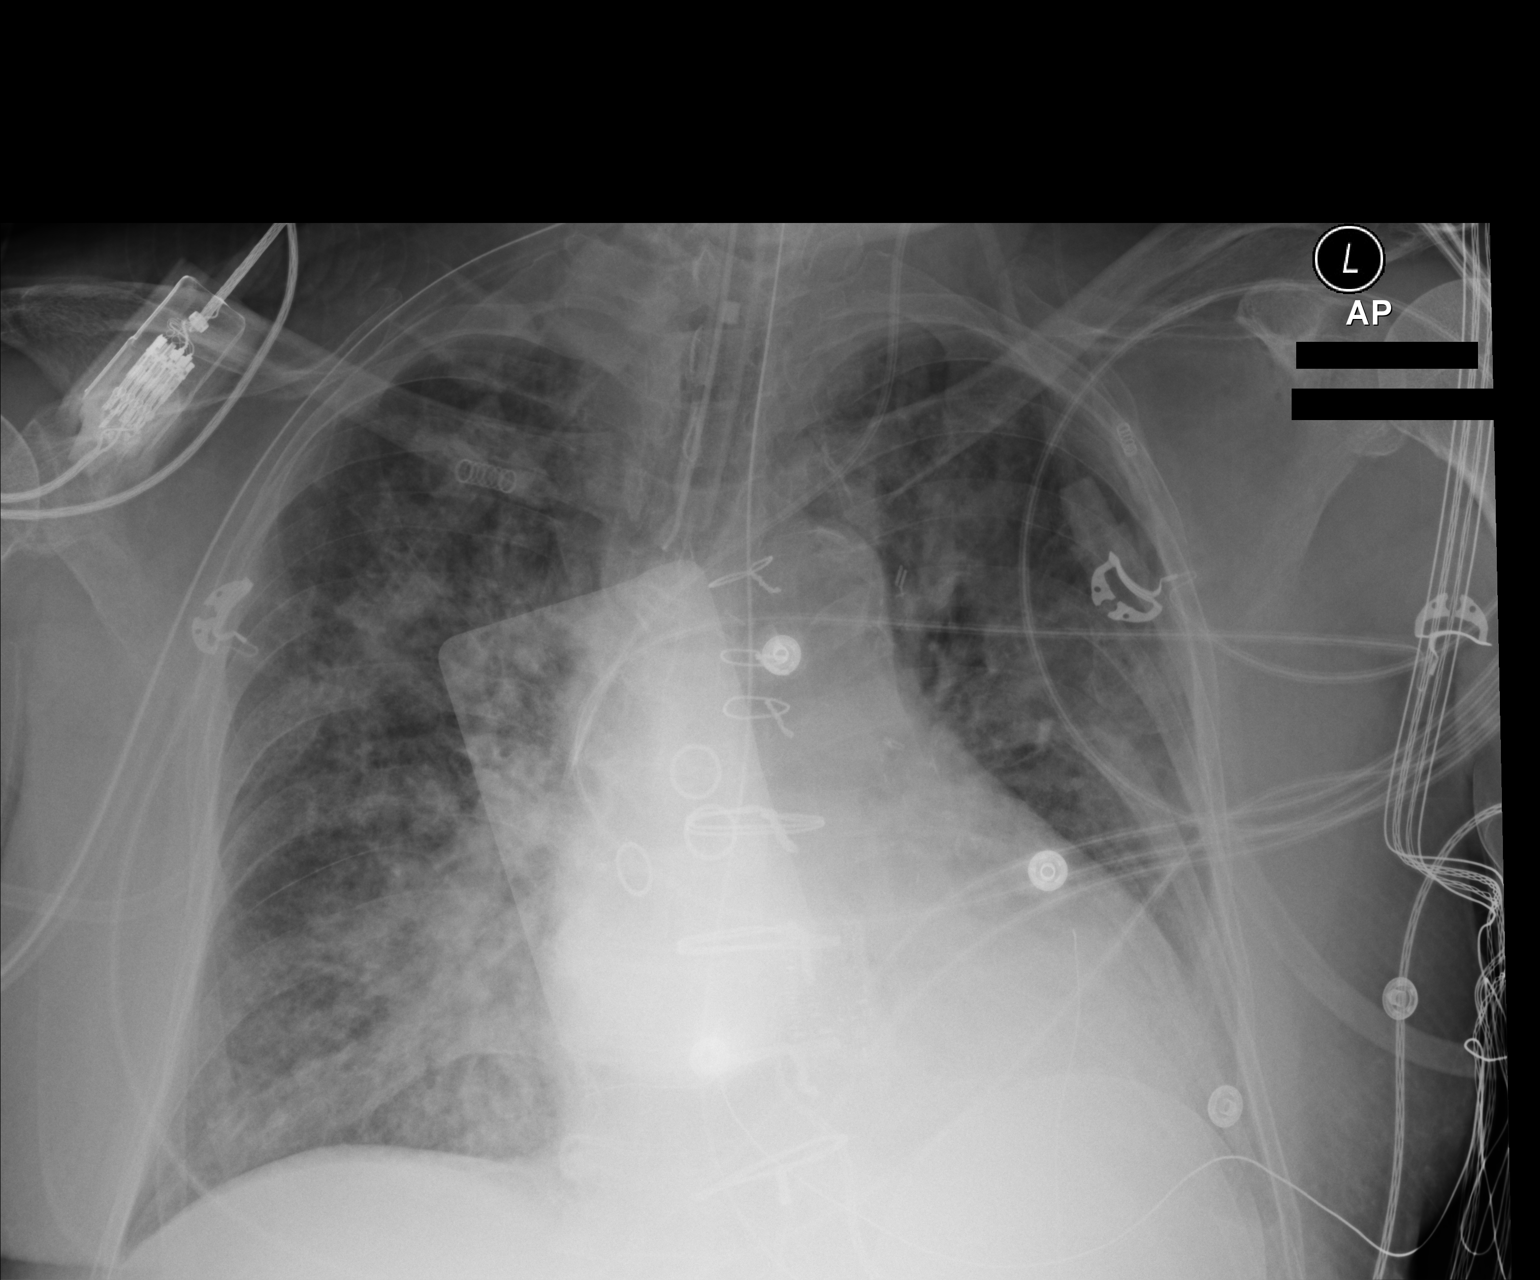

[1 of 1 positions shown; findings below may reference images not displayed]

FINDINGS: The support apparatus is stable. The endotracheal tube is 2.8 cm
above the carina. Persistent diffuse patchy asymmetric airspace
process which could be asymmetric edema or pulmonary infiltrates. No
pleural effusions. External pacer paddles appear stable.
IMPRESSION: Stable support apparatus.

Persistent bilateral airspace process.

## 2017-05-13 IMAGING — CR DG CHEST 1V PORT
1 series · 1 of 1 positions shown · non-contrast
Comparison: Portable chest x-ray dated September 07, 2015

CLINICAL DATA: Aspiration pneumonia, ventricular fibrillation,
cardiac arrest, acute respiratory failure.

EXAM:
PORTABLE CHEST 1 VIEW

[AP]
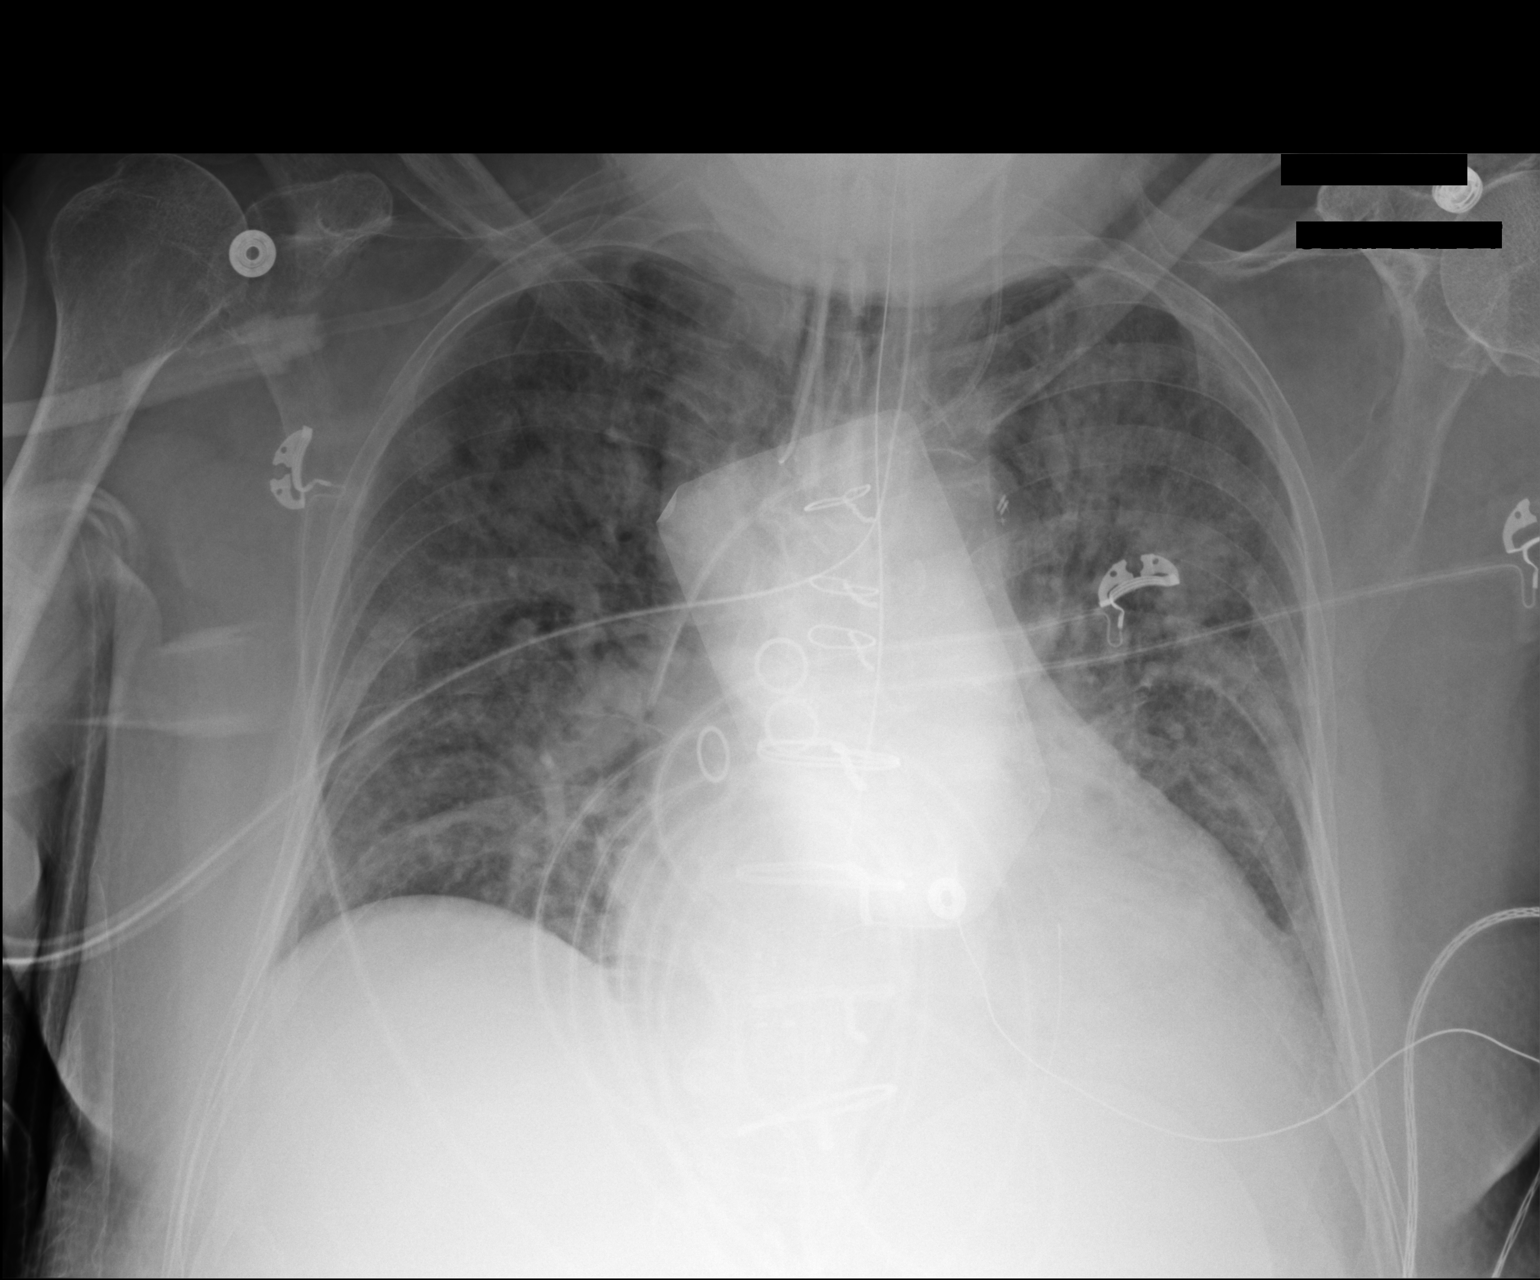

[1 of 1 positions shown; findings below may reference images not displayed]

FINDINGS: The lungs are adequately inflated. Diffuse interstitial and alveolar
opacities persist bilaterally. The left hemidiaphragm remains
obscured. The cardiac silhouette remains enlarged. The pulmonary
vascularity is indistinct. There is no pleural effusion. The
endotracheal tube tip lies approximately 4.8 cm above the carina.
The esophagogastric tube tip projects below the inferior margin of
the image. The left internal jugular venous catheter tip projects
over the midportion of the SVC. Post CABG changes are present and
stable.
IMPRESSION: Persistent widespread alveolar opacities compatible with pulmonary
edema or pneumonia. Stable mild enlargement of the cardiac
silhouette. The support tubes are in reasonable position.

## 2017-05-14 IMAGING — CR DG CHEST 1V PORT
1 series · 1 of 1 positions shown · non-contrast
Comparison: 09/08/2015.

CLINICAL DATA: Pneumonia.

EXAM:
PORTABLE CHEST 1 VIEW

[AP]
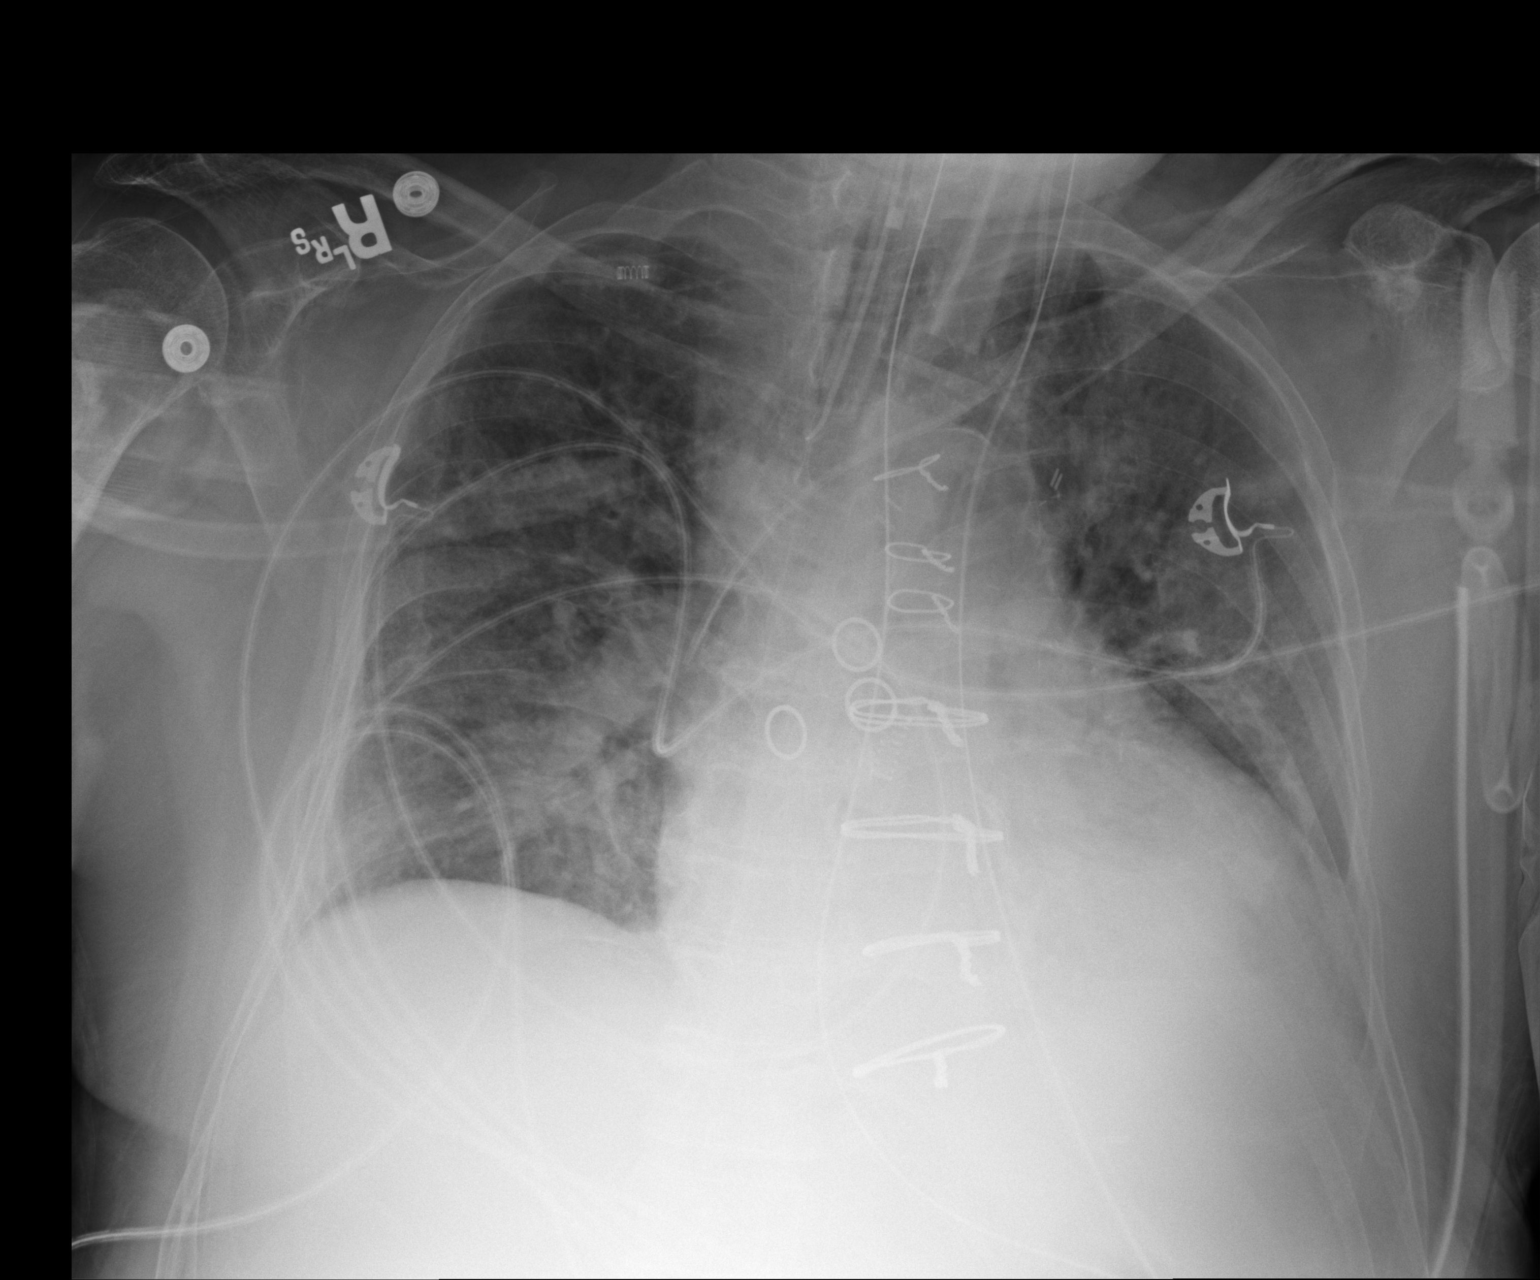

[1 of 1 positions shown; findings below may reference images not displayed]

FINDINGS: Endotracheal tube, NG tube, left IJ line stable position. Prior
CABG. Cardiomegaly with diffuse bilateral pulmonary infiltrates,
improved from prior exam. Small left pleural effusion. No
pneumothorax.
IMPRESSION: 1.  Lines and tubes in stable position.

2. Prior CABG. Cardiomegaly with diffuse bilateral pulmonary
infiltrates, slightly improved from prior exam suggesting slight
improvement of congestive heart failure. Bilateral pneumonia cannot
be excluded. Small left pleural effusion.

## 2017-05-15 IMAGING — CR DG CHEST 1V PORT
1 series · 1 of 1 positions shown · non-contrast
Comparison: Radiograph dated 09/10/2015

CLINICAL DATA: 69-year-old male with shortness of breath and
hypoxia.

EXAM:
PORTABLE CHEST 1 VIEW

[AP]
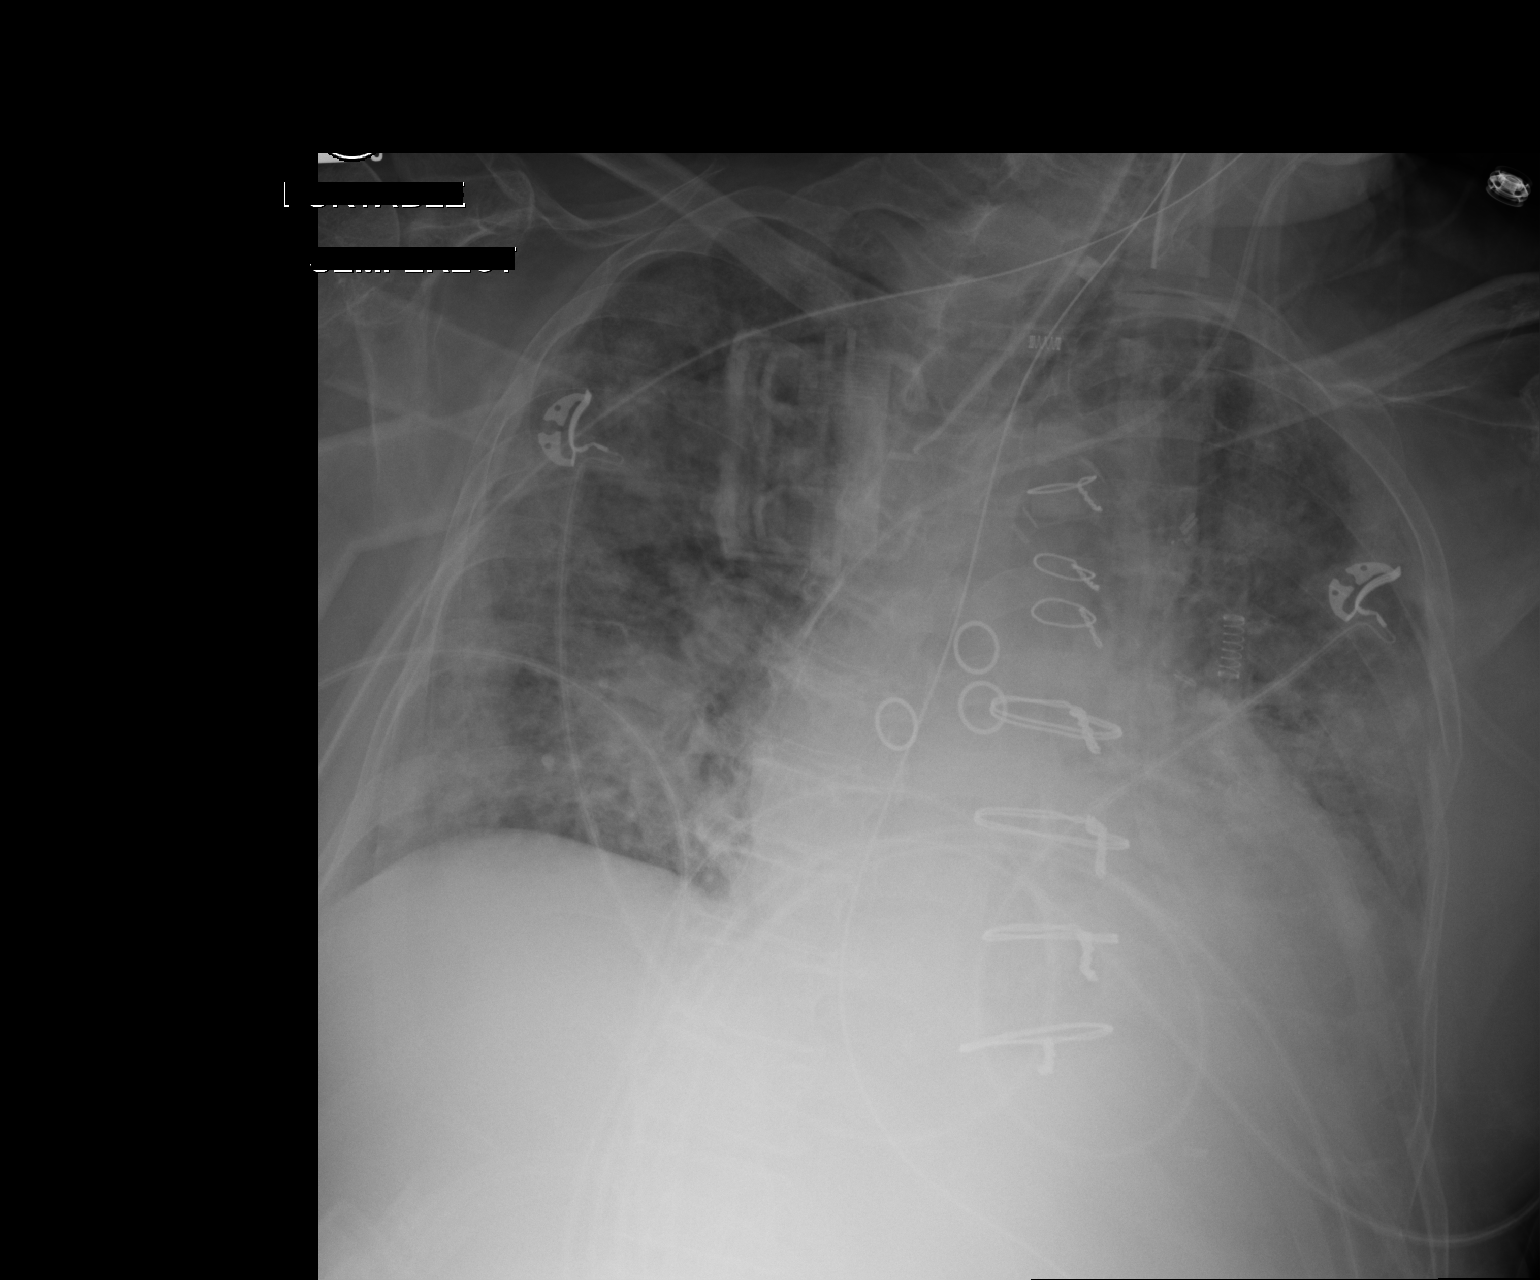

[1 of 1 positions shown; findings below may reference images not displayed]

FINDINGS: Endotracheal tube, and left IJ central line in stable positioning.
Partially visualized enteric tube. The patient is rotated. Bilateral
patchy airspace opacity again noted which appear slightly increased
compared to the prior study. There is stable cardiomegaly. Median
sternotomy wires and CABG vascular clips. Osseous structures appear
unremarkable.
IMPRESSION: Mild interval progression of the bilateral patchy airspace
opacities.

Support device in stable positioning.
